# Patient Record
Sex: Male | Born: 1970 | Race: White | Hispanic: No | Smoking: Former smoker
Health system: Southern US, Community
[De-identification: ages and names within clinical notes are randomized; demographics above are authoritative.]

## PROBLEM LIST (undated history)

## (undated) DIAGNOSIS — F909 Attention-deficit hyperactivity disorder, unspecified type: Secondary | ICD-10-CM

## (undated) DIAGNOSIS — E291 Testicular hypofunction: Secondary | ICD-10-CM

## (undated) DIAGNOSIS — R0789 Other chest pain: Secondary | ICD-10-CM

## (undated) DIAGNOSIS — D696 Thrombocytopenia, unspecified: Secondary | ICD-10-CM

## (undated) DIAGNOSIS — B009 Herpesviral infection, unspecified: Secondary | ICD-10-CM

## (undated) DIAGNOSIS — M549 Dorsalgia, unspecified: Secondary | ICD-10-CM

## (undated) DIAGNOSIS — R0602 Shortness of breath: Secondary | ICD-10-CM

## (undated) DIAGNOSIS — Z720 Tobacco use: Secondary | ICD-10-CM

## (undated) DIAGNOSIS — M1990 Unspecified osteoarthritis, unspecified site: Secondary | ICD-10-CM

## (undated) HISTORY — DX: Other chest pain: R07.89

## (undated) HISTORY — DX: Testicular hypofunction: E29.1

## (undated) HISTORY — PX: BACK SURGERY: SHX140

## (undated) HISTORY — DX: Tobacco use: Z72.0

## (undated) HISTORY — PX: VASECTOMY: SHX75

---

## 2000-12-22 ENCOUNTER — Emergency Department (HOSPITAL_COMMUNITY): Admission: EM | Admit: 2000-12-22 | Discharge: 2000-12-22 | Payer: Self-pay | Admitting: Emergency Medicine

## 2000-12-22 ENCOUNTER — Encounter: Payer: Self-pay | Admitting: Emergency Medicine

## 2001-06-14 ENCOUNTER — Encounter: Admission: RE | Admit: 2001-06-14 | Discharge: 2001-06-14 | Payer: Self-pay | Admitting: Neurosurgery

## 2001-06-14 ENCOUNTER — Encounter: Payer: Self-pay | Admitting: Neurosurgery

## 2001-07-08 ENCOUNTER — Encounter: Admission: RE | Admit: 2001-07-08 | Discharge: 2001-07-08 | Payer: Self-pay | Admitting: Neurosurgery

## 2001-07-08 ENCOUNTER — Encounter: Payer: Self-pay | Admitting: Neurosurgery

## 2001-08-08 ENCOUNTER — Encounter: Payer: Self-pay | Admitting: Neurosurgery

## 2001-08-09 ENCOUNTER — Inpatient Hospital Stay (HOSPITAL_COMMUNITY): Admission: RE | Admit: 2001-08-09 | Discharge: 2001-08-11 | Payer: Self-pay | Admitting: Neurosurgery

## 2001-09-08 ENCOUNTER — Encounter: Payer: Self-pay | Admitting: Neurosurgery

## 2001-09-08 ENCOUNTER — Encounter: Admission: RE | Admit: 2001-09-08 | Discharge: 2001-09-08 | Payer: Self-pay | Admitting: Neurosurgery

## 2001-11-14 ENCOUNTER — Encounter: Admission: RE | Admit: 2001-11-14 | Discharge: 2001-11-14 | Payer: Self-pay | Admitting: Neurosurgery

## 2001-11-14 ENCOUNTER — Encounter: Payer: Self-pay | Admitting: Neurosurgery

## 2002-08-17 ENCOUNTER — Emergency Department (HOSPITAL_COMMUNITY): Admission: EM | Admit: 2002-08-17 | Discharge: 2002-08-17 | Payer: Self-pay | Admitting: *Deleted

## 2003-08-06 ENCOUNTER — Encounter: Admission: RE | Admit: 2003-08-06 | Discharge: 2003-08-06 | Payer: Self-pay | Admitting: Neurosurgery

## 2003-12-27 ENCOUNTER — Emergency Department (HOSPITAL_COMMUNITY): Admission: EM | Admit: 2003-12-27 | Discharge: 2003-12-27 | Payer: Self-pay | Admitting: Emergency Medicine

## 2006-05-21 ENCOUNTER — Emergency Department (HOSPITAL_COMMUNITY): Admission: EM | Admit: 2006-05-21 | Discharge: 2006-05-22 | Payer: Self-pay | Admitting: Emergency Medicine

## 2008-05-04 ENCOUNTER — Emergency Department (HOSPITAL_COMMUNITY): Admission: EM | Admit: 2008-05-04 | Discharge: 2008-05-04 | Payer: Self-pay | Admitting: Emergency Medicine

## 2008-07-18 ENCOUNTER — Emergency Department (HOSPITAL_COMMUNITY): Admission: EM | Admit: 2008-07-18 | Discharge: 2008-07-18 | Payer: Self-pay | Admitting: Emergency Medicine

## 2010-05-12 ENCOUNTER — Emergency Department (HOSPITAL_COMMUNITY)
Admission: EM | Admit: 2010-05-12 | Discharge: 2010-05-12 | Payer: Self-pay | Source: Home / Self Care | Admitting: Emergency Medicine

## 2010-07-09 ENCOUNTER — Emergency Department (HOSPITAL_COMMUNITY)
Admission: EM | Admit: 2010-07-09 | Discharge: 2010-07-09 | Disposition: A | Discharge status: Home / Self Care | Payer: Self-pay | Attending: Emergency Medicine | Admitting: Emergency Medicine

## 2010-07-09 DIAGNOSIS — R059 Cough, unspecified: Secondary | ICD-10-CM | POA: Insufficient documentation

## 2010-07-09 DIAGNOSIS — F17200 Nicotine dependence, unspecified, uncomplicated: Secondary | ICD-10-CM | POA: Insufficient documentation

## 2010-07-09 DIAGNOSIS — J40 Bronchitis, not specified as acute or chronic: Secondary | ICD-10-CM | POA: Insufficient documentation

## 2010-07-09 DIAGNOSIS — H7290 Unspecified perforation of tympanic membrane, unspecified ear: Secondary | ICD-10-CM | POA: Insufficient documentation

## 2010-07-09 DIAGNOSIS — R05 Cough: Secondary | ICD-10-CM | POA: Insufficient documentation

## 2010-07-23 LAB — DIFFERENTIAL
Basophils Absolute: 0 10*3/uL (ref 0.0–0.1)
Basophils Relative: 0 % (ref 0–1)
Eosinophils Absolute: 0.1 10*3/uL (ref 0.0–0.7)
Eosinophils Relative: 1 % (ref 0–5)
Lymphocytes Relative: 18 % (ref 12–46)
Lymphs Abs: 1 10*3/uL (ref 0.7–4.0)
Monocytes Absolute: 0.2 10*3/uL (ref 0.1–1.0)
Monocytes Relative: 5 % (ref 3–12)
Neutro Abs: 4.2 10*3/uL (ref 1.7–7.7)
Neutrophils Relative %: 77 % (ref 43–77)

## 2010-07-23 LAB — POCT CARDIAC MARKERS
CKMB, poc: <1 ng/mL — ABNORMAL LOW (ref 1.0–8.0)
Myoglobin, poc: 102 ng/mL (ref 12–200)
Troponin i, poc: <0.05 ng/mL (ref 0.00–0.09)

## 2010-07-23 LAB — CBC
HCT: 45.8 % (ref 39.0–52.0)
Hemoglobin: 15.8 g/dL (ref 13.0–17.0)
MCHC: 34.4 g/dL (ref 30.0–36.0)
MCV: 87.2 fL (ref 78.0–100.0)
Platelets: 134 10*3/uL — ABNORMAL LOW (ref 150–400)
RBC: 5.26 MIL/uL (ref 4.22–5.81)
RDW: 13.5 % (ref 11.5–15.5)
WBC: 5.4 10*3/uL (ref 4.0–10.5)

## 2010-07-23 LAB — POCT I-STAT, CHEM 8
BUN: 20 mg/dL (ref 6–23)
Calcium, Ion: 1.18 mmol/L (ref 1.12–1.32)
Chloride: 105 mEq/L (ref 96–112)
Creatinine, Ser: 1.3 mg/dL (ref 0.4–1.5)
Glucose, Bld: 107 mg/dL — ABNORMAL HIGH (ref 70–99)
HCT: 48 % (ref 39.0–52.0)
Hemoglobin: 16.3 g/dL (ref 13.0–17.0)
Potassium: 4.1 mEq/L (ref 3.5–5.1)
Sodium: 140 mEq/L (ref 135–145)
TCO2: 27 mmol/L (ref 0–100)

## 2010-07-28 LAB — BASIC METABOLIC PANEL
BUN: 14 mg/dL (ref 6–23)
CO2: 26 mEq/L (ref 19–32)
Calcium: 9 mg/dL (ref 8.4–10.5)
Chloride: 100 mEq/L (ref 96–112)
Creatinine, Ser: 0.9 mg/dL (ref 0.4–1.5)
GFR calc Af Amer: >60 mL/min (ref >60)
GFR calc non Af Amer: >60 mL/min (ref >60)
Glucose, Bld: 82 mg/dL (ref 70–99)
Potassium: 4.4 mEq/L (ref 3.5–5.1)
Sodium: 136 mEq/L (ref 135–145)

## 2010-08-18 ENCOUNTER — Emergency Department (HOSPITAL_COMMUNITY)
Admission: EM | Admit: 2010-08-18 | Discharge: 2010-08-18 | Disposition: A | Discharge status: Home / Self Care | Payer: Self-pay | Attending: Emergency Medicine | Admitting: Emergency Medicine

## 2010-08-18 DIAGNOSIS — M545 Low back pain, unspecified: Secondary | ICD-10-CM | POA: Insufficient documentation

## 2010-08-18 DIAGNOSIS — T148XXA Other injury of unspecified body region, initial encounter: Secondary | ICD-10-CM | POA: Insufficient documentation

## 2010-08-18 DIAGNOSIS — H6690 Otitis media, unspecified, unspecified ear: Secondary | ICD-10-CM | POA: Insufficient documentation

## 2010-08-18 DIAGNOSIS — J329 Chronic sinusitis, unspecified: Secondary | ICD-10-CM | POA: Insufficient documentation

## 2010-08-18 DIAGNOSIS — X58XXXA Exposure to other specified factors, initial encounter: Secondary | ICD-10-CM | POA: Insufficient documentation

## 2010-08-29 NOTE — Op Note (Signed)
[facility]  Patient:    Dillon Macdonald, Dillon Macdonald Visit Number: [id number] MRN: [id number]          Service Type: DSU Location: [id number] Attending Physician:  Donn Pierini Dictated by:   Julio Sicks, M.D. Proc. 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  POSTOPERATIVE DIAGNOSIS:  L3-4 and L4-5  herniated nucleus pulposus with stenosis and radiculopathy.  OPERATION PERFORMED:  L3-4 and L4-5 decompressive lumbar laminectomies with foraminotomies.  L3-4 and L4-5 bilateral microdiskectomies.  L3-4 and L4-5 posterior lumbar interbody fusion utilizing tangent wedges and local autograft.  L3 through L5 posterolateral fusion utilizing segmental pedicle screw instrumentation and local autograft.  SURGEON:  Julio Sicks, M.D.  ASSISTANT:  Donalee Citrin, Montez Hageman.  ANESTHESIA:  General endotracheal.  INDICATIONS FOR PROCEDURE:  Mr. Attwood is a 40 year old male with a history of severe back pain with bilateral lower extremity symptoms failing conservative management.  MRI scanning demonstrates annular tearing with diffuse disk bulges and stenosis at L3-4 and L4-5.  These had been confirmed by CT myelography as well as discography. The disk herniation at L4-5 has been documented to be progressing.  The patient was counseled as to his options. He has decided to proceed with two level lumbar decompression and fusion surgery in hopes of improving some of his symptoms.  DESCRIPTION OF PROCEDURE:  The patient was taken to the operating room and placed on the operating table in supine position.  After an adequate level of anesthesia was achieved, the patient was positioned prone onto a Wilson frame and appropriately padded.  The patients lumbar region was shaved and prepped sterilely.  A 10 blade was used to make a linear skin  incision overlying the L2, L3, L4 and L5 levels.  This was carried down sharply in the midline. Subperiosteal dissection was performed exposing the lamina and facet joints of L2, L3, L4 and L5 as well as the transverse processes of L3, L4 and L5.  Deep self-retaining retractor was placed.  Intraoperative fluoroscopy was used and the level was confirmed.  Decompressive laminectomy was then performed at L3-4 and L4-5 using Kerrison rongeurs, high speed drill and Leksell rongeurs to completely remove the lamina of L3 and L4 as well as the superior aspect of the lamina of L5.  All bone was cleaned and used in later autografting.  The inferior facetectomy was then performed at L3 and L4 as well as superior facetectomies at L4 and L5.  Once again, all bone was cleaned and used in layer autografting.  Ligament was then elevated at both levels and resected in piecemeal fashion using Kerrison rongeurs.  A wide foraminotomy was then performed along the cords of the exiting, L3, L4, and L5 nerve roots. Epidural venous plexus was coagulated and cut.  Starting first at L3-4 with the  thecal  sac and nerve roots protected, the disk space was then incised with a 15 blade in rectangular fashion.  A wide disk space clean out was then achieved using pituitary rongeurs, upward angled pituitary rongeurs and Epstein curets.  The procedure was then repeated on the contralateral side and bilaterally at L5-S1.  Attention then placed to interbody fusion.  Starting first at L3-4 with the disk space distracted up  to 12 mm, the disk space was then reamed and cut with 12 mm tangent instruments.  All soft tissue was cleaned from the interspace.  A 12 x 26 mm tangent wedge was then impacted into place and recessed approximately 1 mm from the anterior cortical surface. The procedure was then repeated on the contralateral side. Prior to installation of the second tangent wedge, morselized autograft was packed into the  interspace which had been well prepared.  The second tangent wedge fractured upon placement and a replacement was put into place without difficulty.  Attention was then placed down to L4-5.  10 mm tangent wedges were then placed bilaterally and once again, the interspace was packed with autograft.  There were no complications with this aspect of the procedure. The wound was then irrigated.  Pedicles of L3, L4 and L5 were then isolated using surface landmarks.  Each pedicle was then probed using a pedicle awl. This was done under fluoroscopic guidance.  Each pedicle awl tract was found to be solidly within bone.  Each pedicle awl tract was then tapped with 5.25 mm screw tap.  6.75 x 45 mm spiral 90 screws were then placed at L3 and L4 bilaterally.  6.74 x 40 mm screws were placed at L5 bilaterally.  All six screws were found to be solidly within bone.  The transverse processes of L3, L4 and L5 were then decorticated using the high speed drill.  Morselized autograft was packed posterolaterally.  Titanium rod was then contoured and placed over the screw  heads at L3, L4 and L5.  This was then attached to the rod.  The construct was placed under compression and screw heads were given a final tightening at all levels.  Final images revealed good position of bone graft and hardware at the proper operative level with normal alignment of the spine.  The wound was then irrigated with antibiotic solution.  Gelfoam was placed topically for hemostasis.  A medium Hemovac drain was left in the epidural space.  The wound was then closed in layers with Vicryl sutures. Steri-Strips and sterile dressing were applied.  There were no apparent complications.  The patient tolerated the procedure well and she returned to the recovery room postoperatively. Dictated by:   Julio Sicks, M.D. Attending Physician:  Donn Pierini DD:  08/08/01 TD:  08/08/01 Job: [id number]

## 2010-10-22 ENCOUNTER — Emergency Department (HOSPITAL_COMMUNITY)
Admission: EM | Admit: 2010-10-22 | Discharge: 2010-10-23 | Disposition: A | Discharge status: Home / Self Care | Payer: Self-pay | Attending: Emergency Medicine | Admitting: Emergency Medicine

## 2010-10-22 DIAGNOSIS — S90569A Insect bite (nonvenomous), unspecified ankle, initial encounter: Secondary | ICD-10-CM | POA: Insufficient documentation

## 2010-10-22 DIAGNOSIS — R21 Rash and other nonspecific skin eruption: Secondary | ICD-10-CM | POA: Insufficient documentation

## 2010-10-22 DIAGNOSIS — W57XXXA Bitten or stung by nonvenomous insect and other nonvenomous arthropods, initial encounter: Secondary | ICD-10-CM | POA: Insufficient documentation

## 2011-03-14 ENCOUNTER — Emergency Department (HOSPITAL_COMMUNITY)
Admission: EM | Admit: 2011-03-14 | Discharge: 2011-03-14 | Disposition: A | Discharge status: Home / Self Care | Payer: Self-pay | Attending: Emergency Medicine | Admitting: Emergency Medicine

## 2011-03-14 ENCOUNTER — Encounter: Payer: Self-pay | Admitting: Adult Health

## 2011-03-14 ENCOUNTER — Emergency Department (HOSPITAL_COMMUNITY): Payer: Self-pay

## 2011-03-14 DIAGNOSIS — M549 Dorsalgia, unspecified: Secondary | ICD-10-CM | POA: Insufficient documentation

## 2011-03-14 DIAGNOSIS — G8929 Other chronic pain: Secondary | ICD-10-CM | POA: Insufficient documentation

## 2011-03-14 DIAGNOSIS — Z981 Arthrodesis status: Secondary | ICD-10-CM | POA: Insufficient documentation

## 2011-03-14 MED ORDER — DIAZEPAM 5 MG PO TABS: 5.0000 mg | ORAL_TABLET | Freq: Two times a day (BID) | ORAL | Status: AC

## 2011-03-14 MED ORDER — HYDROCODONE-ACETAMINOPHEN 5-325 MG PO TABS: 2.0000 | ORAL_TABLET | ORAL | Status: AC | PRN

## 2011-03-14 MED ORDER — DIAZEPAM 5 MG PO TABS: 5.0000 mg | ORAL_TABLET | Freq: Once | ORAL | Status: DC

## 2011-03-14 MED ORDER — IBUPROFEN 800 MG PO TABS: 800.0000 mg | ORAL_TABLET | Freq: Three times a day (TID) | ORAL | Status: AC

## 2011-03-14 MED ORDER — KETOROLAC TROMETHAMINE 60 MG/2ML IM SOLN
60.0000 mg | Freq: Once | INTRAMUSCULAR | Status: AC
  Administered 2011-03-14: 60 mg via INTRAMUSCULAR
  Filled 2011-03-14: qty 2

## 2011-03-14 NOTE — ED Notes (Signed)
Lumbar and mid back pain radiates down legs for 7 days.

## 2011-03-14 NOTE — ED Provider Notes (Signed)
History     CSN: [id number] Arrival date & time: 03/14/2011  8:58 AM   First MD Initiated Contact with Patient 03/14/11 [phone]      Chief Complaint  Patient presents with  . Back Pain    (Consider location/radiation/quality/duration/timing/severity/associated sxs/prior treatment) HPI Comments: Patient presents to the chief complaint of lumbar and thoracic back pain.  His pain is chronic in nature and he had a fusion of his T-spine performed by Dr. pool.  Patient states that he has pain radiating down both of his legs.  He denies numbness and is able to ambulate.  Patient also denies saddle paresthesias, urinary retention, and inability to control his bowel and bladder.  Patient states oxycodone makes him sick and does not want that pain medication.  Patient is a 40 y.o. male presenting with back pain. The history is provided by the patient.  Back Pain  Pertinent negatives include no chest pain, no fever, no numbness, no headaches, no abdominal pain and no weakness.    History reviewed. No pertinent past medical history.  Past Surgical History  Procedure Date  . Back surgery     History reviewed. No pertinent family history.  History  Substance Use Topics  . Smoking status: Current Everyday Smoker  . Smokeless tobacco: Not on file  . Alcohol Use: Yes      Review of Systems  Constitutional: Positive for activity change. Negative for fever, chills, fatigue and unexpected weight change.  HENT: Negative for neck pain and neck stiffness.   Eyes: Negative for visual disturbance.  Respiratory: Negative for shortness of breath.   Cardiovascular: Negative for chest pain and leg swelling.  Gastrointestinal: Negative for nausea, abdominal pain, constipation and rectal pain.  Genitourinary: Negative for urgency and difficulty urinating.       Patient denies bowel and bladder incontinence.  Musculoskeletal: Positive for back pain and gait problem. Negative for myalgias, joint swelling  and arthralgias.  Neurological: Negative for weakness, numbness and headaches.  All other systems reviewed and are negative.    Allergies  Review of patient's allergies indicates no known allergies.  Home Medications   Current Outpatient Rx  Name Route Sig Dispense Refill  . ASPIRIN 325 MG PO TBEC Oral Take 650 mg by mouth daily.        BP 130/86  Pulse 99  Temp(Src) 98.1 F (36.7 C) (Oral)  Resp 20  SpO2 99%  Physical Exam  Constitutional: He is oriented to person, place, and time. He appears well-developed and well-nourished. No distress.  HENT:  Head: Normocephalic and atraumatic.  Eyes: Conjunctivae and EOM are normal. Pupils are equal, round, and reactive to light. No scleral icterus.  Neck: Normal range of motion and full passive range of motion without pain. Neck supple. No tracheal tenderness, no spinous process tenderness and no muscular tenderness present. Carotid bruit is not present. No Brudzinski's sign noted. No mass and no thyromegaly present.  Cardiovascular: Normal rate, regular rhythm and intact distal pulses.  Exam reveals no gallop and no friction rub.   No murmur heard. Pulmonary/Chest: Effort normal and breath sounds normal. No stridor. No respiratory distress. He has no wheezes. He has no rales. He exhibits no tenderness.  Abdominal: Soft. Bowel sounds are normal.  Musculoskeletal:       Cervical back: He exhibits normal range of motion, no tenderness, no bony tenderness and no pain.       Thoracic back: He exhibits tenderness and bony tenderness. He exhibits no pain.  Lumbar back: He exhibits tenderness, bony tenderness and pain. He exhibits no spasm and normal pulse.       Right foot: He exhibits no swelling.       Left foot: He exhibits no swelling.       Pt has increased pain w ROM of lumbar spine. Pain w ambulation.   Neurological: He is alert and oriented to person, place, and time. He has normal strength. No sensory deficit.       2pt   discrimination intact of lower extremities.  No footdrop.  Pain with flexion and extension of back.  Distal pulses intact.  Skin: Skin is warm and dry. No rash noted. He is not diaphoretic. No erythema. No pallor.  Psychiatric: He has a normal mood and affect.    ED Course  Procedures (including critical care time)  Labs Reviewed - No data to display No results found.   No diagnosis found.  Patient advised to followup with Dr. Dutch Quint his orthopedic surgeon who performed the fusion of his lower lumbar.  Patient states his pain was relieved partially with a Toradol shot however he is driving home and did not want any narcotics.     MDM  Chronic Back Pain         Jaci Carrel, [place] 03/14/11 1122

## 2011-03-14 NOTE — ED Provider Notes (Signed)
Medical screening examination/treatment/procedure(s) were performed by non-physician practitioner and as supervising physician I was immediately available for consultation/collaboration.  Nicholes Stairs, MD 03/14/11 [phone]

## 2012-04-13 DIAGNOSIS — D696 Thrombocytopenia, unspecified: Secondary | ICD-10-CM

## 2012-04-13 HISTORY — PX: CERVICAL FUSION: SHX112

## 2012-04-13 HISTORY — DX: Thrombocytopenia, unspecified: D69.6

## 2012-11-18 ENCOUNTER — Telehealth: Payer: Self-pay

## 2012-11-18 ENCOUNTER — Telehealth: Payer: Self-pay | Admitting: Hematology and Oncology

## 2012-11-18 NOTE — Telephone Encounter (Signed)
C/D 11/18/12 for appt. 12/06/12

## 2012-11-18 NOTE — Telephone Encounter (Signed)
S/W PT IN RE TO NP APPT 08/26 @ 3 W/DR. VICTOR REFERRING DR. Rockney Ghee SHAW DX- LOW PLTS WELCOME PACKET MAILED

## 2012-12-06 ENCOUNTER — Ambulatory Visit (HOSPITAL_BASED_OUTPATIENT_CLINIC_OR_DEPARTMENT_OTHER): Payer: BC Managed Care – PPO | Admitting: Hematology and Oncology

## 2012-12-06 ENCOUNTER — Ambulatory Visit: Payer: BC Managed Care – PPO

## 2012-12-06 VITALS — BP 128/91 | HR 79 | Temp 97.5°F | Resp 18 | Ht 67.5 in | Wt 188.8 lb

## 2012-12-06 DIAGNOSIS — D696 Thrombocytopenia, unspecified: Secondary | ICD-10-CM

## 2012-12-06 NOTE — Progress Notes (Signed)
ID: [name] OB: 1971/01/30  MR#: [id number]  WUJ#:[id number]  [facility]  [phone] [phone]   INITIAL HEMATOLOGY CONSULTATION   PCP: REDMON,NOELLE, PA-C  Reason for Referral: Thrombocytopenia    HPI: The patient is a 42 y.o male who was send to the clinic because of thrombocytopenia. 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 Because of back pain patient was for surgery and presented today for clearance from hematology. The patient fells fatigue  all the time. His appetite is good and weight is stable. Patient reported nasal congestion, diminished hearing, weakness,pain in arms and hand, elbows, shoulders.  He also has numbness in hands and right leg. The patient denied fever, chills, night sweats. He denied headaches, double vision, blurry vision, nasal discharge,  odynophagia or dysphagia. No chest pain, palpitations, dyspnea, cough, abdominal pain, nausea, vomiting, diarrhea, constipation, hematochezia. The patient denied dysuria, nocturia, polyuria, hematuria, myalgia, tingling, psychiatric problems.  Review of Systems  Constitutional: Positive for malaise/fatigue. Negative for fever, chills, weight loss and diaphoresis.  HENT: Positive for congestion. Negative for hearing loss, nosebleeds, sore throat, neck pain and tinnitus.   Eyes: Negative for blurred vision, double vision, photophobia and pain.  Respiratory: Negative for cough, hemoptysis, sputum production, shortness of breath and wheezing.   Cardiovascular: Negative for chest pain, palpitations, orthopnea, claudication, leg swelling and PND.  Gastrointestinal: Negative for heartburn, nausea, vomiting, abdominal pain,  diarrhea, constipation, blood in stool and melena.  Genitourinary: Negative for dysuria, urgency, frequency, hematuria and flank pain.  Musculoskeletal: Positive for myalgias and joint pain. Negative for back pain.  Skin: Negative for itching and rash.  Neurological: Positive for sensory change and weakness. Negative for dizziness, tingling, tremors, speech change, focal weakness, seizures, loss of consciousness and headaches.  Endo/Heme/Allergies: Does not bruise/bleed easily.  Psychiatric/Behavioral: Negative.     PAST MEDICAL HISTORY: No past medical history on file. HSV infection Back pain. Erection problem  PAST SURGICAL HISTORY: Past Surgical History  Procedure Laterality Date  . Back surgery    Vasectomy.  FAMILY HISTORY No family history on file. Mother: DM, HTN, Hypercholesteremia. Father: DM, HTN, CVA. Brother DM, HTN, CAD, Hypercholesteremia. Brother HTN, Hypercholesteremia. Sister: O>K>  HEALTH MAINTENANCE: History  Substance Use Topics  . Smoking status: Current Every Day Smoker  . Smokeless tobacco: Not on file  . Alcohol Use: Yes  Smoke 2 PPD.  No Known Allergies  Current Outpatient Prescriptions  Medication Sig Dispense Refill  . aspirin 325 MG EC tablet Take 650 mg by mouth daily.        Marland Kitchen HYDROcodone-acetaminophen (NORCO/VICODIN) 5-325 MG per tablet Take 1 tablet by mouth 2 (two) times daily as needed for pain.       No current facility-administered medications for this visit.    OBJECTIVE: Filed Vitals:   12/06/12 1622  BP: 128/91  Pulse: 79  Temp: 97.5 F (36.4 C)  Resp: 18     Body mass index is 29.12 kg/(m^2).    ECOG FS: 0  PHYSICAL EXAMINATION:  HEENT: Sclerae anicteric.  Conjunctivae were pink. Pupils round and reactive bilaterally. Oral mucosa is moist without ulceration or thrush. No occipital, submandibular, cervical, supraclavicular or axillar adenopathy. Lungs: clear  to auscultation without wheezes. No rales or rhonchi. Heart:  regular rate and rhythm. No murmur, gallop or rubs. Abdomen: soft, non tender. No guarding or rebound tenderness. Bowel sounds are present. No palpable hepatosplenomegaly. MSK: no focal spinal tenderness. Extremities: No clubbing or cyanosis.No calf tenderness to palpitation, no peripheral edema. The patient had grossly intact strength in upper and lower extremities. Skin exam was without ecchymosis, petechiae. Neuro: non-focal, alert and oriented to time, person and place, appropriate affect  LAB RESULTS:  CMP     Component Value Date/Time   NA 140 07/18/2008 1215   K 4.1 07/18/2008 1215   CL 105 07/18/2008 1215   CO2 26 05/04/2008 1730   GLUCOSE 107* 07/18/2008 1215   BUN 20 07/18/2008 1215   CREATININE 1.3 07/18/2008 1215   CALCIUM 9.0 05/04/2008 1730   GFRNONAA >60 05/04/2008 1730   GFRAA  Value: >60        The eGFR has been calculated using the MDRD equation. This calculation has not been validated in all clinical situations. eGFR's persistently <60 mL/min signify possible Chronic Kidney Disease. 05/04/2008 1730    Lab Results  Component Value Date   WBC 5.4 07/18/2008   NEUTROABS 4.2 07/18/2008   HGB 16.3 07/18/2008   HCT 48.0 07/18/2008   MCV 87.2 07/18/2008   PLT 134* 07/18/2008      Chemistry      Component Value Date/Time   NA 140 07/18/2008 1215   K 4.1 07/18/2008 1215   CL 105 07/18/2008 1215   CO2 26 05/04/2008 1730   BUN 20 07/18/2008 1215   CREATININE 1.3 07/18/2008 1215      Component Value Date/Time   CALCIUM 9.0 05/04/2008 1730      ASSESSMENT AND PLAN:  1. Thrombocytopenia. Probably temporary secondary to infection and, may be partially to medications. We will repeat CBC today and if it will be abnormal we will check serum folate, vitamin B12, ferritin, iron,TIBC. I did not felt spleen but we will do [place] to r/o splenomegaly. I will se patient in 2 weeks.   Myra Rude, MD   12/06/2012 5:23 PM

## 2012-12-08 ENCOUNTER — Ambulatory Visit (HOSPITAL_BASED_OUTPATIENT_CLINIC_OR_DEPARTMENT_OTHER): Payer: BC Managed Care – PPO | Admitting: Lab

## 2012-12-08 ENCOUNTER — Telehealth: Payer: Self-pay | Admitting: Hematology and Oncology

## 2012-12-08 ENCOUNTER — Ambulatory Visit (HOSPITAL_COMMUNITY)
Admission: RE | Admit: 2012-12-08 | Discharge: 2012-12-08 | Disposition: A | Discharge status: Home / Self Care | Payer: BC Managed Care – PPO | Source: Ambulatory Visit | Attending: Hematology and Oncology | Admitting: Hematology and Oncology

## 2012-12-08 DIAGNOSIS — D696 Thrombocytopenia, unspecified: Secondary | ICD-10-CM

## 2012-12-08 LAB — CBC WITH DIFFERENTIAL/PLATELET
BASO%: 1.1 % (ref 0.0–2.0)
Basophils Absolute: 0.1 10*3/uL (ref 0.0–0.1)
EOS%: 3.5 % (ref 0.0–7.0)
Eosinophils Absolute: 0.2 10*3/uL (ref 0.0–0.5)
HCT: 44.4 % (ref 38.4–49.9)
HGB: 15.2 g/dL (ref 13.0–17.1)
LYMPH%: 33.4 % (ref 14.0–49.0)
MCH: 30.6 pg (ref 27.2–33.4)
MCHC: 34.3 g/dL (ref 32.0–36.0)
MCV: 89.4 fL (ref 79.3–98.0)
MONO#: 0.4 10*3/uL (ref 0.1–0.9)
MONO%: 7.3 % (ref 0.0–14.0)
NEUT#: 2.7 10*3/uL (ref 1.5–6.5)
NEUT%: 54.7 % (ref 39.0–75.0)
Platelets: 153 10*3/uL (ref 140–400)
RBC: 4.96 10*6/uL (ref 4.20–5.82)
RDW: 13.6 % (ref 11.0–14.6)
WBC: 4.9 10*3/uL (ref 4.0–10.3)
lymph#: 1.6 10*3/uL (ref 0.9–3.3)

## 2012-12-08 LAB — IRON AND TIBC CHCC
%SAT: 36 % (ref 20–55)
Iron: 109 ug/dL (ref 42–163)
TIBC: 306 ug/dL (ref 202–409)
UIBC: 197 ug/dL (ref 117–376)

## 2012-12-08 LAB — VITAMIN B12: Vitamin B-12: 564 pg/mL (ref 211–911)

## 2012-12-08 LAB — FERRITIN CHCC: Ferritin: 145 ng/ml (ref 22–316)

## 2012-12-08 LAB — FOLATE: Folate: >20 ng/mL

## 2012-12-08 NOTE — Telephone Encounter (Signed)
Pt came by today fo draw labs , CAmeo aware  that pt is here

## 2012-12-08 NOTE — Telephone Encounter (Signed)
s.w. pt and advised on 2week appts....pt ok and aware

## 2012-12-18 ENCOUNTER — Encounter (HOSPITAL_COMMUNITY): Payer: Self-pay

## 2012-12-18 ENCOUNTER — Emergency Department (HOSPITAL_COMMUNITY)
Admission: EM | Admit: 2012-12-18 | Discharge: 2012-12-18 | Disposition: A | Discharge status: Home / Self Care | Payer: BC Managed Care – PPO | Attending: Emergency Medicine | Admitting: Emergency Medicine

## 2012-12-18 DIAGNOSIS — Z8739 Personal history of other diseases of the musculoskeletal system and connective tissue: Secondary | ICD-10-CM | POA: Insufficient documentation

## 2012-12-18 DIAGNOSIS — S0120XA Unspecified open wound of nose, initial encounter: Secondary | ICD-10-CM | POA: Insufficient documentation

## 2012-12-18 DIAGNOSIS — S0121XA Laceration without foreign body of nose, initial encounter: Secondary | ICD-10-CM

## 2012-12-18 DIAGNOSIS — IMO0002 Reserved for concepts with insufficient information to code with codable children: Secondary | ICD-10-CM | POA: Insufficient documentation

## 2012-12-18 DIAGNOSIS — Z7982 Long term (current) use of aspirin: Secondary | ICD-10-CM | POA: Insufficient documentation

## 2012-12-18 DIAGNOSIS — Y929 Unspecified place or not applicable: Secondary | ICD-10-CM | POA: Insufficient documentation

## 2012-12-18 DIAGNOSIS — F17200 Nicotine dependence, unspecified, uncomplicated: Secondary | ICD-10-CM | POA: Insufficient documentation

## 2012-12-18 DIAGNOSIS — Y9389 Activity, other specified: Secondary | ICD-10-CM | POA: Insufficient documentation

## 2012-12-18 HISTORY — DX: Dorsalgia, unspecified: M54.9

## 2012-12-18 MED ORDER — HYDROCODONE-ACETAMINOPHEN 5-325 MG PO TABS
2.0000 | ORAL_TABLET | Freq: Once | ORAL | Status: AC
Start: 2012-12-18 — End: 2012-12-18
  Administered 2012-12-18: 2 via ORAL
  Filled 2012-12-18: qty 2

## 2012-12-18 MED ORDER — ONDANSETRON HCL 4 MG PO TABS: 4.0000 mg | ORAL_TABLET | Freq: Four times a day (QID) | ORAL | Status: DC

## 2012-12-18 MED ORDER — ONDANSETRON 8 MG PO TBDP
8.0000 mg | ORAL_TABLET | Freq: Once | ORAL | Status: AC
  Administered 2012-12-18: 8 mg via ORAL
  Filled 2012-12-18: qty 1

## 2012-12-18 MED ORDER — OXYCODONE-ACETAMINOPHEN 5-325 MG PO TABS: 2.0000 | ORAL_TABLET | Freq: Four times a day (QID) | ORAL | Status: DC | PRN

## 2012-12-18 NOTE — ED Notes (Signed)
Pt reports accidental contact with metal object to face cutting nose. 1/2 inch vertical laceration to the left fold of nose EDP Lynelle Doctor present to evaluate present laceration. Bleeding controlled

## 2012-12-18 NOTE — ED Notes (Signed)
MD at bedside.  EDPA Dahlia Client present at bs applying dermabond to left nostril

## 2012-12-18 NOTE — ED Provider Notes (Signed)
Medical screening examination/treatment/procedure(s) were conducted as a shared visit with non-physician practitioner(s) and myself.  I personally evaluated the patient during the encounter  SMall laceration nasolabial fold.  Well approximated.  Laceration is less than 1 cm curvilinear at nasolabial fold.  Will dermabond for wound closure.  Celene Kras, MD 12/18/12 [phone]

## 2012-12-18 NOTE — ED Provider Notes (Signed)
CSN: [id number]     Arrival date & time 12/18/12  1457 History  This chart was scribed for non-physician practitioner Junious Silk, working with Linwood Dibbles, MD by Carl Best, ED Scribe. This patient was seen in room WTR5/WTR5 and the patient's care was started at 3:31 PM.   Chief Complaint  Patient presents with  . Facial Laceration    Patient is a 42 y.o. male presenting with skin laceration. The history is provided by the patient. No language interpreter was used.  Laceration Location:  Face Facial laceration location:  Nose Depth:  Cutaneous Bleeding: controlled   Time since incident:  8 hours Injury mechanism: pliers. Pain details:    Severity:  Mild   Progression:  Unchanged Foreign body present:  No foreign bodies Worsened by:  Nothing tried Ineffective treatments:  None tried Tetanus status:  Up to date  HPI Comments: Dillon Macdonald is a 42 y.o. male who presents to the Emergency Department complaining a laceration to his left nare that occurred at 10:30 AM this morning.  He stated that he was pulling on a spring with a pair of pliers and the pliers hit him directly in the face.  He stated that there is associated soreness and pressure around the left nare and the top of his braces.  He denied any dental injury or pain in the bridge of his nose.  He stated that he was seen at a [facility] and was advised to come to the ED to see a Careers adviser.  Patient stated that his last TD vaccination was about a month ago.    Past Medical History  Diagnosis Date  . Back pain    Past Surgical History  Procedure Laterality Date  . Back surgery     No family history on file. History  Substance Use Topics  . Smoking status: Current Every Day Smoker  . Smokeless tobacco: Not on file  . Alcohol Use: Yes    Review of Systems  HENT: Negative for sinus pressure.        Pressure around the left nare and face  Skin: Positive for wound (left nare).  All other systems reviewed and are  negative.    Allergies  Review of patient's allergies indicates no known allergies.  Home Medications   Current Outpatient Rx  Name  Route  Sig  Dispense  Refill  . aspirin 325 MG EC tablet   Oral   Take 650 mg by mouth daily.           Marland Kitchen HYDROcodone-acetaminophen (NORCO/VICODIN) 5-325 MG per tablet   Oral   Take 1 tablet by mouth 2 (two) times daily as needed for pain.         Marland Kitchen ondansetron (ZOFRAN) 4 MG tablet   Oral   Take 1 tablet (4 mg total) by mouth every 6 (six) hours.   12 tablet   0   . oxyCODONE-acetaminophen (PERCOCET/ROXICET) 5-325 MG per tablet   Oral   Take 2 tablets by mouth every 6 (six) hours as needed for pain.   12 tablet   0    Triage Vitals: BP 135/85  Pulse 92  Temp(Src) 99.2 F (37.3 C) (Oral)  Resp 16  Ht 5\' 8"  (1.727 m)  Wt 188 lb (85.276 kg)  BMI 28.59 kg/m2  SpO2 99%  Physical Exam  Nursing note and vitals reviewed. Constitutional: He is oriented to person, place, and time. He appears well-developed and well-nourished. No distress.  HENT:  Head: Normocephalic.  Right Ear: External ear normal.  Left Ear: External ear normal.  Laceration to the crease of the left nare.  No gum lacerations, broken or loose teeth.  No tenderness to palpation at the bridge of the nose, maxillary or frontal sinuses.  Eyes: Conjunctivae and EOM are normal. Pupils are equal, round, and reactive to light.  Neck: Normal range of motion and phonation normal. Neck supple.  Pulmonary/Chest: He exhibits no bony tenderness.  Abdominal: Normal appearance.  Musculoskeletal: Normal range of motion.  Neurological: He is alert and oriented to person, place, and time. He has normal strength. No sensory deficit.  Skin: Skin is warm, dry and intact. He is not diaphoretic.  Psychiatric: He has a normal mood and affect. His behavior is normal. Judgment and thought content normal.    ED Course  Procedures (including critical care time)  DIAGNOSTIC STUDIES: Oxygen  Saturation is 99% on room air, normal by my interpretation.    COORDINATION OF CARE: 3:39 PM- Discussed the absence of hematoma around the area and cleaning up the effected area before discharging.  Patient agreed to treatment plan.   3:40 PM- Supervising physician, Linwood Dibbles, spoke with patient and advised that surgery consult will not be necessary.    LACERATION REPAIR Performed by: Junious Silk Authorized by: Junious Silk Consent: Verbal consent obtained. Risks and benefits: risks, benefits and alternatives were discussed Consent given by: patient Patient identity confirmed: provided demographic data Prepped and Draped in normal sterile fashion Wound explored  Laceration Location: left nare  Laceration Length: .5 cm  No Foreign Bodies seen or palpated  Anesthesia: none  Irrigation method: syringe Amount of cleaning: standard  Skin closure: dermabond   Patient tolerance: Patient tolerated the procedure well with no immediate complications.   Medications  HYDROcodone-acetaminophen (NORCO/VICODIN) 5-325 MG per tablet 2 tablet (not administered)  ondansetron (ZOFRAN-ODT) disintegrating tablet 8 mg (not administered)    Labs Review Labs Reviewed - No data to display Imaging Review No results found.  MDM   1. Nasal laceration, initial encounter    Tdap booster UTD. Wound cleaning complete with pressure irrigation, bottom of wound visualized, no foreign bodies appreciated. Nasal fold intact. Laceration occurred < 8 hours prior to repair which was well tolerated. Pt has no co morbidities to effect normal wound healing. Discussed skin adhesive home care w pt and answered questions. F/u with pcp. Pt is hemodynamically stable w no complaints prior to dc.    I personally performed the services described in this documentation, which was scribed in my presence. The recorded information has been reviewed and is accurate.     Mora Bellman, PA-C 12/27/12 [phone]

## 2012-12-21 ENCOUNTER — Ambulatory Visit (HOSPITAL_BASED_OUTPATIENT_CLINIC_OR_DEPARTMENT_OTHER): Payer: BC Managed Care – PPO | Admitting: Hematology and Oncology

## 2012-12-21 VITALS — BP 128/88 | HR 73 | Temp 97.9°F | Resp 18 | Ht 68.0 in | Wt 189.1 lb

## 2012-12-21 DIAGNOSIS — D696 Thrombocytopenia, unspecified: Secondary | ICD-10-CM

## 2012-12-21 NOTE — Progress Notes (Signed)
ID: Dillon Macdonald OB: 1970/11/25  MR#: [id number]  WUJ#:[id number]   [facility]  Telephone:(336) [phone] Fax:(336) [phone]   OFFICE PROGRESS NOTE  PCP: REDMON,NOELLE, PA-C  DIAGNOSIS: History of thrombocytopenia   HISTORY OF PRESENT ILLNESS: The patient is a 42 y.o male who was send to the clinic because of thrombocytopenia. 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 He was started on valacyclovir, Voltaren and Viagra. Second CBC on 08/04/20143 was also normal except platelets count 129,000. Because of back pain patient was planned for surgery. He presented on 12/06/12 for clearance from hematology. He presented today for follow up visit.  INTERVAL HISTORY: Dillon Macdonald is a 43 y.o. Man who presented for follow up visit. His appetite is good but he feels tired. He has headache all the time. He reported nasal congestion. Patient has dry cough which is improving. He complains on pain all over, numbness in hands and right leg. He feels weak. The patient denied fever, chills, night sweats, change in appetite or weight. He denied double vision, blurry vision, nasal discharge, hearing problems, odynophagia or dysphagia. No chest pain, palpitations, dyspnea, cough, abdominal pain, nausea, vomiting, diarrhea, constipation, hematochezia. The patient denied dysuria, nocturia, polyuria, hematuria, myalgia, tingling, psychiatric problems.  Review of Systems  Constitutional: Positive for malaise/fatigue. Negative for fever, chills, weight loss and diaphoresis.  HENT: Positive for neck pain. Negative for hearing loss, nosebleeds, congestion, sore throat and tinnitus.   Eyes: Negative for blurred vision, double vision, photophobia and pain.  Respiratory: Negative for cough, hemoptysis, sputum production, shortness of breath and wheezing.     Cardiovascular: Negative for chest pain, palpitations, orthopnea, claudication, leg swelling and PND.  Gastrointestinal: Negative for heartburn, nausea, vomiting, abdominal pain, diarrhea, constipation, blood in stool and melena.  Genitourinary: Negative for dysuria, urgency, frequency, hematuria and flank pain.  Musculoskeletal: Positive for back pain and joint pain. Negative for myalgias and falls.  Skin: Negative for itching and rash.  Neurological: Positive for sensory change, weakness and headaches. Negative for dizziness, tingling, tremors, speech change, focal weakness, seizures and loss of consciousness.  Endo/Heme/Allergies: Does not bruise/bleed easily.  Psychiatric/Behavioral: Negative.    PAST MEDICAL HISTORY: Past Medical History  Diagnosis Date  . Back pain   HSV infection  Erection problem   PAST SURGICAL HISTORY: Past Surgical History  Procedure Laterality Date  . Back surgery    Vasectomy.  FAMILY HISTORY No family history on file. Mother: DM, HTN, Hypercholesteremia.  Father: DM, HTN, CVA.  Brother DM, HTN, CAD, Hypercholesteremia.  Brother HTN, Hypercholesteremia.  Sister: O>K>  HEALTH MAINTENANCE: History  Substance Use Topics  . Smoking status: Current Every Day Smoker  . Smokeless tobacco: Not on file  . Alcohol Use: Yes   No Known Allergies  Current Outpatient Prescriptions  Medication Sig Dispense Refill  . aspirin 325 MG EC tablet Take 650 mg by mouth daily.        Marland Kitchen HYDROcodone-acetaminophen (NORCO/VICODIN) 5-325 MG per tablet Take 1 tablet by mouth 2 (two) times daily as needed for pain.      Marland Kitchen ondansetron (ZOFRAN) 4 MG tablet Take 1 tablet (4 mg total) by mouth every 6 (six) hours.  12 tablet  0  . oxyCODONE-acetaminophen (PERCOCET/ROXICET) 5-325 MG per tablet Take 2 tablets by mouth every 6 (six) hours as needed for pain.  12 tablet  0   No current facility-administered medications for this visit.    OBJECTIVE: Filed Vitals:    12/21/12 1329  BP: 128/88  Pulse: 73  Temp: 97.9 F (36.6 C)  Resp: 18     Body mass index is 28.76 kg/(m^2).    ECOG FS:0  PHYSICAL EXAMINATION:  HEENT: Sclerae anicteric.  Conjunctivae were pink. Pupils round and reactive bilaterally. Oral mucosa is moist without ulceration or thrush. No occipital, submandibular, cervical, supraclavicular or axillar adenopathy. Lungs: clear to auscultation without wheezes. No rales or rhonchi. Heart: regular rate and rhythm. No murmur, gallop or rubs. Abdomen: soft, non tender. No guarding or rebound tenderness. Bowel sounds are present. No palpable hepatosplenomegaly. MSK: no focal spinal tenderness. Extremities: No clubbing or cyanosis.No calf tenderness to palpitation, no peripheral edema. The patient had grossly intact strength in upper and lower extremities. Skin exam was without ecchymosis, petechiae. Neuro: non-focal, alert and oriented to time, person and place, appropriate affect  LAB RESULTS:  CMP     Component Value Date/Time   NA 140 07/18/2008 1215   K 4.1 07/18/2008 1215   CL 105 07/18/2008 1215   CO2 26 05/04/2008 1730   GLUCOSE 107* 07/18/2008 1215   BUN 20 07/18/2008 1215   CREATININE 1.3 07/18/2008 1215   CALCIUM 9.0 05/04/2008 1730   GFRNONAA >60 05/04/2008 1730   GFRAA  Value: >60        The eGFR has been calculated using the MDRD equation. This calculation has not been validated in all clinical situations. eGFR's persistently <60 mL/min signify possible Chronic Kidney Disease. 05/04/2008 1730    Lab Results  Component Value Date   WBC 4.9 12/08/2012   NEUTROABS 2.7 12/08/2012   HGB 15.2 12/08/2012   HCT 44.4 12/08/2012   MCV 89.4 12/08/2012   PLT 153 12/08/2012      Chemistry      Component Value Date/Time   NA 140 07/18/2008 1215   K 4.1 07/18/2008 1215   CL 105 07/18/2008 1215   CO2 26 05/04/2008 1730   BUN 20 07/18/2008 1215   CREATININE 1.3 07/18/2008 1215      Component Value Date/Time   CALCIUM 9.0 05/04/2008 1730       STUDIES: US Abdomen Limited  12/08/2012   *RADIOLOGY REPORT*  Clinical Data:  History of thrombocytopenia.  Evaluation of spleen.  LIMITED ABDOMINAL ULTRASOUND - RIGHT UPPER QUADRANT  Comparison:  None  Findings:  Spleen: The spleen measured 12.4 x 8.3 x 5.2 cm.  Volume is 281 ml. This is within normal range.  No focal splenic abnormality is seen.  IMPRESSION:   Normal size of spleen.  Volume is 281 ml.  No focal splenic abnormality evident.   Original Report Authenticated By: Onalee Hua Call    ASSESSMENT AND PLAN: 1. History of thrombocytopenia. Probably secondary to infection and medication, Last CBC was completely normal. Workup: serum folate, vitamin B12, iron study - all normal. No splenomegaly He can go for surgery. No need to follow with [place].  Myra Rude, MD   12/21/2012 3:08 PM

## 2012-12-28 NOTE — ED Provider Notes (Signed)
Medical screening examination/treatment/procedure(s) were performed by non-physician practitioner and as supervising physician I was immediately available for consultation/collaboration.    Keigo Whalley R Aaima Gaddie, MD 12/28/12 1633 

## 2013-03-20 ENCOUNTER — Other Ambulatory Visit: Payer: Self-pay | Admitting: Neurosurgery

## 2013-03-20 DIAGNOSIS — M48061 Spinal stenosis, lumbar region without neurogenic claudication: Secondary | ICD-10-CM

## 2013-03-31 ENCOUNTER — Ambulatory Visit
Admission: RE | Admit: 2013-03-31 | Discharge: 2013-03-31 | Disposition: A | Discharge status: Home / Self Care | Payer: BC Managed Care – PPO | Source: Ambulatory Visit | Attending: Neurosurgery | Admitting: Neurosurgery

## 2013-03-31 VITALS — BP 111/77 | HR 83

## 2013-03-31 DIAGNOSIS — M48061 Spinal stenosis, lumbar region without neurogenic claudication: Secondary | ICD-10-CM

## 2013-03-31 MED ORDER — DIAZEPAM 5 MG PO TABS
10.0000 mg | ORAL_TABLET | Freq: Once | ORAL | Status: AC
  Administered 2013-03-31: 10 mg via ORAL

## 2013-03-31 MED ORDER — IOHEXOL 300 MG/ML  SOLN
10.0000 mL | Freq: Once | INTRAMUSCULAR | Status: AC | PRN
  Administered 2013-03-31: 10 mL via INTRATHECAL

## 2013-03-31 MED ORDER — ONDANSETRON HCL 4 MG/2ML IJ SOLN: 4.0000 mg | Freq: Four times a day (QID) | INTRAMUSCULAR | Status: DC | PRN

## 2013-03-31 MED ORDER — OXYCODONE-ACETAMINOPHEN 5-325 MG PO TABS
2.0000 | ORAL_TABLET | Freq: Once | ORAL | Status: AC
  Administered 2013-03-31: 2 via ORAL

## 2013-10-10 ENCOUNTER — Other Ambulatory Visit: Payer: Self-pay | Admitting: Neurosurgery

## 2013-10-23 ENCOUNTER — Encounter (HOSPITAL_COMMUNITY): Payer: Self-pay | Admitting: Pharmacy Technician

## 2013-10-24 ENCOUNTER — Encounter (HOSPITAL_COMMUNITY)
Admission: RE | Admit: 2013-10-24 | Discharge: 2013-10-24 | Disposition: A | Discharge status: Home / Self Care | Payer: BC Managed Care – PPO | Source: Ambulatory Visit | Attending: Anesthesiology | Admitting: Anesthesiology

## 2013-10-24 ENCOUNTER — Encounter (HOSPITAL_COMMUNITY): Payer: Self-pay

## 2013-10-24 ENCOUNTER — Encounter (HOSPITAL_COMMUNITY)
Admission: RE | Admit: 2013-10-24 | Discharge: 2013-10-24 | Disposition: A | Discharge status: Home / Self Care | Payer: BC Managed Care – PPO | Source: Ambulatory Visit | Attending: Neurosurgery | Admitting: Neurosurgery

## 2013-10-24 DIAGNOSIS — Z01818 Encounter for other preprocedural examination: Secondary | ICD-10-CM | POA: Insufficient documentation

## 2013-10-24 DIAGNOSIS — Z01812 Encounter for preprocedural laboratory examination: Secondary | ICD-10-CM | POA: Insufficient documentation

## 2013-10-24 HISTORY — DX: Shortness of breath: R06.02

## 2013-10-24 HISTORY — DX: Thrombocytopenia, unspecified: D69.6

## 2013-10-24 HISTORY — DX: Herpesviral infection, unspecified: B00.9

## 2013-10-24 LAB — CBC
HCT: 42.9 % (ref 39.0–52.0)
Hemoglobin: 14.3 g/dL (ref 13.0–17.0)
MCH: 30.2 pg (ref 26.0–34.0)
MCHC: 33.3 g/dL (ref 30.0–36.0)
MCV: 90.5 fL (ref 78.0–100.0)
Platelets: 151 10*3/uL (ref 150–400)
RBC: 4.74 MIL/uL (ref 4.22–5.81)
RDW: 13.7 % (ref 11.5–15.5)
WBC: 5.5 10*3/uL (ref 4.0–10.5)

## 2013-10-24 LAB — BASIC METABOLIC PANEL
Anion gap: 12 (ref 5–15)
BUN: 20 mg/dL (ref 6–23)
CO2: 24 mEq/L (ref 19–32)
Calcium: 9.2 mg/dL (ref 8.4–10.5)
Chloride: 103 mEq/L (ref 96–112)
Creatinine, Ser: 0.89 mg/dL (ref 0.50–1.35)
GFR calc Af Amer: >90 mL/min (ref >90)
GFR calc non Af Amer: >90 mL/min (ref >90)
Glucose, Bld: 99 mg/dL (ref 70–99)
Potassium: 4.6 mEq/L (ref 3.7–5.3)
Sodium: 139 mEq/L (ref 137–147)

## 2013-10-24 LAB — TYPE AND SCREEN
ABO/RH(D): A POS
Antibody Screen: NEGATIVE

## 2013-10-24 LAB — SURGICAL PCR SCREEN
MRSA, PCR: NEGATIVE
Staphylococcus aureus: NEGATIVE

## 2013-10-24 LAB — ABO/RH: ABO/RH(D): A POS

## 2013-10-24 NOTE — Pre-Procedure Instructions (Signed)
Dillon PellegriniJohn D Macdonald  10/24/2013   Your procedure is scheduled on: Monday, July 27.  [facility] at 10:30[facility].  Call this number if you have problems the morning of surgery: [phone]   Remember:   Do not eat food or drink liquids after midnight Sunday, July 26.  Take these medicines the morning of surgery with A SIP OF WATER: Take if needed Valtrex, Oxycodone acetaminophen and Zofran.              STOP taking Aspirin on Monday, July 20.   Do not wear jewelry, make-up or nail polish.  Do not wear lotions, powders, or perfumes.              Men may shave face and neck.  Do not bring valuables to the hospital.              [facility] is not responsible for any belongings or valuables.               Contacts, dentures or bridgework may not be worn into surgery.  Leave suitcase in the car. After surgery it may be brought to your room.  For patients admitted to the hospital, discharge time is determined by yourtreatment team.                Special Instructions: Review  [place] - Preparing For Surgery.   Please read over the following fact sheets that you were given: Pain Booklet, Coughing and Deep Breathing, Blood Transfusion Information and Surgical Site Infection Prevention

## 2013-10-24 NOTE — Pre-Procedure Instructions (Addendum)
Dillon PellegriniJohn D Macdonald  10/24/2013   Your procedure is scheduled on: Monday, July 27.  [facility] at 8:50AM.  Call this number if you have problems the morning of surgery: [phone]   Remember:   Do not eat food or drink liquids after midnight Sunday, July 26.  Take these medicines the morning of surgery with A SIP OF WATER: Take if needed Valtrex, Oxycodone acetaminophen and Zofran.              STOP taking Aspirin on Monday, July 20.   Do not wear jewelry, make-up or nail polish.  Do not wear lotions, powders, or perfumes.              Men may shave face and neck.  Do not bring valuables to the hospital.              [facility] is not responsible for any belongings or valuables.               Contacts, dentures or bridgework may not be worn into surgery.  Leave suitcase in the car. After surgery it may be brought to your room.  For patients admitted to the hospital, discharge time is determined by yourtreatment team.                Special Instructions: Review  [place] - Preparing For Surgery.   Please read over the following fact sheets that you were given: Pain Booklet, Coughing and Deep Breathing, Blood Transfusion Information and Surgical Site Infection Prevention

## 2013-10-24 NOTE — Progress Notes (Addendum)
Mr Dillon Macdonald reports that he does see a cardiologist.  He did have a stress test last year [facility], because he was having upper chest pain- "it was ok", and it was determined pain was coming from cervical disk.  Mr Dillon Macdonald senies further chest pain post cervical surgery.  Mr Dillon Macdonald [facility] year for Thrombocytopenia, found with labs drawn prior to cervical surgery.  "They said it was probably du to pain medication."  Mr. Dillon Macdonald reports feeling short of breath when he takes Oxycodone, "really all the time, I don't know if it is the medication or the pain."   I asked patient if he was currently feeling that was and he said yes,  resp 20 and non labored appearing, oxygen sat was 96% on arrival today..  I recommended that patient to follow up with [facility].

## 2013-11-05 MED ORDER — CEFAZOLIN SODIUM-DEXTROSE 2-3 GM-% IV SOLR
2.0000 g | INTRAVENOUS | Status: AC
  Administered 2013-11-06 (×2): 2 g via INTRAVENOUS
  Filled 2013-11-05: qty 50

## 2013-11-06 ENCOUNTER — Inpatient Hospital Stay (HOSPITAL_COMMUNITY): Payer: BC Managed Care – PPO | Admitting: Anesthesiology

## 2013-11-06 ENCOUNTER — Inpatient Hospital Stay (HOSPITAL_COMMUNITY)
Admission: RE | Admit: 2013-11-06 | Discharge: 2013-11-09 | DRG: 460 | Disposition: A | Discharge status: Home Health | Payer: BC Managed Care – PPO | Source: Ambulatory Visit | Attending: Neurosurgery | Admitting: Neurosurgery

## 2013-11-06 ENCOUNTER — Encounter (HOSPITAL_COMMUNITY): Payer: BC Managed Care – PPO | Admitting: Anesthesiology

## 2013-11-06 ENCOUNTER — Encounter (HOSPITAL_COMMUNITY): Payer: Self-pay | Admitting: Anesthesiology

## 2013-11-06 ENCOUNTER — Encounter (HOSPITAL_COMMUNITY)
Admission: RE | Disposition: A | Discharge status: Home Health | Payer: Self-pay | Source: Ambulatory Visit | Attending: Neurosurgery

## 2013-11-06 ENCOUNTER — Inpatient Hospital Stay (HOSPITAL_COMMUNITY): Payer: BC Managed Care – PPO

## 2013-11-06 DIAGNOSIS — F17200 Nicotine dependence, unspecified, uncomplicated: Secondary | ICD-10-CM | POA: Diagnosis present

## 2013-11-06 DIAGNOSIS — Z981 Arthrodesis status: Secondary | ICD-10-CM

## 2013-11-06 DIAGNOSIS — K560 Paralytic ileus: Secondary | ICD-10-CM | POA: Diagnosis present

## 2013-11-06 DIAGNOSIS — M4000 Postural kyphosis, site unspecified: Secondary | ICD-10-CM | POA: Diagnosis present

## 2013-11-06 DIAGNOSIS — M48062 Spinal stenosis, lumbar region with neurogenic claudication: Principal | ICD-10-CM | POA: Diagnosis present

## 2013-11-06 SURGERY — POSTERIOR LUMBAR FUSION 2 LEVEL
Anesthesia: General | Site: Back

## 2013-11-06 MED ORDER — OXYCODONE-ACETAMINOPHEN 5-325 MG PO TABS
1.0000 | ORAL_TABLET | ORAL | Status: DC | PRN
  Administered 2013-11-06: 1 via ORAL
  Administered 2013-11-07 (×4): 2 via ORAL
  Filled 2013-11-06 (×5): qty 2

## 2013-11-06 MED ORDER — SODIUM CHLORIDE 0.9 % IR SOLN
Status: DC | PRN
Start: 2013-11-06 — End: 2013-11-06
  Administered 2013-11-06: 12:00:00

## 2013-11-06 MED ORDER — HYDROMORPHONE HCL PF 1 MG/ML IJ SOLN
0.2500 mg | INTRAMUSCULAR | Status: DC | PRN
  Administered 2013-11-06 (×2): 0.5 mg via INTRAVENOUS

## 2013-11-06 MED ORDER — VANCOMYCIN HCL 1000 MG IV SOLR
INTRAVENOUS | Status: DC | PRN
  Administered 2013-11-06: 1000 mg

## 2013-11-06 MED ORDER — VECURONIUM BROMIDE 10 MG IV SOLR
INTRAVENOUS | Status: AC
  Filled 2013-11-06: qty 10

## 2013-11-06 MED ORDER — 0.9 % SODIUM CHLORIDE (POUR BTL) OPTIME
TOPICAL | Status: DC | PRN
  Administered 2013-11-06: 1000 mL

## 2013-11-06 MED ORDER — ONDANSETRON HCL 4 MG/2ML IJ SOLN: 4.0000 mg | Freq: Four times a day (QID) | INTRAMUSCULAR | Status: DC | PRN

## 2013-11-06 MED ORDER — GLYCOPYRROLATE 0.2 MG/ML IJ SOLN
INTRAMUSCULAR | Status: AC
  Filled 2013-11-06: qty 3

## 2013-11-06 MED ORDER — HYDROMORPHONE HCL PF 1 MG/ML IJ SOLN
INTRAMUSCULAR | Status: AC
  Filled 2013-11-06: qty 1

## 2013-11-06 MED ORDER — ACETAMINOPHEN 650 MG RE SUPP
650.0000 mg | RECTAL | Status: DC | PRN
Start: 2013-11-06 — End: 2013-11-09

## 2013-11-06 MED ORDER — DEXAMETHASONE SODIUM PHOSPHATE 4 MG/ML IJ SOLN
INTRAMUSCULAR | Status: AC
  Filled 2013-11-06: qty 3

## 2013-11-06 MED ORDER — STERILE WATER FOR INJECTION IJ SOLN
INTRAMUSCULAR | Status: AC
  Filled 2013-11-06: qty 10

## 2013-11-06 MED ORDER — BUPIVACAINE HCL (PF) 0.25 % IJ SOLN
INTRAMUSCULAR | Status: DC | PRN
Start: 2013-11-06 — End: 2013-11-06
  Administered 2013-11-06: 20 mL

## 2013-11-06 MED ORDER — POLYETHYLENE GLYCOL 3350 17 G PO PACK
17.0000 g | PACK | Freq: Every day | ORAL | Status: DC | PRN
  Administered 2013-11-07 – 2013-11-09 (×3): 17 g via ORAL
  Filled 2013-11-06 (×3): qty 1

## 2013-11-06 MED ORDER — LACTATED RINGERS IV SOLN
INTRAVENOUS | Status: DC | PRN
  Administered 2013-11-06 (×2): via INTRAVENOUS

## 2013-11-06 MED ORDER — LACTATED RINGERS IV SOLN
Freq: Once | INTRAVENOUS | Status: AC
  Administered 2013-11-06: 09:00:00 via INTRAVENOUS

## 2013-11-06 MED ORDER — ALUM & MAG HYDROXIDE-SIMETH 200-200-20 MG/5ML PO SUSP
30.0000 mL | Freq: Four times a day (QID) | ORAL | Status: DC | PRN
  Administered 2013-11-06 – 2013-11-07 (×2): 30 mL via ORAL
  Filled 2013-11-06 (×2): qty 30

## 2013-11-06 MED ORDER — LIDOCAINE HCL (CARDIAC) 20 MG/ML IV SOLN
INTRAVENOUS | Status: DC | PRN
  Administered 2013-11-06: 80 mg via INTRAVENOUS

## 2013-11-06 MED ORDER — LIDOCAINE HCL (CARDIAC) 20 MG/ML IV SOLN
INTRAVENOUS | Status: AC
  Filled 2013-11-06: qty 5

## 2013-11-06 MED ORDER — DIPHENHYDRAMINE HCL 50 MG/ML IJ SOLN: 12.5000 mg | Freq: Four times a day (QID) | INTRAMUSCULAR | Status: DC | PRN

## 2013-11-06 MED ORDER — SENNA 8.6 MG PO TABS
1.0000 | ORAL_TABLET | Freq: Two times a day (BID) | ORAL | Status: DC
  Administered 2013-11-07 – 2013-11-09 (×5): 8.6 mg via ORAL
  Filled 2013-11-06 (×5): qty 1

## 2013-11-06 MED ORDER — SODIUM CHLORIDE 0.9 % IJ SOLN
3.0000 mL | Freq: Two times a day (BID) | INTRAMUSCULAR | Status: DC
  Administered 2013-11-06 – 2013-11-08 (×3): 3 mL via INTRAVENOUS

## 2013-11-06 MED ORDER — MENTHOL 3 MG MT LOZG: 1.0000 | LOZENGE | OROMUCOSAL | Status: DC | PRN

## 2013-11-06 MED ORDER — ARTIFICIAL TEARS OP OINT
TOPICAL_OINTMENT | OPHTHALMIC | Status: DC | PRN
  Administered 2013-11-06: 1 via OPHTHALMIC

## 2013-11-06 MED ORDER — VALACYCLOVIR HCL 500 MG PO TABS
1000.0000 mg | ORAL_TABLET | Freq: Every day | ORAL | Status: DC
  Administered 2013-11-07 – 2013-11-09 (×3): 1000 mg via ORAL
  Filled 2013-11-06 (×3): qty 2

## 2013-11-06 MED ORDER — NEOSTIGMINE METHYLSULFATE 10 MG/10ML IV SOLN
INTRAVENOUS | Status: AC
  Filled 2013-11-06: qty 1

## 2013-11-06 MED ORDER — ONDANSETRON HCL 4 MG PO TABS
4.0000 mg | ORAL_TABLET | Freq: Four times a day (QID) | ORAL | Status: DC | PRN
  Administered 2013-11-07: 4 mg via ORAL
  Filled 2013-11-06: qty 1

## 2013-11-06 MED ORDER — PROPOFOL 10 MG/ML IV BOLUS
INTRAVENOUS | Status: AC
  Filled 2013-11-06: qty 20

## 2013-11-06 MED ORDER — GLYCOPYRROLATE 0.2 MG/ML IJ SOLN
INTRAMUSCULAR | Status: DC | PRN
  Administered 2013-11-06: 0.6 mg via INTRAVENOUS

## 2013-11-06 MED ORDER — ACETAMINOPHEN 325 MG PO TABS: 325.0000 mg | ORAL_TABLET | ORAL | Status: DC | PRN

## 2013-11-06 MED ORDER — VECURONIUM BROMIDE 10 MG IV SOLR
INTRAVENOUS | Status: DC | PRN
  Administered 2013-11-06 (×3): 3 mg via INTRAVENOUS
  Administered 2013-11-06: 4 mg via INTRAVENOUS
  Administered 2013-11-06: 10 mg via INTRAVENOUS

## 2013-11-06 MED ORDER — CEFAZOLIN SODIUM 1-5 GM-% IV SOLN
1.0000 g | Freq: Three times a day (TID) | INTRAVENOUS | Status: AC
  Administered 2013-11-06 – 2013-11-07 (×2): 1 g via INTRAVENOUS
  Filled 2013-11-06 (×2): qty 50

## 2013-11-06 MED ORDER — SUFENTANIL CITRATE 50 MCG/ML IV SOLN
INTRAVENOUS | Status: DC | PRN
  Administered 2013-11-06 (×3): 10 ug via INTRAVENOUS
  Administered 2013-11-06: 20 ug via INTRAVENOUS
  Administered 2013-11-06: 10 ug via INTRAVENOUS

## 2013-11-06 MED ORDER — FLEET ENEMA 7-19 GM/118ML RE ENEM: 1.0000 | ENEMA | Freq: Once | RECTAL | Status: AC | PRN

## 2013-11-06 MED ORDER — DEXAMETHASONE SODIUM PHOSPHATE 10 MG/ML IJ SOLN
10.0000 mg | INTRAMUSCULAR | Status: AC
  Administered 2013-11-06: 10 mg via INTRAVENOUS

## 2013-11-06 MED ORDER — THROMBIN 20000 UNITS EX SOLR
CUTANEOUS | Status: DC | PRN
  Administered 2013-11-06 (×2): via TOPICAL

## 2013-11-06 MED ORDER — HYDROMORPHONE HCL PF 1 MG/ML IJ SOLN: 0.5000 mg | INTRAMUSCULAR | Status: DC | PRN

## 2013-11-06 MED ORDER — PHENYLEPHRINE HCL 10 MG/ML IJ SOLN
10.0000 mg | INTRAVENOUS | Status: DC | PRN
  Administered 2013-11-06: 20 ug/min via INTRAVENOUS

## 2013-11-06 MED ORDER — ACETAMINOPHEN 160 MG/5ML PO SOLN
325.0000 mg | ORAL | Status: DC | PRN
  Filled 2013-11-06: qty 20.3

## 2013-11-06 MED ORDER — SODIUM CHLORIDE 0.9 % IJ SOLN: 9.0000 mL | INTRAMUSCULAR | Status: DC | PRN

## 2013-11-06 MED ORDER — NALOXONE HCL 0.4 MG/ML IJ SOLN: 0.4000 mg | INTRAMUSCULAR | Status: DC | PRN

## 2013-11-06 MED ORDER — NEOSTIGMINE METHYLSULFATE 10 MG/10ML IV SOLN
INTRAVENOUS | Status: DC | PRN
  Administered 2013-11-06: 5 mg via INTRAVENOUS

## 2013-11-06 MED ORDER — SODIUM CHLORIDE 0.9 % IV SOLN
250.0000 mg | INTRAVENOUS | Status: DC | PRN
  Administered 2013-11-06: 10 ug/kg/min via INTRAVENOUS

## 2013-11-06 MED ORDER — ONDANSETRON HCL 4 MG/2ML IJ SOLN
4.0000 mg | Freq: Once | INTRAMUSCULAR | Status: AC | PRN
  Administered 2013-11-06: 4 mg via INTRAVENOUS

## 2013-11-06 MED ORDER — PHENOL 1.4 % MT LIQD
1.0000 | OROMUCOSAL | Status: DC | PRN
  Administered 2013-11-06: 1 via OROMUCOSAL
  Filled 2013-11-06: qty 177

## 2013-11-06 MED ORDER — PHENYLEPHRINE HCL 10 MG/ML IJ SOLN
INTRAMUSCULAR | Status: DC | PRN
  Administered 2013-11-06 (×6): 40 ug via INTRAVENOUS

## 2013-11-06 MED ORDER — PROPOFOL 10 MG/ML IV BOLUS
INTRAVENOUS | Status: DC | PRN
  Administered 2013-11-06: 120 mg via INTRAVENOUS

## 2013-11-06 MED ORDER — LACTATED RINGERS IV SOLN
INTRAVENOUS | Status: DC | PRN
Start: 2013-11-06 — End: 2013-11-06
  Administered 2013-11-06 (×3): via INTRAVENOUS

## 2013-11-06 MED ORDER — HYDROMORPHONE 0.3 MG/ML IV SOLN
INTRAVENOUS | Status: DC
  Administered 2013-11-06: 17:00:00 via INTRAVENOUS
  Administered 2013-11-07: 1.4 mg via INTRAVENOUS
  Administered 2013-11-07: 0.6 mg via INTRAVENOUS

## 2013-11-06 MED ORDER — SUFENTANIL CITRATE 50 MCG/ML IV SOLN
INTRAVENOUS | Status: AC
  Filled 2013-11-06: qty 1

## 2013-11-06 MED ORDER — EPHEDRINE SULFATE 50 MG/ML IJ SOLN
INTRAMUSCULAR | Status: DC | PRN
  Administered 2013-11-06: 5 mg via INTRAVENOUS

## 2013-11-06 MED ORDER — HYDROMORPHONE 0.3 MG/ML IV SOLN
INTRAVENOUS | Status: AC
  Filled 2013-11-06: qty 25

## 2013-11-06 MED ORDER — OXYCODONE HCL 5 MG PO TABS: 5.0000 mg | ORAL_TABLET | Freq: Once | ORAL | Status: DC | PRN

## 2013-11-06 MED ORDER — CYCLOBENZAPRINE HCL 10 MG PO TABS
10.0000 mg | ORAL_TABLET | Freq: Three times a day (TID) | ORAL | Status: DC | PRN
  Administered 2013-11-06 – 2013-11-07 (×3): 10 mg via ORAL
  Filled 2013-11-06 (×3): qty 1

## 2013-11-06 MED ORDER — KETAMINE HCL 100 MG/ML IJ SOLN
INTRAMUSCULAR | Status: AC
  Administered 2013-11-06: 50 mg via INTRAVENOUS
  Filled 2013-11-06: qty 1

## 2013-11-06 MED ORDER — DIPHENHYDRAMINE HCL 12.5 MG/5ML PO ELIX: 12.5000 mg | ORAL_SOLUTION | Freq: Four times a day (QID) | ORAL | Status: DC | PRN

## 2013-11-06 MED ORDER — ONDANSETRON HCL 4 MG/2ML IJ SOLN
INTRAMUSCULAR | Status: DC | PRN
  Administered 2013-11-06: 4 mg via INTRAVENOUS

## 2013-11-06 MED ORDER — OXYCODONE HCL 5 MG/5ML PO SOLN: 5.0000 mg | Freq: Once | ORAL | Status: DC | PRN

## 2013-11-06 MED ORDER — SODIUM CHLORIDE 0.9 % IV SOLN
250.0000 mL | INTRAVENOUS | Status: DC
  Administered 2013-11-06: 14:00:00 via INTRAVENOUS

## 2013-11-06 MED ORDER — VANCOMYCIN HCL 1000 MG IV SOLR
INTRAVENOUS | Status: AC
  Filled 2013-11-06: qty 1000

## 2013-11-06 MED ORDER — MIDAZOLAM HCL 2 MG/2ML IJ SOLN
INTRAMUSCULAR | Status: AC
  Filled 2013-11-06: qty 2

## 2013-11-06 MED ORDER — ONDANSETRON HCL 4 MG/2ML IJ SOLN
4.0000 mg | INTRAMUSCULAR | Status: DC | PRN
  Filled 2013-11-06: qty 2

## 2013-11-06 MED ORDER — CEFAZOLIN SODIUM-DEXTROSE 2-3 GM-% IV SOLR
INTRAVENOUS | Status: AC
Start: 2013-11-06 — End: 2013-11-06
  Filled 2013-11-06: qty 50

## 2013-11-06 MED ORDER — ALBUMIN HUMAN 5 % IV SOLN
INTRAVENOUS | Status: DC | PRN
  Administered 2013-11-06 (×2): via INTRAVENOUS

## 2013-11-06 MED ORDER — SODIUM CHLORIDE 0.9 % IJ SOLN: 3.0000 mL | INTRAMUSCULAR | Status: DC | PRN

## 2013-11-06 MED ORDER — ONDANSETRON HCL 4 MG/2ML IJ SOLN
INTRAMUSCULAR | Status: AC
  Filled 2013-11-06: qty 2

## 2013-11-06 MED ORDER — BISACODYL 10 MG RE SUPP
10.0000 mg | Freq: Every day | RECTAL | Status: DC | PRN
  Administered 2013-11-08 – 2013-11-09 (×2): 10 mg via RECTAL
  Filled 2013-11-06 (×2): qty 1

## 2013-11-06 MED ORDER — HYDROCODONE-ACETAMINOPHEN 5-325 MG PO TABS
1.0000 | ORAL_TABLET | ORAL | Status: DC | PRN
  Administered 2013-11-08 (×2): 2 via ORAL
  Administered 2013-11-08: 1 via ORAL
  Administered 2013-11-09: 2 via ORAL
  Filled 2013-11-06: qty 1
  Filled 2013-11-06 (×3): qty 2

## 2013-11-06 MED ORDER — SODIUM CHLORIDE 0.9 % IJ SOLN
INTRAMUSCULAR | Status: AC
  Filled 2013-11-06: qty 10

## 2013-11-06 MED ORDER — ACETAMINOPHEN 325 MG PO TABS
650.0000 mg | ORAL_TABLET | ORAL | Status: DC | PRN
  Administered 2013-11-06: 650 mg via ORAL
  Filled 2013-11-06: qty 2

## 2013-11-06 MED ORDER — MIDAZOLAM HCL 5 MG/5ML IJ SOLN
INTRAMUSCULAR | Status: DC | PRN
  Administered 2013-11-06: 2 mg via INTRAVENOUS

## 2013-11-06 MED ORDER — ROCURONIUM BROMIDE 50 MG/5ML IV SOLN
INTRAVENOUS | Status: AC
  Filled 2013-11-06: qty 1

## 2013-11-06 SURGICAL SUPPLY — 76 items
BAG DECANTER FOR FLEXI CONT (MISCELLANEOUS) ×2 IMPLANT
BENZOIN TINCTURE PRP APPL 2/3 (GAUZE/BANDAGES/DRESSINGS) ×2 IMPLANT
BLADE 10 SAFETY STRL DISP (BLADE) ×2 IMPLANT
BLADE SURG ROTATE 9660 (MISCELLANEOUS) IMPLANT
BRUSH SCRUB EZ PLAIN DRY (MISCELLANEOUS) ×2 IMPLANT
BUR MATCHSTICK NEURO 3.0 LAGG (BURR) ×2 IMPLANT
CAGE CAPSTONE BULLET 12X22 (Cage) ×2 IMPLANT
CAGE CAPSTONE BULLET 8X22 (Cage) ×2 IMPLANT
CANISTER SUCT 3000ML (MISCELLANEOUS) ×2 IMPLANT
CAP INCHANGES (Cap) ×8 IMPLANT
CAP LCK SPNE (Orthopedic Implant) ×10 IMPLANT
CAP LOCK SPINE RADIUS (Orthopedic Implant) ×10 IMPLANT
CAP LOCKING (Orthopedic Implant) ×10 IMPLANT
CONT SPEC 4OZ CLIKSEAL STRL BL (MISCELLANEOUS) ×6 IMPLANT
COVER BACK TABLE 24X17X13 BIG (DRAPES) IMPLANT
COVER TABLE BACK 60X90 (DRAPES) ×2 IMPLANT
CROSSLINK MEDIUM (Orthopedic Implant) ×2 IMPLANT
DECANTER SPIKE VIAL GLASS SM (MISCELLANEOUS) ×2 IMPLANT
DERMABOND ADHESIVE PROPEN (GAUZE/BANDAGES/DRESSINGS) ×1
DERMABOND ADVANCED (GAUZE/BANDAGES/DRESSINGS) ×1
DERMABOND ADVANCED .7 DNX12 (GAUZE/BANDAGES/DRESSINGS) ×1 IMPLANT
DERMABOND ADVANCED .7 DNX6 (GAUZE/BANDAGES/DRESSINGS) ×1 IMPLANT
DRAPE C-ARM 42X72 X-RAY (DRAPES) ×6 IMPLANT
DRAPE LAPAROTOMY 100X72X124 (DRAPES) ×2 IMPLANT
DRAPE POUCH INSTRU U-SHP 10X18 (DRAPES) ×2 IMPLANT
DRAPE PROXIMA HALF (DRAPES) IMPLANT
DRAPE SURG 17X23 STRL (DRAPES) ×8 IMPLANT
DRSG OPSITE POSTOP 4X10 (GAUZE/BANDAGES/DRESSINGS) ×2 IMPLANT
DURAPREP 26ML APPLICATOR (WOUND CARE) ×2 IMPLANT
ELECT REM PT RETURN 9FT ADLT (ELECTROSURGICAL) ×2
ELECTRODE REM PT RTRN 9FT ADLT (ELECTROSURGICAL) ×1 IMPLANT
EVACUATOR 1/8 PVC DRAIN (DRAIN) ×2 IMPLANT
GAUZE SPONGE 4X4 16PLY XRAY LF (GAUZE/BANDAGES/DRESSINGS) ×2 IMPLANT
GLOVE BIO SURGEON STRL SZ8 (GLOVE) ×2 IMPLANT
GLOVE BIOGEL PI IND STRL 7.0 (GLOVE) ×3 IMPLANT
GLOVE BIOGEL PI IND STRL 7.5 (GLOVE) ×3 IMPLANT
GLOVE BIOGEL PI INDICATOR 7.0 (GLOVE) ×3
GLOVE BIOGEL PI INDICATOR 7.5 (GLOVE) ×3
GLOVE ECLIPSE 9.0 STRL (GLOVE) ×4 IMPLANT
GLOVE EXAM NITRILE LRG STRL (GLOVE) IMPLANT
GLOVE EXAM NITRILE MD LF STRL (GLOVE) IMPLANT
GLOVE EXAM NITRILE XL STR (GLOVE) IMPLANT
GLOVE EXAM NITRILE XS STR PU (GLOVE) IMPLANT
GLOVE INDICATOR 8.5 STRL (GLOVE) ×2 IMPLANT
GLOVE SS BIOGEL STRL SZ 6.5 (GLOVE) ×3 IMPLANT
GLOVE SUPERSENSE BIOGEL SZ 6.5 (GLOVE) ×3
GOWN STRL REUS W/ TWL LRG LVL3 (GOWN DISPOSABLE) ×2 IMPLANT
GOWN STRL REUS W/ TWL XL LVL3 (GOWN DISPOSABLE) ×4 IMPLANT
GOWN STRL REUS W/TWL 2XL LVL3 (GOWN DISPOSABLE) IMPLANT
GOWN STRL REUS W/TWL LRG LVL3 (GOWN DISPOSABLE) ×2
GOWN STRL REUS W/TWL XL LVL3 (GOWN DISPOSABLE) ×4
KIT BASIN OR (CUSTOM PROCEDURE TRAY) ×2 IMPLANT
KIT ROOM TURNOVER OR (KITS) ×2 IMPLANT
MILL MEDIUM DISP (BLADE) ×2 IMPLANT
NEEDLE HYPO 22GX1.5 SAFETY (NEEDLE) ×2 IMPLANT
NS IRRIG 1000ML POUR BTL (IV SOLUTION) ×2 IMPLANT
PACK LAMINECTOMY NEURO (CUSTOM PROCEDURE TRAY) ×2 IMPLANT
PATTIES SURGICAL 1X1 (DISPOSABLE) ×2 IMPLANT
ROD 220MM (Rod) ×4 IMPLANT
SCREW 4.75X40MM (Screw) ×4 IMPLANT
SCREW 5.75X40M (Screw) ×8 IMPLANT
SCREW 5.75X45MM (Screw) ×4 IMPLANT
SCREW 6.75X45MM (Screw) ×4 IMPLANT
SPONGE GAUZE 4X4 12PLY (GAUZE/BANDAGES/DRESSINGS) ×2 IMPLANT
SPONGE SURGIFOAM ABS GEL 100 (HEMOSTASIS) ×2 IMPLANT
STRIP BIOACTIVE VITOSS 25X100X (Neuro Prosthesis/Implant) ×4 IMPLANT
STRIP CLOSURE SKIN 1/2X4 (GAUZE/BANDAGES/DRESSINGS) ×2 IMPLANT
SUT VIC AB 0 CT1 18XCR BRD8 (SUTURE) ×2 IMPLANT
SUT VIC AB 0 CT1 8-18 (SUTURE) ×2
SUT VIC AB 2-0 CT1 18 (SUTURE) ×2 IMPLANT
SUT VIC AB 3-0 SH 8-18 (SUTURE) ×6 IMPLANT
SYR 20ML ECCENTRIC (SYRINGE) ×2 IMPLANT
TOWEL OR 17X24 6PK STRL BLUE (TOWEL DISPOSABLE) ×2 IMPLANT
TOWEL OR 17X26 10 PK STRL BLUE (TOWEL DISPOSABLE) ×2 IMPLANT
TRAY FOLEY CATH 14FRSI W/METER (CATHETERS) ×2 IMPLANT
WATER STERILE IRR 1000ML POUR (IV SOLUTION) ×2 IMPLANT

## 2013-11-06 NOTE — Progress Notes (Signed)
Pt moving spont, FC's, will not answer questions

## 2013-11-06 NOTE — Anesthesia Procedure Notes (Signed)
Procedure Name: Intubation Date/Time: 11/06/2013 10:51 AM Performed by: Lovie CholOCK, Jaskaran Dauzat K Pre-anesthesia Checklist: Patient identified, Emergency Drugs available, Suction available, Patient being monitored and Timeout performed Patient Re-evaluated:Patient Re-evaluated prior to inductionOxygen Delivery Method: Circle system utilized Preoxygenation: Pre-oxygenation with 100% oxygen Intubation Type: IV induction Ventilation: Mask ventilation without difficulty and Oral airway inserted - appropriate to patient size Laryngoscope Size: Miller and 2 Grade View: Grade I Tube type: Oral Tube size: 7.5 mm Number of attempts: 1 Airway Equipment and Method: Stylet Placement Confirmation: positive ETCO2,  CO2 detector,  breath sounds checked- equal and bilateral and ETT inserted through vocal cords under direct vision Secured at: 22 cm Tube secured with: Tape Dental Injury: Teeth and Oropharynx as per pre-operative assessment

## 2013-11-06 NOTE — Transfer of Care (Signed)
Immediate Anesthesia Transfer of Care Note  Patient: Dillon PellegriniJohn D Macdonald  Procedure(s) Performed: Procedure(s): Lumbar One to Two, Lumbar Two to Three Posterior Lumbar Interbody Fusion, Thoracic Ten to Lumbar Four Posterior Lateral Arthrodesis with Pedicle Screws (N/A)  Patient Location: PACU  Anesthesia Type:General  Level of Consciousness: sedated and patient cooperative  Airway & Oxygen Therapy: Patient Spontanous Breathing and Patient connected to face mask oxygen  Post-op Assessment: Report given to PACU RN and Post -op Vital signs reviewed and stable  Post vital signs: Reviewed  Complications: No apparent anesthesia complications

## 2013-11-06 NOTE — H&P (Signed)
Dillon Macdonald is an 43 y.o. male.   Chief Complaint: Back pain   HPI: 43 year old male status post previous L3-L5 decompression and fusion with good overall results more than 10 years ago. Patient presents with worsening back pain with radiation into the anterior aspects of both thighs. Symptoms are aggravated by prolonged standing or walking. Pain is become progressively debilitating. Patient without evidence of significant motor loss or sensory loss. Having no bowel or bladder dysfunction. Workup demonstrates evidence of significant spondylosis stenosis and marked retrolisthesis of L2 on L3. At L1 to the patient has broad-based disc bulging and kyphotic angulation worsening her stenosis. Patient has some disc degeneration and early stenosis at T12-L1. He has some kyphotic sagittal imbalance extending up to T10. Patient presents now for interbody decompression and fusion at L1-2 and L2-3 with posterior lateral arthrodesis from T10-L4.    Past Medical History  Diagnosis Date  . Back pain   . Shortness of breath     feels that way all the time, ? pain or Percocet  . Thrombocytopenia 2014  . Herpes simplex     Past Surgical History  Procedure Laterality Date  . Back surgery      lumbar fusion L3- 4, S 1  . Cervical fusion  2014    History reviewed. No pertinent family history. Social History:  reports that he has been smoking.  He does not have any smokeless tobacco history on file. He reports that he does not drink alcohol or use illicit drugs.  Allergies: No Known Allergies  Medications Prior to Admission  Medication Sig Dispense Refill  . oxyCODONE-acetaminophen (PERCOCET/ROXICET) 5-325 MG per tablet Take 1-2 tablets by mouth every 4 (four) hours as needed for moderate pain or severe pain.      . valACYclovir (VALTREX) 1000 MG tablet Take 1,000 mg by mouth every morning.      Marland Kitchen. aspirin EC 325 MG tablet Take 325 mg by mouth daily.      . ondansetron (ZOFRAN) 4 MG tablet Take 4 mg by  mouth every 6 (six) hours as needed (for nausea caused by Percocet).         No results found for this or any previous visit (from the past 48 hour(s)). No results found.  Review of Systems  Constitutional: Negative.   HENT: Negative.   Eyes: Negative.   Respiratory: Negative.   Cardiovascular: Negative.   Gastrointestinal: Negative.   Genitourinary: Negative.   Musculoskeletal: Negative.   Skin: Negative.   Neurological: Negative.   Endo/Heme/Allergies: Negative.   Psychiatric/Behavioral: Negative.     Blood pressure 146/89, pulse 83, temperature 98.2 F (36.8 C), temperature source Oral, resp. rate 20, height 5\' 8"  (1.727 m), weight 86.183 kg (190 lb), SpO2 100.00%. Physical Exam  Constitutional: He is oriented to person, place, and time. He appears well-developed and well-nourished. No distress.  HENT:  Head: Normocephalic and atraumatic.  Right Ear: External ear normal.  Left Ear: External ear normal.  Nose: Nose normal.  Mouth/Throat: Oropharynx is clear and moist. No oropharyngeal exudate.  Eyes: Conjunctivae and EOM are normal. Pupils are equal, round, and reactive to light. Right eye exhibits no discharge. Left eye exhibits no discharge.  Neck: Normal range of motion. Neck supple. No tracheal deviation present. No thyromegaly present.  Cardiovascular: Normal rate, regular rhythm and intact distal pulses.  Exam reveals no friction rub.   No murmur heard. Respiratory: Effort normal and breath sounds normal. No respiratory distress. He has no wheezes.  GI:  Soft. Bowel sounds are normal. He exhibits no distension. There is no tenderness.  Neurological: He is alert and oriented to person, place, and time. He has normal reflexes. He displays normal reflexes. No cranial nerve deficit. He exhibits normal muscle tone. Coordination normal.  Skin: Skin is warm and dry. No rash noted. He is not diaphoretic. No erythema. No pallor.  Psychiatric: He has a normal mood and affect. His  behavior is normal. Judgment and thought content normal.     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 Plan L1-2, L2-3 posterior lumbar interbody fusion utilizing tangent interbody allograft wedge Telamon her by peek cage and local autograft. This will be coupled with posterior lateral arthrodesis from T10-L4. This will require reexploring his previous fusion. I've discussed the risks and benefits involved with surgery. The patient is aware. He wishes to proceed.  Dillon Macdonald A 11/06/2013, 10:09 AM

## 2013-11-06 NOTE — Brief Op Note (Signed)
11/06/2013  3:24 PM  PATIENT:  Dillon Macdonald  43 y.o. male  PRE-OPERATIVE DIAGNOSIS:  stenosis  POST-OPERATIVE DIAGNOSIS:  stenosis  PROCEDURE:  Procedure(s): Lumbar One to Two, Lumbar Two to Three Posterior Lumbar Interbody Fusion, Thoracic Ten to Lumbar Four Posterior Lateral Arthrodesis with Pedicle Screws (N/A)  SURGEON:  Surgeon(s) and Role:    * Temple PaciniHenry A Huckleberry Martinson, MD - Primary    * Mariam DollarGary P Cram, MD - Assisting  PHYSICIAN ASSISTANT:   ASSISTANTS:    ANESTHESIA:   general  EBL:  Total I/O In: 4060 [I.V.:3200; Blood:360; IV Piggyback:500] Out: 1275 [Urine:325; Blood:950]  BLOOD ADMINISTERED:none  DRAINS: (Med) Hemovact drain(s) in the epidural space with  Suction Open   LOCAL MEDICATIONS USED:  MARCAINE     SPECIMEN:  No Specimen  DISPOSITION OF SPECIMEN:  N/A  COUNTS:  YES  TOURNIQUET:  * No tourniquets in log *  DICTATION: .Dragon Dictation  PLAN OF CARE: Admit to inpatient   PATIENT DISPOSITION:  PACU - hemodynamically stable.   Delay start of Pharmacological VTE agent (>24hrs) due to surgical blood loss or risk of bleeding: yes

## 2013-11-06 NOTE — Anesthesia Preprocedure Evaluation (Addendum)
Anesthesia Evaluation  Patient identified by MRN, date of birth, ID band Patient awake    Reviewed: Allergy & Precautions, H&P , NPO status , Patient's Chart, lab work & pertinent test results  History of Anesthesia Complications Negative for: history of anesthetic complications  Airway Mallampati: II TM Distance: >3 FB Neck ROM: Full    Dental  (+) Teeth Intact, Missing, Dental Advisory Given,    Pulmonary shortness of breath, neg sleep apnea, neg COPDneg recent URI, Current Smoker,    Pulmonary exam normal       Cardiovascular negative cardio ROS  Rhythm:Regular Rate:Normal     Neuro/Psych Chronic back pain with right symptoms negative psych ROS   GI/Hepatic negative GI ROS, Neg liver ROS,   Endo/Other  negative endocrine ROS  Renal/GU negative Renal ROS     Musculoskeletal negative musculoskeletal ROS (+)   Abdominal   Peds  Hematology thrombocytopenia   Anesthesia Other Findings Braces upper and lower  Reproductive/Obstetrics                          Anesthesia Physical Anesthesia Plan  ASA: II  Anesthesia Plan: General   Post-op Pain Management:    Induction: Intravenous  Airway Management Planned: Oral ETT  Additional Equipment: Arterial line  Intra-op Plan:   Post-operative Plan: Extubation in OR  Informed Consent: I have reviewed the patients History and Physical, chart, labs and discussed the procedure including the risks, benefits and alternatives for the proposed anesthesia with the patient or authorized representative who has indicated his/her understanding and acceptance.   Dental advisory given  Plan Discussed with: CRNA and Surgeon  Anesthesia Plan Comments:         Anesthesia Quick Evaluation

## 2013-11-06 NOTE — Progress Notes (Signed)
Pt sedated unable to assess

## 2013-11-06 NOTE — Anesthesia Postprocedure Evaluation (Signed)
  Anesthesia Post-op Note  Patient: Dillon PellegriniJohn D Macdonald  Procedure(s) Performed: Procedure(s): Lumbar One to Two, Lumbar Two to Three Posterior Lumbar Interbody Fusion, Thoracic Ten to Lumbar Four Posterior Lateral Arthrodesis with Pedicle Screws (N/A)  Patient Location: PACU  Anesthesia Type:General  Level of Consciousness: awake  Airway and Oxygen Therapy: Patient Spontanous Breathing  Post-op Pain: moderate  Post-op Assessment: Post-op Vital signs reviewed, Patient's Cardiovascular Status Stable, Respiratory Function Stable, Patent Airway and No signs of Nausea or vomiting  Post-op Vital Signs: Reviewed and stable  Last Vitals:  Filed Vitals:   11/06/13 1730  BP:   Pulse: 89  Temp:   Resp: 20    Complications: No apparent anesthesia complications

## 2013-11-06 NOTE — Op Note (Signed)
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  L1-2, L2-3 redo decompressive laminectomy with bilateral L1, L2, L3 decompressive foraminotomies. More than would be required for simple interbody fusion alone.  L1-2, L2-3 posterior lumbar interbody fusion utilizing peak interbody cage, locally harvested autograft.  T10-L4 posterior lateral arthrodesis utilizing segmental pedicle screw instrumentation and local autograft.  Surgeon:Miguelina Fore A.Delmy Holdren, M.D.  Asst. Surgeon: Wynetta Emeryram  Anesthesia: General  Indication: 43 year old male with a remote history of L3-L5 decompression infusion presents with intractable back pain and bilateral lower extremity symptoms. Workup demonstrates evidence of solid fusion from L3-L5. Patient has evidence of retrolisthesis with some instability at L2-3 and moderately severe stenosis and severe facet joint diastases. At L1-2 the patient has disc space collapse and retrolisthesis with some moderate stenosis. Patient has disc degeneration at T12-L1 and he has compensatory kyphotic angulation at his thoracic/lumbar junction causing sagittal plane imbalance. Patient has failed conservative management and presents now for decompression and fusion surgery.  Operative note: After induction of anesthesia, patient positioned prone onto the Wilson frame and appropriately padded. Lumbar region prepped and draped as well as thoracic region. Incision made from T10 down to L5. Supper off dissection performed bilaterally. Retractor placed. X-ray taken. Level confirmed. Previously placed pedicle screw sedation at L3-L5 was disassembled. Screws were  removed at L5. Screws at L3 and L4 were solid and left in place. Decompressive laminectomies then performed at L1-L2 and L3 by removing the entire lamina of L1 the entire lamina of L2 entire inferior facets of L1 and L2 bilaterally superior facets of L2 and L3 bilaterally. All bone is cleaned in use and later autografting. Ligament flavum and epidural scar were elevated and resected. Wide decompressive foraminotomies were performed on of course exiting L1, L2, L3 nerve root. Bilateral discectomies were then performed at L1-2 and L2-3. At L2-3 the patient had evidence of retrolisthesis and fishmouthing of his anterior interspace. This disc space was entered and distraction up to 12 mm which rebound once this level. With a 12 mm distractor less than patient's left side thecal sac and nerve roots protected on the right side. The spaces and reamed and then cut with 12 mm tangent instruments. Soft tissue was removed and interspace. 12 x 22 mm Telamon cage packed with morselized autograft was then impacted in place recessed slightly from the posterior cortical margin. Distractor was removed and patient's left side. Thecal sac or respect on the left side. The space was again reamed and then cut. Soft tissues removed and interspace. The space for the curettage. Morselize autograft was packed into the interspace. A second 10 x 22 mm Telamon cage packed with morselized autograft was impacted in place. Procedure then repeated at L1 to using 8 mm implants and local autograft. Pedicles of T10, T. 11, T12, L1 and, L2 were identified using surface landmarks and intraoperative fluoroscopy. Pilot holes were drilled overlying the pedicles. Each pedicle was then probed using a pedicle awl each pedicle awl track was then probed and found to be solidly within the bone. Screw tap sore used at all levels. At T10-4 0.75 mm screws were placed at T11-L1 5.75 mm screws were placed and at L2 6.75 mm screws were placed. All screws were radius  brand from [employer] remember  local. Section of titanium rod was then contoured and placed over the screw heads. This is an secured in place with locking caps. Locking catching and engaged the construct under slight compression. Transverse connector was placed. Final images revealed good position the bone graft and hardware at proper upper level with much improved alignment of the spine. Wounds were irrigated one final time. I transverse processes and residual lamina were decorticated using high-speed drill. Morselized autograft and the cost bone graft extender was placed for later fusion. A medium Hemovac drain was left at per space. Wounds and close in layers with Vicryl sutures. Steri-Strips and sterile dressing were applied. There were no apparent complications. Patient tolerated the procedure well and he returns to the recovery room postop.

## 2013-11-06 NOTE — Significant Event (Signed)
Rapid Response Event Note Called by primary RN to see pt with c/o abd pain & feeling of fullness & pressure Overview: Time Called: 2210 Arrival Time: 2214 Event Type: Other (Comment)  Initial Focused Assessment: On arrival pt is resting in bed with c/o pressure along his rib cage & in his abd.  No bruising noted.  Tender to palpation across upper abd.  Abd is tense but not distended.  Pt states he feels like he can't relax the muscles in his abd.  Pt recently ate a hamburger without difficulty or N/V.  Pt has hyperactive BS, denies flatus, normal BM this am prior to surgery.Primary RN gave Maalox earlier & is now giving tylenol for musculoskeletal pain.  Will cont. To monitor.  Interventions:   Event Summary:   at      at          [facility], Aleeha Boline Hedgecock

## 2013-11-07 MED ORDER — NICOTINE 21 MG/24HR TD PT24
21.0000 mg | MEDICATED_PATCH | Freq: Every day | TRANSDERMAL | Status: DC
  Administered 2013-11-07: 21 mg via TRANSDERMAL
  Filled 2013-11-07: qty 1

## 2013-11-07 MED ORDER — METOCLOPRAMIDE HCL 5 MG PO TABS
5.0000 mg | ORAL_TABLET | Freq: Once | ORAL | Status: AC
  Administered 2013-11-07: 5 mg via ORAL
  Filled 2013-11-07: qty 1

## 2013-11-07 MED FILL — Electrolyte-R (PH 7.4) Solution: INTRAVENOUS | Qty: 1000 | Status: AC

## 2013-11-07 MED FILL — Heparin Sodium (Porcine) Inj 1000 Unit/ML: INTRAMUSCULAR | Qty: 30 | Status: AC

## 2013-11-07 MED FILL — Sodium Chloride Irrigation Soln 0.9%: Qty: 3000 | Status: AC

## 2013-11-07 NOTE — Evaluation (Signed)
Occupational Therapy Evaluation Patient Details Name: Dillon PellegriniJohn D Macdonald MRN: [id number] DOB: 10/11/1970 Today's Date: 11/07/2013    History of Present Illness 43 yo male s/p PLIF L2-3 and L4-T10 PLA    Clinical Impression   Patient is s/p PLIF  surgery resulting in functional limitations due to the deficits listed below (see OT problem list). PTA independent. Patient will benefit from skilled OT acutely to increase independence and safety with ADLS to allow discharge HHOT and bench. Ot to follow acutely for adls retraining with back precautions.      Follow Up Recommendations  Home health OT    Equipment Recommendations  Tub/shower bench    Recommendations for Other Services       Precautions / Restrictions Precautions Precautions: Back Precaution Comments: handout provided Required Braces or Orthoses: Spinal Brace Spinal Brace: Lumbar corset;Applied in sitting position      Mobility Bed Mobility Overal bed mobility: Needs Assistance Bed Mobility: Rolling;Sidelying to Sit;Supine to Sit Rolling: Max assist Sidelying to sit: Max assist;HOB elevated Supine to sit: Max assist;HOB elevated     General bed mobility comments: Pt required HOB elevated to 50 degrees to initiate eob sitting. pt with severe pain and resisting with initial attempt. pt required bed elevated for attempt at sit<>stand  Transfers Overall transfer level: Needs assistance Equipment used: Standard walker Transfers: Sit to/from Stand Sit to Stand: Max assist;From elevated surface         General transfer comment: pt with bed elevated and almost standing with anterior weight shift to motivate patietn to keep progressing.     Balance Overall balance assessment: Needs assistance Sitting-balance support: Bilateral upper extremity supported;Feet supported Sitting balance-Leahy Scale: Fair     Standing balance support: Bilateral upper extremity supported;During functional activity Standing  balance-Leahy Scale: Poor                              ADL Overall ADL's : Needs assistance/impaired     Grooming: Minimal assistance;Standing Grooming Details (indicate cue type and reason): heavily leaning on counter surface Upper Body Bathing: Moderate assistance;Sitting   Lower Body Bathing: Total assistance           Toilet Transfer: SolicitorMin guard;Regular Toilet (static standing to void bladder)   Toileting- Clothing Manipulation and Hygiene: Maximal assistance;Sit to/from stand       Functional mobility during ADLs: Moderate assistance;Rolling walker General ADL Comments: Pt educated on back preacutions and bed mobility. pt c/o severe pain with all mobility. pt completed oral care and toilet transfer. Pt positioned in chair with handout awaiting PT session. pt encouraged to sit up. Pt blelching only during session. Pt c/o abdomen discomfort     Vision                     Perception     Praxis      Pertinent Vitals/Pain 7 out 10 sitting in chair RN called for pain medication during session     Hand Dominance Right   Extremity/Trunk Assessment Upper Extremity Assessment Upper Extremity Assessment: Overall WFL for tasks assessed   Lower Extremity Assessment Lower Extremity Assessment: Defer to PT evaluation   Cervical / Trunk Assessment Cervical / Trunk Assessment: Other exceptions (surg)   Communication Communication Communication: No difficulties   Cognition Arousal/Alertness: Awake/alert Behavior During Therapy: WFL for tasks assessed/performed Overall Cognitive Status: Within Functional Limits for tasks assessed  General Comments       Exercises       Shoulder Instructions      Home Living Family/patient expects to be discharged to:: Private residence Living Arrangements: Spouse/significant other Available Help at Discharge: Other (Comment) (girlfriend works from home) Type of Home: House Home  Access: Stairs to enter     Home Layout: Two level;Able to live on main level with bedroom/bathroom     Bathroom Shower/Tub: Tub/shower unit   Bathroom Toilet: Handicapped height     Home Equipment: Environmental consultant - 4 wheels;Cane - single point;Other (comment);Wheelchair - manual (hoyer lift)   Additional Comments: pt purchased hoyer lift to help "pull" himself into standing. pt educated that he is not allowed to use this equipment upon d/c due to precautions.      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    OT Diagnosis: Generalized weakness;Acute pain   OT Problem List: Decreased strength;Decreased activity tolerance;Impaired balance (sitting and/or standing);Decreased safety awareness;Decreased knowledge of use of DME or AE;Decreased knowledge of precautions;Pain   OT Treatment/Interventions: Self-care/ADL training;Therapeutic exercise;DME and/or AE instruction;Therapeutic activities;Patient/family education;Balance training    OT Goals(Current goals can be found in the care plan section) Acute Rehab OT Goals Patient Stated Goal: none specifically stated OT Goal Formulation: With patient Time For Goal Achievement: 11/21/13 Potential to Achieve Goals: Good  OT Frequency: Min 2X/week   Barriers to D/C:            Co-evaluation              End of Session Equipment Utilized During Treatment: Gait belt;Rolling walker;Back brace Nurse Communication: Mobility status;Precautions  Activity Tolerance: Patient limited by pain Patient left: in chair;with call bell/phone within reach   Time: 1419-1446 OT Time Calculation (min): 27 min Charges:  OT General Charges $OT Visit: 1 Procedure OT Evaluation $Initial OT Evaluation Tier I: 1 Procedure OT Treatments $Self Care/Home Management : 23-37 mins G-Codes:    Harolyn Rutherford 11/24/13, 3:47 PM Pager: [phone]

## 2013-11-07 NOTE — Progress Notes (Signed)
Postop day 1.  Patient complains of incisional back pain also having gassy pain is in his upper abdomen. Patient having no radicular pain. Denies any lower extremity numbness, tingling or weakness. No flatus.  Afebrile. Vitals are stable. Urine output good. Drain output moderately high last night has lessened through the course of today. Patient awake and alert. He is obviously uncomfortable. Chest is cord auscultation. Heart is recurring rhythm. Abdomen is tense with absent bowel sounds. Straight leg raising negative bilaterally. Motor and sensory function extremities normal.  Overall doing as would be expected following multilevel thoracic lumbar decompression and fusion. Begin the mobilize. Treat his ileus symptomatically for now.

## 2013-11-07 NOTE — Evaluation (Signed)
Physical Therapy Evaluation Patient Details Name: Dillon PellegriniJohn D Macdonald MRN: [id number] DOB: 07/13/1970 Today's Date: 11/07/2013   History of Present Illness  43 yo male s/p PLIF L2-3 and L4-T10 PLA   Clinical Impression  Pt c/o of intense gas pains. Pt did tolerate ambulation well in attempt to facilitate any movement of bowels. Anticipate pt to progress well once he can pass gas.    Follow Up Recommendations No PT follow up;Supervision/Assistance - 24 hour    Equipment Recommendations  None recommended by PT    Recommendations for Other Services       Precautions / Restrictions Precautions Precautions: Back Precaution Booklet Issued: Yes (comment) Precaution Comments: pt re-educated on back prec, unable to recall any of them Required Braces or Orthoses: Spinal Brace Spinal Brace: Lumbar corset;Applied in sitting position Restrictions Weight Bearing Restrictions: No      Mobility  Bed Mobility Overal bed mobility: Needs Assistance Bed Mobility: Sit to Sidelying Rolling: Max assist Sidelying to sit: Max assist;HOB elevated Supine to sit: Max assist;HOB elevated   Sit to sidelying: Min assist General bed mobility comments: pt up in chair upon PT arrival. pt required max directional v/c's for safe return to supine in bed and assist for LE management  Transfers Overall transfer level: Needs assistance Equipment used: Rolling walker (2 wheeled) Transfers: Sit to/from Stand Sit to Stand: Min assist         General transfer comment: v/c's for hand placement  Ambulation/Gait Ambulation/Gait assistance: Min guard Ambulation Distance (Feet): 200 Feet Assistive device: Rolling walker (2 wheeled) Gait Pattern/deviations: Step-through pattern;Decreased stride length;Trunk flexed     General Gait Details: increased bilat UE WBing, v/c's to dec trunk flex  Stairs            Wheelchair Mobility    Modified Rankin (Stroke Patients Only)       Balance Overall  balance assessment: Needs assistance Sitting-balance support: Bilateral upper extremity supported;Feet supported Sitting balance-Leahy Scale: Fair     Standing balance support: Bilateral upper extremity supported Standing balance-Leahy Scale: Poor                               Pertinent Vitals/Pain 10/10 gas pain an back pain, RN provided nausea and pain medicine    Home Living Family/patient expects to be discharged to:: Private residence Living Arrangements: Spouse/significant other Available Help at Discharge: Other (Comment) Type of Home: House Home Access: Stairs to enter Entrance Stairs-Rails: Can reach both Entrance Stairs-Number of Steps:  (3) Home Layout: Two level;Laundry or work area in Pitney Bowesbasement Home Equipment: Environmental consultantWalker - 4 wheels;Cane - single point;Other (comment);Wheelchair - manual Additional Comments: pt purchased hoyer lift to help "pull" himself into standing. pt educated that he is not allowed to use this equipment upon d/c due to precautions.    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     Hand Dominance   Dominant Hand: Right    Extremity/Trunk Assessment   Upper Extremity Assessment: Overall WFL for tasks assessed           Lower Extremity Assessment: Overall WFL for tasks assessed      Cervical / Trunk Assessment:  (back surgery)  Communication   Communication: No difficulties  Cognition Arousal/Alertness: Awake/alert Behavior During Therapy: [facility] for  tasks assessed/performed Overall Cognitive Status: Within Functional Limits for tasks assessed                      General Comments      Exercises        Assessment/Plan    PT Assessment Patient needs continued PT services  PT Diagnosis Difficulty walking;Generalized weakness   PT Problem List Decreased  strength;Decreased activity tolerance;Decreased balance;Decreased mobility  PT Treatment Interventions DME instruction;Gait training;Stair training;Functional mobility training;Therapeutic activities;Therapeutic exercise   PT Goals (Current goals can be found in the Care Plan section) Acute Rehab PT Goals Patient Stated Goal: to get ride of gas PT Goal Formulation: With patient Time For Goal Achievement: 11/14/13 Potential to Achieve Goals: Good    Frequency Min 5X/week   Barriers to discharge        Co-evaluation               End of Session Equipment Utilized During Treatment: Gait belt Activity Tolerance: Patient tolerated treatment well Patient left: in bed;with call bell/phone within reach Nurse Communication: Mobility status         Time: 1510-1550 PT Time Calculation (min): 40 min   Charges:   PT Evaluation $Initial PT Evaluation Tier I: 1 Procedure PT Treatments $Gait Training: 8-22 mins $Therapeutic Activity: 8-22 mins   PT G Codes:          Marcene Brawn 11/07/2013, 4:55 PM   Lewis Shock, PT, DPT Pager #: [phone] Office #: [phone]

## 2013-11-07 NOTE — Progress Notes (Signed)
Around shift change, pt c/o pain in the abdomen and also felt like he had urine retention even though he had a foley.  I assessed him and flushed his foley and it started to drain. After a while, the foley stopped draining and he c/o pain again. Bladder scan was done with results of 29 ml. Then pt said the was ok in his lower abdomen, but then it was in his upper abdomen now. I gave him maalox. Pt said the 'pain felt like a balloon in his stomach and there may be something happening in there". I got charge RN to assess him too. Rapid response also came to see him. During the night pt said the pain was better, but this AM he said he's being having the same pain which never went away, and the PCA wasn't working. I have stopped the PCA and gave him percocert and flexiril. Endorsed to on coming RN. 

## 2013-11-07 NOTE — Progress Notes (Signed)
CARE MANAGEMENT NOTE 11/07/2013  Patient:  Princella PellegriniWEAVER,Shrihan D   Account Number:  [id number]  Date Initiated:  11/07/2013  Documentation initiated by:  Jiles CrockerHANDLER,Juron Vorhees  Subjective/Objective Assessment:   ADMITTED FOR SURGERY     Action/Plan:   CM FOLLOWING FOR DCP   Anticipated DC Date:  11/10/2013   Anticipated DC Plan:  AWAITING FOR PT/OT EVALS FOR DISPOSITION NEEDS     DC Planning Services  CM consult          Status of service:  In process, will continue to follow Medicare Important Message given?   (If response is "NO", the following Medicare IM given date fields will be blank)  Per UR Regulation:  Reviewed for med. necessity/level of care/duration of stay  Comments:  7/28/2015Abelino Derrick- B Denell Cothern RN,BSN,MHA [phone]

## 2013-11-07 NOTE — Addendum Note (Signed)
Addendum created 11/07/13 0818 by Lovie CholJennifer K Abbegale Stehle, CRNA   Modules edited: Anesthesia Flowsheet, Anesthesia Medication Administration

## 2013-11-08 ENCOUNTER — Inpatient Hospital Stay (HOSPITAL_COMMUNITY): Payer: BC Managed Care – PPO

## 2013-11-08 LAB — BASIC METABOLIC PANEL
Anion gap: 12 (ref 5–15)
BUN: 11 mg/dL (ref 6–23)
CO2: 27 mEq/L (ref 19–32)
Calcium: 8.9 mg/dL (ref 8.4–10.5)
Chloride: 97 mEq/L (ref 96–112)
Creatinine, Ser: 0.76 mg/dL (ref 0.50–1.35)
GFR calc Af Amer: >90 mL/min (ref >90)
GFR calc non Af Amer: >90 mL/min (ref >90)
Glucose, Bld: 109 mg/dL — ABNORMAL HIGH (ref 70–99)
Potassium: 4.4 mEq/L (ref 3.7–5.3)
Sodium: 136 mEq/L — ABNORMAL LOW (ref 137–147)

## 2013-11-08 LAB — CBC
HCT: 31.9 % — ABNORMAL LOW (ref 39.0–52.0)
Hemoglobin: 10.9 g/dL — ABNORMAL LOW (ref 13.0–17.0)
MCH: 30.4 pg (ref 26.0–34.0)
MCHC: 34.2 g/dL (ref 30.0–36.0)
MCV: 89.1 fL (ref 78.0–100.0)
Platelets: 156 10*3/uL (ref 150–400)
RBC: 3.58 MIL/uL — ABNORMAL LOW (ref 4.22–5.81)
RDW: 14.1 % (ref 11.5–15.5)
WBC: 9.8 10*3/uL (ref 4.0–10.5)

## 2013-11-08 MED ORDER — DIAZEPAM 5 MG PO TABS
5.0000 mg | ORAL_TABLET | Freq: Two times a day (BID) | ORAL | Status: DC | PRN
  Administered 2013-11-08 – 2013-11-09 (×2): 5 mg via ORAL
  Filled 2013-11-08 (×2): qty 1

## 2013-11-08 MED ORDER — KETOROLAC TROMETHAMINE 30 MG/ML IJ SOLN: 30.0000 mg | Freq: Four times a day (QID) | INTRAMUSCULAR | Status: DC | PRN

## 2013-11-08 NOTE — Progress Notes (Signed)
Occupational Therapy Treatment Patient Details Name: Dillon Macdonald MRN: [id number] DOB: 1970/10/22 Today's Date: 11/08/2013    History of present illness 43 yo male s/p PLIF L2-3 and L4-T10 PLA    OT comments  Pt limited this session by stomach pain due to ileus. Pt reports some passing gas but mainly pain. RN and MD aware. OT attempted toilet transfer this session and pt was unable to void bowels. Pt did void bladder.   Follow Up Recommendations  Home health OT    Equipment Recommendations  Tub/shower bench    Recommendations for Other Services      Precautions / Restrictions Precautions Precautions: Back Precaution Comments: recalled 3 out3 precautions Required Braces or Orthoses: Spinal Brace Spinal Brace: Lumbar corset;Applied in sitting position       Mobility Bed Mobility               General bed mobility comments: in chair on arrival  Transfers Overall transfer level: Needs assistance Equipment used: Rolling walker (2 wheeled) Transfers: Sit to/from Stand Sit to Stand: Min guard         General transfer comment: cues for safetyand proper hand placement    Balance Overall balance assessment: Needs assistance                           High level balance activites: Other (comment) (requires x5 rest breaks during ambulation)     ADL Overall ADL's : Needs assistance/impaired Eating/Feeding: Modified independent;Sitting                       Toilet Transfer: Min guard;BSC;RW           Functional mobility during ADLs: Min guard;Rolling walker General ADL Comments: pt reports requiring (A) from tech to don brace this AM. pt very painful in abdomen and reports "my stomach hurts more than by back now" Pt ambulating unit x2 prior to toilet transfer attempting to help with ileus. Session focused on abdomen relief due to biggest limiting factor for progression with therapy.      Vision                     Perception      Praxis      Cognition   Behavior During Therapy: [facility] for tasks assessed/performed Overall Cognitive Status: Within Functional Limits for tasks assessed                       Extremity/Trunk Assessment               Exercises     Shoulder Instructions       General Comments      Pertinent Vitals/ Pain       Extreme stomach pain. NOt rated  Home Living                                          Prior Functioning/Environment              Frequency Min 2X/week     Progress Toward Goals  OT Goals(current goals can now be found in the care plan section)  Progress towards OT goals: Progressing toward goals  Acute Rehab OT Goals Patient Stated Goal: to get rid of gas OT Goal Formulation: With patient Time For Goal  Achievement: 11/21/13 Potential to Achieve Goals: Good ADL Goals Pt Will Perform Grooming: with supervision;standing Pt Will Perform Upper Body Bathing: with supervision;standing Pt Will Perform Lower Body Bathing: with min assist;sit to/from stand Pt Will Transfer to Toilet: with supervision;bedside commode;ambulating Pt Will Perform Tub/Shower Transfer: with supervision;tub bench;rolling walker Additional ADL Goal #1: Pt will complete don doff brace mod i  Plan Discharge plan remains appropriate    Co-evaluation                 End of Session Equipment Utilized During Treatment: Gait belt;Rolling walker   Activity Tolerance Patient limited by pain   Patient Left in chair;with call bell/phone within reach   Nurse Communication Mobility status;Precautions        Time: [id number] OT Time Calculation (min): 34 min  Charges: OT General Charges $OT Visit: 1 Procedure OT Treatments $Self Care/Home Management : 23-37 mins  Boone MasterJones, Phylisha Dix B 11/08/2013, 10:27 AM Pager: [phone]

## 2013-11-08 NOTE — Progress Notes (Signed)
Patient stated he wanted to be left alone to sleep tonight. He refuses vitals signs and neuro checks until AM.

## 2013-11-08 NOTE — Progress Notes (Signed)
Physical Therapy Treatment Patient Details Name: Dillon PellegriniJohn D Macdonald MRN: [id number] DOB: 05/18/1970 Today's Date: 11/08/2013    History of Present Illness 43 yo male s/p PLIF L2-3 and L4-T10 PLA     PT Comments    Pt progressing extremely well. Pt c/o stomach/gas pains. Acute PT to con't to follow to progress amb without RW.  Follow Up Recommendations  No PT follow up;Supervision/Assistance - 24 hour     Equipment Recommendations  None recommended by PT    Recommendations for Other Services       Precautions / Restrictions Precautions Precautions: Back Precaution Booklet Issued: Yes (comment) Precaution Comments: recalled 3 out3 precautions Required Braces or Orthoses: Spinal Brace Spinal Brace: Lumbar corset;Applied in sitting position Restrictions Weight Bearing Restrictions: No    Mobility  Bed Mobility               General bed mobility comments: in chair  Transfers Overall transfer level: Needs assistance Equipment used: Rolling walker (2 wheeled) Transfers: Sit to/from Stand Sit to Stand: Supervision         General transfer comment: v/c's for hand placement  Ambulation/Gait Ambulation/Gait assistance: Supervision Ambulation Distance (Feet): 600 Feet Assistive device: Rolling walker (2 wheeled) Gait Pattern/deviations: Step-through pattern     General Gait Details: v/c's to decrease bilat UE WBing on RW   Stairs Stairs: Yes Stairs assistance: Supervision Stair Management: Two rails Number of Stairs: 6 General stair comments: pt with good technique  Wheelchair Mobility    Modified Rankin (Stroke Patients Only)       Balance                                    Cognition Arousal/Alertness: Awake/alert Behavior During Therapy: WFL for tasks assessed/performed Overall Cognitive Status: Within Functional Limits for tasks assessed                      Exercises      General Comments        Pertinent  Vitals/Pain 8/10 abd pain    Home Living                      Prior Function            PT Goals (current goals can now be found in the care plan section) Progress towards PT goals: Progressing toward goals    Frequency  Min 3X/week    PT Plan Current plan remains appropriate;Frequency needs to be updated    Co-evaluation             End of Session   Activity Tolerance: Patient tolerated treatment well       Time: [id number] PT Time Calculation (min): 14 min  Charges:  $Gait Training: 8-22 mins                    G Codes:      Dillon BrawnChadwell, Dillon Macdonald 11/08/2013, 3:23 PM  Dillon ShockAshly Christoph Macdonald, PT, DPT Pager #: [phone] Office #: [phone]

## 2013-11-08 NOTE — Progress Notes (Signed)
Postop day 2. Patient's back pain actually very well controlled. No radicular pain. No lower extremity symptoms. Continues to complain of gas pain in his abdomen. No flatus still. No emesis either.  Afebrile. Vitals are stable. Motor and sensory intact. Abdomen still tends. Bowel sounds hypoactive. Chest clear.  Status post thoracic lumbar fusion. Patient overall progressing in mobilizing well with therapy. Main issue appears to be ileus. We'll check x-ray today and also get lab work. Patient may need be restarted on IV fluids.

## 2013-11-09 MED ORDER — OXYCODONE-ACETAMINOPHEN 5-325 MG PO TABS: 1.0000 | ORAL_TABLET | ORAL | Status: DC | PRN

## 2013-11-09 MED ORDER — DIAZEPAM 5 MG PO TABS: 5.0000 mg | ORAL_TABLET | Freq: Four times a day (QID) | ORAL | Status: DC | PRN

## 2013-11-09 NOTE — Progress Notes (Signed)
Discharge instructions given. Pt verbalized understanding. All questions were answered. Pt's girlfriend will be here soon to take him home.

## 2013-11-09 NOTE — Discharge Instructions (Signed)

## 2013-11-09 NOTE — Discharge Summary (Signed)
Physician Discharge Summary  Patient ID: Dillon PellegriniJohn D Musleh MRN: [id number] DOB/AGE: 41/11/1970 41 y.o.  Admit date: 11/06/2013 Discharge date: 11/09/2013  Admission Diagnoses:  Discharge Diagnoses:  Principal Problem:   Spinal stenosis, lumbar region, with neurogenic claudication Active Problems:   Lumbar stenosis with neurogenic claudication   Discharged Condition: good  Hospital Course: Patient admitted to the hospital where he underwent uncomplicated T10-L4 decompression and fusion. Postoperatively he is done very well. His preoperative back and lower extremity symptoms have resolved. He is up ambulating with minimal difficulty. He had some temporary difficulty with a postoperative ileus which is now past. Patient is passing flatus and had a small stool. He feels ready for discharge home.    Consults:   Significant Diagnostic Studies:   Treatments:   Discharge Exam: Blood pressure 124/69, pulse 81, temperature 98.1 F (36.7 C), temperature source Oral, resp. rate 18, height 5\' 8"  (1.727 m), weight 86.183 kg (190 lb), SpO2 100.00%. Awake and alert. Oriented and appropriate. Motor and sensory function intact. Wound clean and dry. Chest and abdomen benign.  Disposition: 01-Home or Self Care  Discharge Instructions   Discontinue JP/Blake drain    Complete by:  As directed             Medication List         aspirin EC 325 MG tablet  Take 325 mg by mouth daily.     diazepam 5 MG tablet  Commonly known as:  VALIUM  Take 1-2 tablets (5-10 mg total) by mouth every 6 (six) hours as needed for muscle spasms.     ondansetron 4 MG tablet  Commonly known as:  ZOFRAN  Take 4 mg by mouth every 6 (six) hours as needed (for nausea caused by Percocet).     oxyCODONE-acetaminophen 5-325 MG per tablet  Commonly known as:  PERCOCET/ROXICET  Take 1-2 tablets by mouth every 4 (four) hours as needed for moderate pain or severe pain.     valACYclovir 1000 MG tablet  Commonly known  as:  VALTREX  Take 1,000 mg by mouth every morning.         Signed: Salvatore Shear A 11/09/2013, 8:17 AM

## 2013-11-09 NOTE — Progress Notes (Signed)
Occupational Therapy Treatment Patient Details Name: Dillon Macdonald MRN: [id number] DOB: 10/18/1970 Today's Date: 11/09/2013    History of present illness 43 yo male s/p PLIF L2-3 and L4-T10 PLA    OT comments  Pt is at adequate level for d/c home. Pt is currently MOD I with ambulation in room. Pt has (A) from girlfriend upon d/c home.   Follow Up Recommendations  No OT follow up    Equipment Recommendations  Tub/shower bench    Recommendations for Other Services      Precautions / Restrictions Precautions Precautions: Back Required Braces or Orthoses: Spinal Brace Spinal Brace: Lumbar corset;Applied in sitting position       Mobility Bed Mobility               General bed mobility comments: in bathroom bathing at sink on arrival  Transfers Overall transfer level: Needs assistance Equipment used: Rolling walker (2 wheeled) Transfers: Sit to/from Stand Sit to Stand: Supervision         General transfer comment: educated on not pulling on RW. Pt plans to use lift chair at home. pt advised not to use lift to chair due to continued use can create weakness in bil LE. pt encouraged to power up with legs to help with activity tolerance / BIL LE strength    Balance Overall balance assessment: Needs assistance         Standing balance support: During functional activity;No upper extremity supported Standing balance-Leahy Scale: Normal                     ADL Overall ADL's : Needs assistance/impaired Eating/Feeding: Modified independent   Grooming: Wash/dry face;Oral care;Supervision/safety               Lower Body Dressing: Supervision/safety;Sit to/from stand;With adaptive equipment   Toilet Transfer: Supervision/safety;RW           Functional mobility during ADLs: Supervision/safety General ADL Comments: pt educated on AE and pt does not plan to purchase. pt has reacher at home already. pt plans to have girlfriend (A) for LB as needed. Pt  don doff back brace mod I. Pt is at adequate level for d/c home      Vision                     Perception     Praxis      Cognition   Behavior During Therapy: [facility] for tasks assessed/performed Overall Cognitive Status: Within Functional Limits for tasks assessed                       Extremity/Trunk Assessment               Exercises     Shoulder Instructions       General Comments      Pertinent Vitals/ Pain       C/o abdominal discomfort due to inability to void bowels.  Home Living                                          Prior Functioning/Environment              Frequency Min 2X/week     Progress Toward Goals  OT Goals(current goals can now be found in the care plan section)  Progress towards OT goals: Progressing toward  goals  Acute Rehab OT Goals Patient Stated Goal: to get rid of gas OT Goal Formulation: With patient Time For Goal Achievement: 11/21/13 Potential to Achieve Goals: Good ADL Goals Pt Will Perform Grooming: with supervision;standing Pt Will Perform Upper Body Bathing: with supervision;standing Pt Will Perform Lower Body Bathing: with min assist;sit to/from stand Pt Will Transfer to Toilet: with supervision;bedside commode;ambulating Pt Will Perform Tub/Shower Transfer: with supervision;tub bench;rolling walker Additional ADL Goal #1: Pt will complete don doff brace mod i  Plan Discharge plan needs to be updated    Co-evaluation                 End of Session Equipment Utilized During Treatment: Rolling walker   Activity Tolerance Patient tolerated treatment well   Patient Left in chair;with call bell/phone within reach   Nurse Communication Mobility status;Precautions        Time: [id number] OT Time Calculation (min): 24 min  Charges: OT General Charges $OT Visit: 1 Procedure OT Treatments $Self Care/Home Management : 23-37 mins  Harolyn RutherfordJones, Chidera Thivierge B 11/09/2013, 9:46  AM Pager: [phone]

## 2013-11-09 NOTE — Progress Notes (Signed)
Talked to patient about DCP; no needs identified; Alexis GoodellB Celese Banner RN,BSN,MHA [phone]

## 2013-11-13 LAB — POCT I-STAT 4, (NA,K, GLUC, HGB,HCT)
Glucose, Bld: 107 mg/dL — ABNORMAL HIGH (ref 70–99)
HCT: 33 % — ABNORMAL LOW (ref 39.0–52.0)
Hemoglobin: 11.2 g/dL — ABNORMAL LOW (ref 13.0–17.0)
Potassium: 4.9 mEq/L (ref 3.7–5.3)
Sodium: 137 mEq/L (ref 137–147)

## 2014-07-23 ENCOUNTER — Other Ambulatory Visit: Payer: Self-pay | Admitting: Physician Assistant

## 2014-07-23 ENCOUNTER — Ambulatory Visit
Admission: RE | Admit: 2014-07-23 | Discharge: 2014-07-23 | Disposition: A | Discharge status: Home / Self Care | Payer: BLUE CROSS/BLUE SHIELD | Source: Ambulatory Visit | Attending: Physician Assistant | Admitting: Physician Assistant

## 2014-07-23 DIAGNOSIS — R05 Cough: Secondary | ICD-10-CM

## 2014-07-23 DIAGNOSIS — R059 Cough, unspecified: Secondary | ICD-10-CM

## 2014-12-23 ENCOUNTER — Emergency Department (HOSPITAL_BASED_OUTPATIENT_CLINIC_OR_DEPARTMENT_OTHER): Payer: BLUE CROSS/BLUE SHIELD

## 2014-12-23 ENCOUNTER — Emergency Department (HOSPITAL_BASED_OUTPATIENT_CLINIC_OR_DEPARTMENT_OTHER)
Admission: EM | Admit: 2014-12-23 | Discharge: 2014-12-23 | Disposition: A | Discharge status: Home / Self Care | Payer: BLUE CROSS/BLUE SHIELD | Attending: Emergency Medicine | Admitting: Emergency Medicine

## 2014-12-23 ENCOUNTER — Encounter (HOSPITAL_BASED_OUTPATIENT_CLINIC_OR_DEPARTMENT_OTHER): Payer: Self-pay | Admitting: *Deleted

## 2014-12-23 DIAGNOSIS — Y288XXA Contact with other sharp object, undetermined intent, initial encounter: Secondary | ICD-10-CM | POA: Insufficient documentation

## 2014-12-23 DIAGNOSIS — Y9289 Other specified places as the place of occurrence of the external cause: Secondary | ICD-10-CM | POA: Insufficient documentation

## 2014-12-23 DIAGNOSIS — Z7982 Long term (current) use of aspirin: Secondary | ICD-10-CM | POA: Diagnosis not present

## 2014-12-23 DIAGNOSIS — Z8619 Personal history of other infectious and parasitic diseases: Secondary | ICD-10-CM | POA: Diagnosis not present

## 2014-12-23 DIAGNOSIS — S61210A Laceration without foreign body of right index finger without damage to nail, initial encounter: Secondary | ICD-10-CM | POA: Diagnosis present

## 2014-12-23 DIAGNOSIS — Z862 Personal history of diseases of the blood and blood-forming organs and certain disorders involving the immune mechanism: Secondary | ICD-10-CM | POA: Diagnosis not present

## 2014-12-23 DIAGNOSIS — Z720 Tobacco use: Secondary | ICD-10-CM | POA: Diagnosis not present

## 2014-12-23 DIAGNOSIS — Z79899 Other long term (current) drug therapy: Secondary | ICD-10-CM | POA: Diagnosis not present

## 2014-12-23 DIAGNOSIS — IMO0002 Reserved for concepts with insufficient information to code with codable children: Secondary | ICD-10-CM

## 2014-12-23 DIAGNOSIS — Y998 Other external cause status: Secondary | ICD-10-CM | POA: Diagnosis not present

## 2014-12-23 DIAGNOSIS — Y9389 Activity, other specified: Secondary | ICD-10-CM | POA: Diagnosis not present

## 2014-12-23 MED ORDER — PENTAFLUOROPROP-TETRAFLUOROETH EX AERO: INHALATION_SPRAY | Freq: Once | CUTANEOUS | Status: DC

## 2014-12-23 MED ORDER — BUPIVACAINE HCL 0.5 % IJ SOLN
15.0000 mL | Freq: Once | INTRAMUSCULAR | Status: DC
  Filled 2014-12-23: qty 1

## 2014-12-23 MED ORDER — CEPHALEXIN 250 MG PO CAPS
ORAL_CAPSULE | ORAL | Status: AC
  Filled 2014-12-23: qty 1

## 2014-12-23 MED ORDER — LIDOCAINE-EPINEPHRINE-TETRACAINE (LET) SOLUTION
3.0000 mL | Freq: Once | NASAL | Status: AC
  Administered 2014-12-23: 3 mL via TOPICAL
  Filled 2014-12-23: qty 3

## 2014-12-23 MED ORDER — CEPHALEXIN 250 MG PO CAPS
500.0000 mg | ORAL_CAPSULE | Freq: Once | ORAL | Status: AC
  Administered 2014-12-23: 500 mg via ORAL
  Filled 2014-12-23: qty 2

## 2014-12-23 MED ORDER — PENTAFLUOROPROP-TETRAFLUOROETH EX AERO
INHALATION_SPRAY | CUTANEOUS | Status: AC
  Administered 2014-12-23: 22:00:00
  Filled 2014-12-23: qty 30

## 2014-12-23 MED ORDER — BUPIVACAINE HCL 0.5 % IJ SOLN
INTRAMUSCULAR | Status: AC
  Administered 2014-12-23: 22:00:00
  Filled 2014-12-23: qty 1

## 2014-12-23 MED ORDER — LIDOCAINE-EPINEPHRINE-TETRACAINE (LET) SOLUTION
NASAL | Status: AC
  Administered 2014-12-23: 3 mL via TOPICAL
  Filled 2014-12-23: qty 3

## 2014-12-23 MED ORDER — PENTAFLUOROPROP-TETRAFLUOROETH EX AERO
INHALATION_SPRAY | CUTANEOUS | Status: DC
Start: 2014-12-23 — End: 2014-12-23
  Filled 2014-12-23: qty 30

## 2014-12-23 MED ORDER — CEPHALEXIN 500 MG PO CAPS: 500.0000 mg | ORAL_CAPSULE | Freq: Two times a day (BID) | ORAL | Status: DC

## 2014-12-23 NOTE — ED Notes (Signed)
Pt reports he was operating a drill press when the metal slipped and cut his R forefinger. Accident occurred around 1130 -- finger wrapped with gauze and Coban (bleeding controlled).

## 2014-12-23 NOTE — ED Provider Notes (Signed)
CSN: [id number]     Arrival date & time 12/23/14  2018 History   First MD Initiated Contact with Patient 12/23/14 2040     Chief Complaint  Patient presents with  . Laceration    finger     (Consider location/radiation/quality/duration/timing/severity/associated sxs/prior Treatment) HPI   Blood pressure 135/96, pulse 88, temperature 98 F (36.7 C), temperature source Oral, resp. rate 18, height 5\' 8"  (1.727 m), weight 188 lb (85.276 kg), SpO2 97 %.  Dillon Macdonald is a 44 y.o. male complaining of Laceration to right pointer finger occurring today at 11:30 AM. Patient states he was using a drill press without gloves and the metal shavings cut the radial side of the finger. Patient states that he washed it with hydrogen peroxide and applied Steri-Strips but the wound continued to stay open. States his pain is minimal he denies numbness, weakness, reduced range of motion. States last tetanus shot was one year ago.  Past Medical History  Diagnosis Date  . Back pain   . Shortness of breath     feels that way all the time, ? pain or Percocet  . Thrombocytopenia 2014  . Herpes simplex    Past Surgical History  Procedure Laterality Date  . Back surgery      lumbar fusion L3- 4, S 1  . Cervical fusion  2014  . Vasectomy     No family history on file. Social History  Substance Use Topics  . Smoking status: Current Every Day Smoker -- 2.00 packs/day for 20 years    Types: Cigarettes  . Smokeless tobacco: Never Used  . Alcohol Use: No    Review of Systems  10 systems reviewed and found to be negative, except as noted in the HPI.   Allergies  Review of patient's allergies indicates no known allergies.  Home Medications   Prior to Admission medications   Medication Sig Start Date End Date Taking? Authorizing Provider  aspirin EC 325 MG tablet Take 325 mg by mouth daily.   Yes Historical Provider, MD  cyclobenzaprine (FLEXERIL) 5 MG tablet Take 5 mg by mouth 3 (three) times  daily as needed for muscle spasms.   Yes Historical Provider, MD  oxyCODONE-acetaminophen (PERCOCET/ROXICET) 5-325 MG per tablet Take 1-2 tablets by mouth every 4 (four) hours as needed for moderate pain or severe pain. 11/09/13  Yes Julio Sicks, MD  valACYclovir (VALTREX) 1000 MG tablet Take 1,000 mg by mouth every morning.   Yes Historical Provider, MD  diazepam (VALIUM) 5 MG tablet Take 1-2 tablets (5-10 mg total) by mouth every 6 (six) hours as needed for muscle spasms. 11/09/13   Julio Sicks, MD  ondansetron (ZOFRAN) 4 MG tablet Take 4 mg by mouth every 6 (six) hours as needed (for nausea caused by Percocet).     Historical Provider, MD   BP 135/96 mmHg  Pulse 88  Temp(Src) 98 F (36.7 C) (Oral)  Resp 18  Ht 5\' 8"  (1.727 m)  Wt 188 lb (85.276 kg)  BMI 28.59 kg/m2  SpO2 97% Physical Exam  Constitutional: He is oriented to person, place, and time. He appears well-developed and well-nourished. No distress.  HENT:  Head: Normocephalic.  Eyes: Conjunctivae and EOM are normal.  Cardiovascular: Normal rate.   Pulmonary/Chest: Effort normal. No stridor.  Musculoskeletal: Normal range of motion.  Neurological: He is alert and oriented to person, place, and time.  Skin:  0.5 cm full-thickness flap-like laceration to radial side of distal phalanx right second digit.  Neurovacularly intact with full ROM and strength to each interphalangeal joint (tested in isolation) in both flexion and extension.    Psychiatric: He has a normal mood and affect.  Nursing note and vitals reviewed.   ED Course  LACERATION REPAIR Date/Time: 12/23/2014 10:33 PM Performed by: Wynetta Emery Authorized by: Wynetta Emery Consent: Verbal consent obtained. Consent given by: patient Patient identity confirmed: verbally with patient Body area: upper extremity Location details: right index finger Laceration length: 1.5 cm Foreign bodies: no foreign bodies Tendon involvement: none Nerve involvement:  none Vascular damage: no Anesthesia: digital block Local anesthetic: bupivacaine 0.5% without epinephrine Anesthetic total: 5 ml Patient sedated: no Preparation: Patient was prepped and draped in the usual sterile fashion. Irrigation solution: saline Irrigation method: syringe Amount of cleaning: extensive Debridement: moderate Degree of undermining: minimal Skin closure: Ethilon (5-0) Number of sutures: 7 Suture technique: 6x simple interupted, 1x corner stich. Approximation: close Approximation difficulty: complex Dressing: antibiotic ointment Patient tolerance: Patient tolerated the procedure well with no immediate complications Comments: Wound explored to depth in good light on a bloodless field with no foreign bodies seen or palpated.   (including critical care time) Labs Review Labs Reviewed - No data to display  Imaging Review No results found. I have personally reviewed and evaluated these images and lab results as part of my medical decision-making.   EKG Interpretation None      MDM   Final diagnoses:  Laceration    Filed Vitals:   12/23/14 2024  BP: 135/96  Pulse: 88  Temp: 98 F (36.7 C)  TempSrc: Oral  Resp: 18  Height:  (1.727 m)  Weight: 188 lb (85.276 kg)  SpO2: 97%    Medications  pentafluoroprop-tetrafluoroeth (GEBAUERS) aerosol (not administered)  bupivacaine (MARCAINE) 0.5 % (with pres) injection (not administered)  cephALEXin (KEFLEX) capsule 500 mg (not administered)  lidocaine-EPINEPHrine-tetracaine (LET) solution (3 mLs Topical Given 12/23/14 2133)    [name] is a pleasant 44 y.o. male presenting with flap-like laceration to right second digit distal phalanx. No nerve or tendon involvement. X-ray negative for foreign body. Wound is extensively cleaned and closed. Patient will be started on prophylactic antibiotics. Extensive discussion of wound care and return precautions.  Evaluation does not show pathology that would  require ongoing emergent intervention or inpatient treatment. Pt is hemodynamically stable and mentating appropriately. Discussed findings and plan with patient/guardian, who agrees with care plan. All questions answered. Return precautions discussed and outpatient follow up given.   New Prescriptions   CEPHALEXIN (KEFLEX) 500 MG CAPSULE    Take 1 capsule (500 mg total) by mouth 2 (two) times daily.         Wynetta Emery, PA-C 12/23/14 1610  Vanetta Mulders, MD 12/26/14 [id number]

## 2014-12-23 NOTE — ED Notes (Signed)
ED PA at [facility] 

## 2014-12-23 NOTE — Discharge Instructions (Signed)
Keep wound dry and do not remove dressing for 24 hours if possible. After that, wash gently morning and night (every 12 hours) with soap and water. Use a topical antibiotic ointment and cover with a bandaid or gauze.    Do NOT use rubbing alcohol or hydrogen peroxide, do not soak the area   Present to your primary care doctor or the urgent care of your choice, or the ED for suture removal in 7-10 days.   Every attempt was made to remove foreign body (contaminants) from the wound.  However, there is always a chance that some may remain in the wound. This can  increase your risk of infection.   If you see signs of infection (warmth, redness, tenderness, pus, sharp increase in pain, fever, red streaking in the skin) immediately return to the emergency department.   After the wound heals fully, apply sunscreen for 6-12 months to minimize scarring.    Fingertip Injuries and Amputations Fingertip injuries are common and often get injured because they are last to escape when pulling your hand out of harm's way. You have amputated (cut off) part of your finger. How this turns out depends largely on how much was amputated. If just the tip is amputated, often the end of the finger will grow back and the finger may return to much the same as it was before the injury.  If more of the finger is missing, your caregiver has done the best with the tissue remaining to allow you to keep as much finger as is possible. Your caregiver after checking your injury has tried to leave you with a painless fingertip that has durable, feeling skin. If possible, your caregiver has tried to maintain the finger's length and appearance and preserve its fingernail.  Please read the instructions outlined below and refer to this sheet in the next few weeks. These instructions provide you with general information on caring for yourself. Your caregiver may also give you specific instructions. While your treatment has been done according  to the most current medical practices available, unavoidable complications occasionally occur. If you have any problems or questions after discharge, please call your caregiver. HOME CARE INSTRUCTIONS   You may resume normal diet and activities as directed or allowed.  Keep your hand elevated above the level of your heart. This helps decrease pain and swelling.  Keep ice packs (or a bag of ice wrapped in a towel) on the injured area for 15-20 minutes, 03-04 times per day, for the first two days.  Change dressings if necessary or as directed.  Clean the wound daily or as directed.  Only take over-the-counter or prescription medicines for pain, discomfort, or fever as directed by your caregiver.  Keep appointments as directed. SEEK IMMEDIATE MEDICAL CARE IF:  You develop redness, swelling, numbness or increasing pain in the wound.  There is pus coming from the wound.  You develop an unexplained oral temperature above 102 F (38.9 C) or as your caregiver suggests.  There is a foul (bad) smell coming from the wound or dressing.  There is a breaking open of the wound (edges not staying together) after sutures or staples have been removed. MAKE SURE YOU:   Understand these instructions.  Will watch your condition.  Will get help right away if you are not doing well or get worse. Document Released: 02/18/2005 Document Revised: 06/22/2011 Document Reviewed: 01/18/2008 [facility] Patient Information 2015 [place], [place]. This information is not intended to replace advice given to you  by your health care provider. Make sure you discuss any questions you have with your health care provider.   Laceration Care, Adult A laceration is a cut or lesion that goes through all layers of the skin and into the tissue just beneath the skin. TREATMENT  Some lacerations may not require closure. Some lacerations may not be able to be closed due to an increased risk of infection. It is important to see  your caregiver as soon as possible after an injury to minimize the risk of infection and maximize the opportunity for successful closure. If closure is appropriate, pain medicines may be given, if needed. The wound will be cleaned to help prevent infection. Your caregiver will use stitches (sutures), staples, wound glue (adhesive), or skin adhesive strips to repair the laceration. These tools bring the skin edges together to allow for faster healing and a better cosmetic outcome. However, all wounds will heal with a scar. Once the wound has healed, scarring can be minimized by covering the wound with sunscreen during the day for 1 full year. HOME CARE INSTRUCTIONS  For sutures or staples:  Keep the wound clean and dry.  If you were given a bandage (dressing), you should change it at least once a day. Also, change the dressing if it becomes wet or dirty, or as directed by your caregiver.  Wash the wound with soap and water 2 times a day. Rinse the wound off with water to remove all soap. Pat the wound dry with a clean towel.  After cleaning, apply a thin layer of the antibiotic ointment as recommended by your caregiver. This will help prevent infection and keep the dressing from sticking.  You may shower as usual after the first 24 hours. Do not soak the wound in water until the sutures are removed.  Only take over-the-counter or prescription medicines for pain, discomfort, or fever as directed by your caregiver.  Get your sutures or staples removed as directed by your caregiver. For skin adhesive strips:  Keep the wound clean and dry.  Do not get the skin adhesive strips wet. You may bathe carefully, using caution to keep the wound dry.  If the wound gets wet, pat it dry with a clean towel.  Skin adhesive strips will fall off on their own. You may trim the strips as the wound heals. Do not remove skin adhesive strips that are still stuck to the wound. They will fall off in time. For wound  adhesive:  You may briefly wet your wound in the shower or bath. Do not soak or scrub the wound. Do not swim. Avoid periods of heavy perspiration until the skin adhesive has fallen off on its own. After showering or bathing, gently pat the wound dry with a clean towel.  Do not apply liquid medicine, cream medicine, or ointment medicine to your wound while the skin adhesive is in place. This may loosen the film before your wound is healed.  If a dressing is placed over the wound, be careful not to apply tape directly over the skin adhesive. This may cause the adhesive to be pulled off before the wound is healed.  Avoid prolonged exposure to sunlight or tanning lamps while the skin adhesive is in place. Exposure to ultraviolet light in the first year will darken the scar.  The skin adhesive will usually remain in place for 5 to 10 days, then naturally fall off the skin. Do not pick at the adhesive film. You may need a  tetanus shot if:  You cannot remember when you had your last tetanus shot.  You have never had a tetanus shot. If you get a tetanus shot, your arm may swell, get red, and feel warm to the touch. This is common and not a problem. If you need a tetanus shot and you choose not to have one, there is a rare chance of getting tetanus. Sickness from tetanus can be serious. SEEK MEDICAL CARE IF:   You have redness, swelling, or increasing pain in the wound.  You see a red line that goes away from the wound.  You have yellowish-white fluid (pus) coming from the wound.  You have a fever.  You notice a bad smell coming from the wound or dressing.  Your wound breaks open before or after sutures have been removed.  You notice something coming out of the wound such as wood or glass.  Your wound is on your hand or foot and you cannot move a finger or toe. SEEK IMMEDIATE MEDICAL CARE IF:   Your pain is not controlled with prescribed medicine.  You have severe swelling around the  wound causing pain and numbness or a change in color in your arm, hand, leg, or foot.  Your wound splits open and starts bleeding.  You have worsening numbness, weakness, or loss of function of any joint around or beyond the wound.  You develop painful lumps near the wound or on the skin anywhere on your body. MAKE SURE YOU:   Understand these instructions.  Will watch your condition.  Will get help right away if you are not doing well or get worse. Document Released: 03/30/2005 Document Revised: 06/22/2011 Document Reviewed: 09/23/2010 [facility] Patient Information 2015 [place], [place]. This information is not intended to replace advice given to you by your health care provider. Make sure you discuss any questions you have with your health care provider.

## 2015-06-23 ENCOUNTER — Emergency Department (HOSPITAL_BASED_OUTPATIENT_CLINIC_OR_DEPARTMENT_OTHER)
Admission: EM | Admit: 2015-06-23 | Discharge: 2015-06-24 | Disposition: A | Discharge status: Home / Self Care | Payer: BLUE CROSS/BLUE SHIELD | Attending: Emergency Medicine | Admitting: Emergency Medicine

## 2015-06-23 ENCOUNTER — Encounter (HOSPITAL_BASED_OUTPATIENT_CLINIC_OR_DEPARTMENT_OTHER): Payer: Self-pay | Admitting: Emergency Medicine

## 2015-06-23 DIAGNOSIS — Z79899 Other long term (current) drug therapy: Secondary | ICD-10-CM | POA: Diagnosis not present

## 2015-06-23 DIAGNOSIS — Z862 Personal history of diseases of the blood and blood-forming organs and certain disorders involving the immune mechanism: Secondary | ICD-10-CM | POA: Diagnosis not present

## 2015-06-23 DIAGNOSIS — R110 Nausea: Secondary | ICD-10-CM | POA: Insufficient documentation

## 2015-06-23 DIAGNOSIS — Z7982 Long term (current) use of aspirin: Secondary | ICD-10-CM | POA: Diagnosis not present

## 2015-06-23 DIAGNOSIS — Z792 Long term (current) use of antibiotics: Secondary | ICD-10-CM | POA: Diagnosis not present

## 2015-06-23 DIAGNOSIS — F17210 Nicotine dependence, cigarettes, uncomplicated: Secondary | ICD-10-CM | POA: Diagnosis not present

## 2015-06-23 DIAGNOSIS — M549 Dorsalgia, unspecified: Secondary | ICD-10-CM | POA: Insufficient documentation

## 2015-06-23 DIAGNOSIS — L02416 Cutaneous abscess of left lower limb: Secondary | ICD-10-CM | POA: Diagnosis present

## 2015-06-23 DIAGNOSIS — Z8619 Personal history of other infectious and parasitic diseases: Secondary | ICD-10-CM | POA: Diagnosis not present

## 2015-06-23 LAB — CBC
HCT: 40.9 % (ref 39.0–52.0)
Hemoglobin: 13.6 g/dL (ref 13.0–17.0)
MCH: 28.8 pg (ref 26.0–34.0)
MCHC: 33.3 g/dL (ref 30.0–36.0)
MCV: 86.7 fL (ref 78.0–100.0)
Platelets: 165 10*3/uL (ref 150–400)
RBC: 4.72 MIL/uL (ref 4.22–5.81)
RDW: 13.4 % (ref 11.5–15.5)
WBC: 6.4 10*3/uL (ref 4.0–10.5)

## 2015-06-23 LAB — BASIC METABOLIC PANEL
Anion gap: 7 (ref 5–15)
BUN: 25 mg/dL — ABNORMAL HIGH (ref 6–20)
CO2: 24 mmol/L (ref 22–32)
Calcium: 8.4 mg/dL — ABNORMAL LOW (ref 8.9–10.3)
Chloride: 105 mmol/L (ref 101–111)
Creatinine, Ser: 0.9 mg/dL (ref 0.61–1.24)
GFR calc Af Amer: >60 mL/min (ref >60)
GFR calc non Af Amer: >60 mL/min (ref >60)
Glucose, Bld: 104 mg/dL — ABNORMAL HIGH (ref 65–99)
Potassium: 4.6 mmol/L (ref 3.5–5.1)
Sodium: 136 mmol/L (ref 135–145)

## 2015-06-23 MED ORDER — SODIUM BICARBONATE 4 % IV SOLN
5.0000 mL | Freq: Once | INTRAVENOUS | Status: AC
  Administered 2015-06-23: 5 mL via SUBCUTANEOUS
  Filled 2015-06-23: qty 5

## 2015-06-23 MED ORDER — LIDOCAINE HCL (PF) 1 % IJ SOLN
10.0000 mL | Freq: Once | INTRAMUSCULAR | Status: AC
  Administered 2015-06-23: 10 mL
  Filled 2015-06-23: qty 10

## 2015-06-23 MED ORDER — CEPHALEXIN 500 MG PO CAPS: 500.0000 mg | ORAL_CAPSULE | Freq: Four times a day (QID) | ORAL | Status: AC

## 2015-06-23 NOTE — ED Notes (Signed)
Patient states that he went to Urgent care 2 -3 days ago with a boil to his left thigh, was placed on antibiotics, the patient reports that it is not getting any better and it burns worse.

## 2015-06-23 NOTE — ED Provider Notes (Signed)
CSN: [id number]     Arrival date & time 06/23/15  1837 History   First MD Initiated Contact with Patient 06/23/15 2044     Chief Complaint  Patient presents with  . Wound Check     (Consider location/radiation/quality/duration/timing/severity/associated sxs/prior Treatment) HPI Comments: [place] Macdonald is a 45 y.o. male who presents today with a 1 week history of boil on L thigh. Patient was seen by Urgent Care on Thursday and given Bactrim and Doxycycline. Patient reports increasing pain and area of redness. No active Patient's pain was rated about a 2/10 last week and rates his pain as a 6/10 today. Patient has been febrile as recent as this morning and as high as 101.3. Patient did have flu with fever last week, but flu symptoms are mostly resolved at this time. Patient has been taking antibiotics as prescribed and Tylenol for fever. Patient has felt some nausea, but no vomiting.  Patient is a smoker, no diabetes. Patient denies chest pain, shortness of breath, abdominal pain, weakness, or numbness.     Patient is a 45 y.o. male presenting with wound check. The history is provided by the patient.  Wound Check Associated symptoms include a fever and nausea. Pertinent negatives include no abdominal pain, chest pain, chills, numbness, rash, sore throat, vomiting or weakness.    Past Medical History  Diagnosis Date  . Back pain   . Shortness of breath     feels that way all the time, ? pain or Percocet  . Thrombocytopenia (HCC) 2014  . Herpes simplex    Past Surgical History  Procedure Laterality Date  . Back surgery      lumbar fusion L3- 4, S 1  . Cervical fusion  2014  . Vasectomy     History reviewed. No pertinent family history. Social History  Substance Use Topics  . Smoking status: Current Every Day Smoker -- 2.00 packs/day for 20 years    Types: Cigarettes  . Smokeless tobacco: Never Used  . Alcohol Use: No    Review of Systems  Constitutional: Positive for fever.  Negative for chills.  HENT: Negative for facial swelling and sore throat.   Respiratory: Negative for shortness of breath.   Cardiovascular: Negative for chest pain.  Gastrointestinal: Positive for nausea. Negative for vomiting and abdominal pain.  Genitourinary: Negative for dysuria.  Musculoskeletal: Positive for back pain (Surgery 2 years ago).  Skin: Negative for rash and wound.  Neurological: Negative for weakness and numbness.  Psychiatric/Behavioral: Negative for confusion.      Allergies  Review of patient's allergies indicates no known allergies.  Home Medications   Prior to Admission medications   Medication Sig Start Date End Date Taking? Authorizing Provider  doxycycline (DORYX) 100 MG EC tablet Take 100 mg by mouth 2 (two) times daily.   Yes Historical Provider, MD  aspirin EC 325 MG tablet Take 325 mg by mouth daily.    Historical Provider, MD  cephALEXin (KEFLEX) 500 MG capsule Take 1 capsule (500 mg total) by mouth 4 (four) times daily. 06/23/15 06/30/15  Kinzley Savell M Abdallah Hern, PA-C  cyclobenzaprine (FLEXERIL) 5 MG tablet Take 5 mg by mouth 3 (three) times daily as needed for muscle spasms.    Historical Provider, MD  diazepam (VALIUM) 5 MG tablet Take 1-2 tablets (5-10 mg total) by mouth every 6 (six) hours as needed for muscle spasms. 11/09/13   Julio SicksHenry Pool, MD  ondansetron (ZOFRAN) 4 MG tablet Take 4 mg by mouth every 6 (six)  hours as needed (for nausea caused by Percocet).     Historical Provider, MD  oxyCODONE-acetaminophen (PERCOCET/ROXICET) 5-325 MG per tablet Take 1-2 tablets by mouth every 4 (four) hours as needed for moderate pain or severe pain. 11/09/13   Julio Sicks, MD  valACYclovir (VALTREX) 1000 MG tablet Take 1,000 mg by mouth every morning.    Historical Provider, MD   BP 116/88 mmHg  Pulse 77  Temp(Src) 98.9 F (37.2 C) (Oral)  Resp 18  Ht  (1.753 m)  Wt 78.926 kg  BMI 25.68 kg/m2  SpO2 96% Physical Exam  Constitutional: He appears well-developed  and well-nourished. No distress.  HENT:  Head: Normocephalic and atraumatic.  Eyes: Conjunctivae are normal. Right eye exhibits no discharge. Left eye exhibits no discharge. No scleral icterus.  Neck: Normal range of motion. Neck supple.  Cardiovascular: Normal rate, regular rhythm, normal heart sounds and intact distal pulses.  Exam reveals no gallop and no friction rub.   No murmur heard. Pulmonary/Chest: Effort normal and breath sounds normal. No respiratory distress. He has no wheezes. He has no rales.  Abdominal: Soft. He exhibits no distension. There is no tenderness. There is no rebound and no guarding.  Musculoskeletal: He exhibits no edema.  Lymphadenopathy:    He has no cervical adenopathy.  Neurological: He is alert. Coordination normal.  Skin: Skin is warm and dry. No rash noted. He is not diaphoretic. No pallor.     Psychiatric: He has a normal mood and affect.  Nursing note and vitals reviewed.   ED Course  .Marland KitchenIncision and Drainage Date/Time: 06/24/2015 1:08 AM Performed by: Emi Holes Authorized by: Emi Holes Consent: Verbal consent obtained. Risks and benefits: risks, benefits and alternatives were discussed Consent given by: patient Patient understanding: patient states understanding of the procedure being performed Patient consent: the patient's understanding of the procedure matches consent given Procedure consent: procedure consent matches procedure scheduled Test results: test results available and properly labeled Patient identity confirmed: verbally with patient Type: abscess Body area: lower extremity Location details: left leg Anesthesia: local infiltration Local anesthetic: lidocaine 1% without epinephrine and NaHCO3 (sodium bicarbonate) Patient sedated: no Scalpel size: 11 Incision type: single straight Complexity: simple Drainage: purulent and  bloody Drainage amount: moderate Wound treatment: wound left open Packing material:  none Patient tolerance: Patient tolerated the procedure well with no immediate complications Comments: Local anesthesia performed by Arthor Captain, PA-C. I&D performed under supervision of Arthor Captain, PA-C.   (including critical care time) Labs Review Labs Reviewed  BASIC METABOLIC PANEL - Abnormal; Notable for the following:    Glucose, Bld 104 (*)    BUN 25 (*)    Calcium 8.4 (*)    All other components within normal limits  CULTURE, ROUTINE-ABSCESS  CBC    Imaging Review No results found. I have personally reviewed and evaluated these images and lab results as part of my medical decision-making.   EKG Interpretation None      MDM   Patient with skin abscess. Incision and drainage performed in the ED today.  Abscess was not large enough to warrant packing or drain placement. Wound recheck in 2 days. Supportive care and return precautions discussed.  Pt sent home with Keflex and continue doxycycline prescribed at Urgent Care on Thursday. WBC not elevated. The patient appears reasonably screened and/or stabilized for discharge and I doubt any other emergent medical condition requiring further screening, evaluation, or treatment in the ED prior to discharge. Return precautions were  discussed with the patient and included in discharge instructions. Patient understands and agrees with the plan.  Discussed patient with Arthor Captain, PA-C and Dr. Karma Ganja who are in agreement with plan.     Final diagnoses:  Abscess of left leg      Emi Holes, PA-C 06/24/15 0117  Jerelyn Scott, MD 06/28/15 [phone]

## 2015-06-23 NOTE — ED Notes (Signed)
MD at bedside. 

## 2015-06-23 NOTE — Discharge Instructions (Signed)
Medications: Keflex, Doxycycline  Treatment: Continue and finish Doxycycline given at urgent care, listed above. Stop taking Bactrim. Start taking Keflex 4 times daily for 7 days. Change dressing daily. You may soak wound in warm, soapy water. You may take ibuprofen or acetaminophen for pain.  Follow-up: Please follow up with your primary care provider in 2-3 days for would recheck and healing status, or sooner if you develop any of the problems below. Please return to the emergency department or urgent care if your fever does not resolve, the redness or swelling around your wound does not decrease or continues to spread, you experience worsening pain, you see increasing amounts of drainage, you see lines of streak up or down your leg, or any other concerning symptom.  Abscess An abscess is an infected area that contains a collection of pus and debris.It can occur in almost any part of the body. An abscess is also known as a furuncle or boil. CAUSES  An abscess occurs when tissue gets infected. This can occur from blockage of oil or sweat glands, infection of hair follicles, or a minor injury to the skin. As the body tries to fight the infection, pus collects in the area and creates pressure under the skin. This pressure causes pain. People with weakened immune systems have difficulty fighting infections and get certain abscesses more often.  SYMPTOMS Usually an abscess develops on the skin and becomes a painful mass that is red, warm, and tender. If the abscess forms under the skin, you may feel a moveable soft area under the skin. Some abscesses break open (rupture) on their own, but most will continue to get worse without care. The infection can spread deeper into the body and eventually into the bloodstream, causing you to feel ill.  DIAGNOSIS  Your caregiver will take your medical history and perform a physical exam. A sample of fluid may also be taken from the abscess to determine what is causing  your infection. TREATMENT  Your caregiver may prescribe antibiotic medicines to fight the infection. However, taking antibiotics alone usually does not cure an abscess. Your caregiver may need to make a small cut (incision) in the abscess to drain the pus. In some cases, gauze is packed into the abscess to reduce pain and to continue draining the area. HOME CARE INSTRUCTIONS   Only take over-the-counter or prescription medicines for pain, discomfort, or fever as directed by your caregiver.  If you were prescribed antibiotics, take them as directed. Finish them even if you start to feel better.  If gauze is used, follow your caregiver's directions for changing the gauze.  To avoid spreading the infection:  Keep your draining abscess covered with a bandage.  Wash your hands well.  Do not share personal care items, towels, or whirlpools with others.  Avoid skin contact with others.  Keep your skin and clothes clean around the abscess.  Keep all follow-up appointments as directed by your caregiver. SEEK MEDICAL CARE IF:   You have increased pain, swelling, redness, fluid drainage, or bleeding.  You have muscle aches, chills, or a general ill feeling.  You have a fever. MAKE SURE YOU:   Understand these instructions.  Will watch your condition.  Will get help right away if you are not doing well or get worse.   This information is not intended to replace advice given to you by your health care provider. Make sure you discuss any questions you have with your health care provider.   Document  Released: 01/07/2005 Document Revised: 09/29/2011 Document Reviewed: 06/12/2011 Elsevier Interactive Patient Education [employer]

## 2015-06-26 LAB — CULTURE, ROUTINE-ABSCESS: Gram Stain: NONE SEEN

## 2015-06-27 ENCOUNTER — Telehealth: Payer: Self-pay | Admitting: *Deleted

## 2015-06-27 NOTE — ED Notes (Signed)
(+)  wound culture, treated with Sulfamethoxazole, Doxycycline and Cephalexin.  OK per Osvaldo HumanMeagan Decher, PharmD

## 2015-11-26 ENCOUNTER — Telehealth: Payer: Self-pay | Admitting: Cardiology

## 2015-11-26 NOTE — Telephone Encounter (Signed)
Received records from [facility] for appointment on 12/26/15 with Dr Antoine PocheHochrein.  [facility] Dr Hochrein's schedule on 12/26/15.

## 2015-12-11 ENCOUNTER — Encounter: Payer: Self-pay | Admitting: Cardiology

## 2015-12-25 NOTE — Progress Notes (Deleted)
Cardiology Office Note   Date:  12/25/2015   ID:  Dillon Macdonald, DOB 03/25/1971, MRN [id number]  PCP:  REDMON,NOELLE, PA-C  Cardiologist:   Rollene Rotunda, MD   No chief complaint on file.     History of Present Illness: Dillon Macdonald is a 45 y.o. male who presents for ***    Past Medical History:  Diagnosis Date  . Back pain   . Herpes simplex   . Shortness of breath    feels that way all the time, ? pain or Percocet  . Thrombocytopenia (HCC) 2014    Past Surgical History:  Procedure Laterality Date  . BACK SURGERY     lumbar fusion L3- 4, S 1  . CERVICAL FUSION  2014  . VASECTOMY       Current Outpatient Prescriptions  Medication Sig Dispense Refill  . aspirin EC 325 MG tablet Take 325 mg by mouth daily.    . cyclobenzaprine (FLEXERIL) 5 MG tablet Take 5 mg by mouth 3 (three) times daily as needed for muscle spasms.    . diazepam (VALIUM) 5 MG tablet Take 1-2 tablets (5-10 mg total) by mouth every 6 (six) hours as needed for muscle spasms. 60 tablet 0  . doxycycline (DORYX) 100 MG EC tablet Take 100 mg by mouth 2 (two) times daily.    . ondansetron (ZOFRAN) 4 MG tablet Take 4 mg by mouth every 6 (six) hours as needed (for nausea caused by Percocet).     Marland Kitchen oxyCODONE-acetaminophen (PERCOCET/ROXICET) 5-325 MG per tablet Take 1-2 tablets by mouth every 4 (four) hours as needed for moderate pain or severe pain. 90 tablet 0  . valACYclovir (VALTREX) 1000 MG tablet Take 1,000 mg by mouth every morning.     No current facility-administered medications for this visit.     Allergies:   Review of patient's allergies indicates no known allergies.    Social History:  The patient  reports that he has been smoking Cigarettes.  He has a 40.00 pack-year smoking history. He has never used smokeless tobacco. He reports that he does not drink alcohol or use drugs.   Family History:  The patient's ***family history is not on file.    ROS:  Please see the history of present  illness.   Otherwise, review of systems are positive for {NONE DEFAULTED:18576::"none"}.   All other systems are reviewed and negative.    PHYSICAL EXAM: VS:  There were no vitals taken for this visit. , BMI There is no height or weight on file to calculate BMI. GENERAL:  Well appearing HEENT:  Pupils equal round and reactive, fundi not visualized, oral mucosa unremarkable NECK:  No jugular venous distention, waveform within normal limits, carotid upstroke brisk and symmetric, no bruits, no thyromegaly LYMPHATICS:  No cervical, inguinal adenopathy LUNGS:  Clear to auscultation bilaterally BACK:  No CVA tenderness CHEST:  Unremarkable HEART:  PMI not displaced or sustained,S1 and S2 within normal limits, no S3, no S4, no clicks, no rubs, *** murmurs ABD:  Flat, positive bowel sounds normal in frequency in pitch, no bruits, no rebound, no guarding, no midline pulsatile mass, no hepatomegaly, no splenomegaly EXT:  2 plus pulses throughout, no edema, no cyanosis no clubbing SKIN:  No rashes no nodules NEURO:  Cranial nerves II through XII grossly intact, motor grossly intact throughout PSYCH:  Cognitively intact, oriented to person place and time    EKG:  EKG {ACTION; IS/IS ION:62952841} ordered today. The ekg ordered  today demonstrates ***   Recent Labs: 06/23/2015: BUN 25; Creatinine, Ser 0.90; Hemoglobin 13.6; Platelets 165; Potassium 4.6; Sodium 136    Lipid Panel No results found for: CHOL, TRIG, HDL, CHOLHDL, VLDL, LDLCALC, LDLDIRECT    Wt Readings from Last 3 Encounters:  06/23/15 174 lb (78.9 kg)  12/23/14 188 lb (85.3 kg)  11/06/13 190 lb (86.2 kg)      Other studies Reviewed: Additional studies/ records that were reviewed today include: ***. Review of the above records demonstrates:  Please see elsewhere in the note.  ***   ASSESSMENT AND PLAN:  ***   Current medicines are reviewed at length with the patient today.  The patient {ACTIONS; HAS/DOES NOT HAVE:19233}  concerns regarding medicines.  The following changes have been made:  {PLAN; NO CHANGE:13088:s}  Labs/ tests ordered today include: *** No orders of the defined types were placed in this encounter.    Disposition:   FU with ***    Signed, Rollene RotundaJames Destiny Hagin, MD  12/25/2015 9:44 PM    [facility]

## 2015-12-26 ENCOUNTER — Ambulatory Visit: Payer: BLUE CROSS/BLUE SHIELD | Admitting: Cardiology

## 2015-12-30 ENCOUNTER — Other Ambulatory Visit (INDEPENDENT_AMBULATORY_CARE_PROVIDER_SITE_OTHER): Payer: BLUE CROSS/BLUE SHIELD

## 2015-12-30 ENCOUNTER — Encounter: Payer: Self-pay | Admitting: Endocrinology

## 2015-12-30 ENCOUNTER — Ambulatory Visit (INDEPENDENT_AMBULATORY_CARE_PROVIDER_SITE_OTHER): Payer: BLUE CROSS/BLUE SHIELD | Admitting: Endocrinology

## 2015-12-30 DIAGNOSIS — E291 Testicular hypofunction: Secondary | ICD-10-CM

## 2015-12-30 LAB — FOLLICLE STIMULATING HORMONE: FSH: 3 m[IU]/mL (ref 1.4–18.1)

## 2015-12-30 LAB — IBC PANEL
Iron: 40 ug/dL — ABNORMAL LOW (ref 42–165)
Saturation Ratios: 13.5 % — ABNORMAL LOW (ref 20.0–50.0)
Transferrin: 211 mg/dL — ABNORMAL LOW (ref 212.0–360.0)

## 2015-12-30 LAB — LUTEINIZING HORMONE: LH: 2.57 m[IU]/mL (ref 1.50–9.30)

## 2015-12-30 NOTE — Patient Instructions (Signed)
blood tests are requested for you today.  We'll let you know about the results. Based on the results, I hope to be able to prescribe for you a pill to improve the testosterone. Testosterone treatment has risks, including increased or decreased fertility (depending on the type of treatment), hair loss, prostate cancer, benign prostate enlargement, blood clots, liver problems, lower hdl ("good cholesterol"), polycythemia (opposite of anemia), sleep apnea, and behavior changes.   

## 2015-12-30 NOTE — Progress Notes (Addendum)
Subjective:    Patient ID: Dillon Macdonald, male    DOB: 24-Jul-1970, 45 y.o.   MRN: [id number]  HPI Pt I referred by Milus Height, for hypogonadism.  He reports he had puberty at the normal age.  He has 1 biological child.  He says he has never taken illicit androgens.  He has never been on any prescribed medication for hypogonadism.  He denies any h/o infertility, XRT, or genital infection.  He has never had surgery, or a serious injury to the head or genital area.  He has slight numbness of the arms, and assoc fatigue.  He has had a vasectomy.  Past Medical History:  Diagnosis Date  . Back pain   . Herpes simplex   . Hypogonadism male   . Shortness of breath    feels that way all the time, ? pain or Percocet  . Thrombocytopenia (HCC) 2014    Past Surgical History:  Procedure Laterality Date  . BACK SURGERY     lumbar fusion L3- 4, S 1  . CERVICAL FUSION  2014  . VASECTOMY      Social History   Social History  . Marital status: Divorced    Spouse name: N/A  . Number of children: N/A  . Years of education: N/A   Occupational History  . Not on file.   Social History Main Topics  . Smoking status: Current Every Day Smoker    Packs/day: 2.00    Years: 20.00    Types: Cigarettes  . Smokeless tobacco: Never Used  . Alcohol use No  . Drug use: No  . Sexual activity: Not on file   Other Topics Concern  . Not on file   Social History Narrative  . No narrative on file    Current Outpatient Prescriptions on File Prior to Visit  Medication Sig Dispense Refill  . aspirin EC 325 MG tablet Take 325 mg by mouth daily.     No current facility-administered medications on file prior to visit.     No Known Allergies  Family History  Problem Relation Age of Onset  . Other Neg Hx     low testosterone    BP 118/72   Pulse 81   Ht 5\' 9"  (1.753 m)   Wt 171 lb (77.6 kg)   SpO2 94%   BMI 25.25 kg/m    Review of Systems denies headache, fever, diarrhea,  palpitations, anxiety, rash, visual loss, abdominal pain, sob, urinary frequency, gynecomastia, cramps, excessive diaphoresis, cold intolerance, rhinorrhea, easy bruising, seizure, or LOC.  He has lost weight since spinal surgeries.  He has ED symptoms.     Objective:   Physical Exam VS: see vs page GEN: no distress HEAD: head: no deformity eyes: no periorbital swelling, no proptosis external nose and ears are normal mouth: no lesion seen NECK: supple, thyroid is not enlarged CHEST WALL: no deformity LUNGS: clear to auscultation BREASTS:  No gynecomastia.  CV: reg rate and rhythm, no murmur.   ABD: abdomen is soft, nontender.  no hepatosplenomegaly.  not distended.  no hernia GENITALIA:  Normal male.   MUSCULOSKELETAL: muscle bulk and strength are grossly normal.  no obvious joint swelling.  gait is normal and steady EXTEMITIES: no deformity.  no edema.  PULSES: no carotid bruit NEURO:  cn 2-12 grossly intact.   readily moves all 4's.  sensation is intact to touch on all 4's.   SKIN:  Normal texture and temperature.  No rash or suspicious  lesion is visible.  Normal hair distribution.  NODES:  None palpable at the neck PSYCH: alert, well-oriented.  Does not appear anxious nor depressed.    Head CT (2010): no mention is made of the pituitary.   I have reviewed outside records, and summarized: Pt was noted to have h/o narcotic abuse and chronic back and neck pain  Testosterone=147 TSH=1.1     Assessment & Plan:  Hypogonadism, central, new.  I have sent a prescription to your pharmacy, for clomid.  Recheck testosterone in 1 month. Opiate abuse. This is the most likely cause for the hypogonadism

## 2015-12-31 LAB — TESTOSTERONE, FREE, TOTAL, SHBG
Sex Hormone Binding: 49.2 nmol/L (ref 16.5–55.9)
Testosterone, Free: 4.3 pg/mL — ABNORMAL LOW (ref 6.8–21.5)
Testosterone: 282 ng/dL (ref 264–916)

## 2015-12-31 LAB — PROLACTIN: Prolactin: 11.1 ng/mL (ref 2.0–18.0)

## 2015-12-31 MED ORDER — CLOMIPHENE CITRATE 50 MG PO TABS: ORAL_TABLET | ORAL | 5 refills | Status: DC

## 2016-01-01 ENCOUNTER — Telehealth: Payer: Self-pay | Admitting: Endocrinology

## 2016-01-01 MED ORDER — CLOMIPHENE CITRATE 50 MG PO TABS: ORAL_TABLET | ORAL | 5 refills | Status: DC

## 2016-01-01 NOTE — Telephone Encounter (Signed)
[facility] is stating they have not received the clomid rx

## 2016-01-01 NOTE — Telephone Encounter (Signed)
Refill submitted. 

## 2016-01-02 ENCOUNTER — Encounter: Payer: Self-pay | Admitting: Cardiovascular Disease

## 2016-01-17 ENCOUNTER — Encounter: Payer: Self-pay | Admitting: Cardiovascular Disease

## 2016-01-17 ENCOUNTER — Ambulatory Visit (INDEPENDENT_AMBULATORY_CARE_PROVIDER_SITE_OTHER): Payer: BLUE CROSS/BLUE SHIELD | Admitting: Cardiovascular Disease

## 2016-01-17 VITALS — BP 116/74 | HR 81

## 2016-01-17 DIAGNOSIS — R0789 Other chest pain: Secondary | ICD-10-CM

## 2016-01-17 DIAGNOSIS — R079 Chest pain, unspecified: Secondary | ICD-10-CM

## 2016-01-17 DIAGNOSIS — Z8249 Family history of ischemic heart disease and other diseases of the circulatory system: Secondary | ICD-10-CM

## 2016-01-17 DIAGNOSIS — R5383 Other fatigue: Secondary | ICD-10-CM | POA: Diagnosis not present

## 2016-01-17 HISTORY — DX: Other chest pain: R07.89

## 2016-01-17 LAB — COMPREHENSIVE METABOLIC PANEL
ALT: 9 U/L (ref 9–46)
AST: 14 U/L (ref 10–40)
Albumin: 4 g/dL (ref 3.6–5.1)
Alkaline Phosphatase: 67 U/L (ref 40–115)
BUN: 16 mg/dL (ref 7–25)
CO2: 26 mmol/L (ref 20–31)
Calcium: 8.8 mg/dL (ref 8.6–10.3)
Chloride: 103 mmol/L (ref 98–110)
Creat: 1.02 mg/dL (ref 0.60–1.35)
Glucose, Bld: 85 mg/dL (ref 65–99)
Potassium: 4.7 mmol/L (ref 3.5–5.3)
Sodium: 137 mmol/L (ref 135–146)
Total Bilirubin: 0.6 mg/dL (ref 0.2–1.2)
Total Protein: 6.4 g/dL (ref 6.1–8.1)

## 2016-01-17 LAB — LIPID PANEL
Cholesterol: 161 mg/dL (ref 125–200)
HDL: 36 mg/dL — ABNORMAL LOW (ref >=40)
LDL Cholesterol: 110 mg/dL (ref <130)
Total CHOL/HDL Ratio: 4.5 Ratio (ref <=5.0)
Triglycerides: 73 mg/dL (ref <150)
VLDL: 15 mg/dL (ref <30)

## 2016-01-17 LAB — TSH: TSH: 1.53 mIU/L (ref 0.40–4.50)

## 2016-01-17 LAB — T4, FREE: Free T4: 1.1 ng/dL (ref 0.8–1.8)

## 2016-01-17 MED ORDER — BUPROPION HCL ER (SR) 150 MG PO TB12: 150.0000 mg | ORAL_TABLET | Freq: Two times a day (BID) | ORAL | 2 refills | Status: DC

## 2016-01-17 MED ORDER — CEPHALEXIN 500 MG PO CAPS: 500.0000 mg | ORAL_CAPSULE | Freq: Two times a day (BID) | ORAL | 0 refills | Status: AC

## 2016-01-17 NOTE — Patient Instructions (Addendum)
Medication Instructions:  Your physician has recommended you make the following change in your medication:  1. START Wellbutrin 150mg  take one tablet by mouth daily for 3 days and then increase to one tablet by mouth twice a day for 12 WEEKS 2. START Keflex 500mg  take one tablet by mouth twice a day for 7 days  Labwork: Your physician recommends that you have lab work today: LIPID, CMP, TSH and Free T4  Testing/Procedures: Your physician has requested that you have cardiac CT. Cardiac computed tomography (CT) is a painless test that uses an x-ray machine to take clear, detailed pictures of your heart. For further information please visit [email or web address].   Nothing to eat or drink for 4 hours prior to test. Okay to take your medications.   Follow-Up: Your physician recommends that you schedule a follow-up appointment in: 1 MONTH with Dr Duke Salviaandolph   Any Other Special Instructions Will Be Listed Below (If Applicable).     If you need a refill on your cardiac medications before your next appointment, please call your pharmacy.

## 2016-01-17 NOTE — Progress Notes (Signed)
Cardiology Office Note   Date:  01/17/2016   ID:  Dillon Macdonald, DOB 09/27/1970, MRN [id number]  PCP:  Macdonald,NOELLE, PA-C  Cardiologist:   Chilton Siiffany [place], MD   Chief Complaint  Patient presents with  . New Patient (Initial Visit)    fm hx of cardiac arrest      History of Present Illness: Dillon PellegriniJohn D Macdonald is a 45 y.o. male spinal stenosis who presents for evaluation of chest pain.  Mr. Dillon Macdonald reports Intermittent episodes of chest pain. For the last for 5 months he's had chest pain almost daily. The chest pain is substernal and occurs while walking around at work and improves with rest.. He walks up to 12,000 steps daily. He continues to have the chest pain He also notes pain in the center of his left arm.  He denies shortness of breath, nausea, or diaphoresis.  He also has not noted any lower extremity edema, orthopnea, or PND.  He saw Dillon HeightNoelle Redmon, PA on 11/19/15 and reported these symptoms so he was referred to cardiology for further evaluation.  He reportedly had a normal stress test in 2014.   Mr. Dillon Macdonald does have an extensive history of coronary artery disease. He has 3 older brothers B Deno EtienneChu had heart attacks. One of his brothers had coronary stents placed at age 43. The other had a cardiac arrest while at a soccer game. He has several paternal uncles with coronary disease. His father reportedly had a stroke at age 43.  He reports having a healthy diet.  He continues to smoke 1 ppd x 20 years.    He has a spider bite on his right wrist that is painful and swollen.  Past Medical History:  Diagnosis Date  . Atypical chest pain 01/17/2016  . Back pain   . Herpes simplex   . Hypogonadism male   . Shortness of breath    feels that way all the time, ? pain or Percocet  . Thrombocytopenia (HCC) 2014    Past Surgical History:  Procedure Laterality Date  . BACK SURGERY     lumbar fusion L3- 4, S 1  . CERVICAL FUSION  2014  . VASECTOMY       Current Outpatient Prescriptions    Medication Sig Dispense Refill  . aspirin EC 325 MG tablet Take 325 mg by mouth daily.    . buprenorphine-naloxone (SUBOXONE) 8-2 MG SUBL SL tablet Place 1 tablet under the tongue daily.    . clomiPHENE (CLOMID) 50 MG tablet 1/4 tab, 3 times a week 5 tablet 5  . buPROPion (WELLBUTRIN SR) 150 MG 12 hr tablet Take 1 tablet (150 mg total) by mouth 2 (two) times daily. 60 tablet 2  . cephALEXin (KEFLEX) 500 MG capsule Take 1 capsule (500 mg total) by mouth 2 (two) times daily. 14 capsule 0   No current facility-administered medications for this visit.     Allergies:   Review of patient's allergies indicates no known allergies.    Social History:  The patient  reports that he has been smoking Cigarettes.  He has a 40.00 pack-year smoking history. He has never used smokeless tobacco. He reports that he does not drink alcohol or use drugs.   Family History:  The patient's family history includes Heart attack in his brother, brother, and paternal uncle; Hypertension in his brother; Hyperthyroidism in his mother; Stroke in his father.    ROS:  Please see the history of present illness.   Otherwise, review  of systems are positive for none.   All other systems are reviewed and negative.    PHYSICAL EXAM: VS:  BP 116/74   Pulse 81  , BMI There is no height or weight on file to calculate BMI. GENERAL:  Well appearing HEENT:  Pupils equal round and reactive, fundi not visualized, oral mucosa unremarkable NECK:  No jugular venous distention, waveform within normal limits, carotid upstroke brisk and symmetric, no bruits, no thyromegaly LYMPHATICS:  No cervical adenopathy LUNGS:  Clear to auscultation bilaterally HEART:  RRR.  PMI not displaced or sustained,S1 and S2 within normal limits, no S3, no S4, no clicks, no rubs, no murmurs ABD:  Flat, positive bowel sounds normal in frequency in pitch, no bruits, no rebound, no guarding, no midline pulsatile mass, no hepatomegaly, no splenomegaly EXT:  2  plus pulses throughout, no edema, no cyanosis no clubbing.  R forearm with 1 inc area of erythema and induration.  Black eschar 0.5cm in center SKIN:  No rashes no nodules NEURO:  Cranial nerves II through XII grossly intact, motor grossly intact throughout PSYCH:  Cognitively intact, oriented to person place and time   EKG:  EKG is ordered today. The ekg ordered today demonstrates sinus rhythm rate 81 bpm.    Recent Labs: 06/23/2015: BUN 25; Creatinine, Ser 0.90; Hemoglobin 13.6; Platelets 165; Potassium 4.6; Sodium 136   11/19/15:  WBC 7.5, hemoglobin 13.8, hematocrit 41.7, platelets 157 Sodium 138, potassium 4.3, BUN 17, creatinine 0.91  Lipid Panel No results found for: CHOL, TRIG, HDL, CHOLHDL, VLDL, LDLCALC, LDLDIRECT    Wt Readings from Last 3 Encounters:  12/30/15 171 lb (77.6 kg)  06/23/15 174 lb (78.9 kg)  12/23/14 188 lb (85.3 kg)      ASSESSMENT AND PLAN:  # Chest pain: # Family history of CAD: Mr. Artman chest pain is atypical.  However, he does have risk factors and his a family history of premature CAD.  We will obtain a cardiac CT angiogram to assess both his current symptoms as well as risk stratify for the need for therapy.  Continue aspirin for now.  We will check fasting lipids and a conference the metabolic panel.   # Tobacco abuse: We will start Wellbutrin 150 mg daily x 3 days followed by 150mg  for 12 weeks.  We spent 5 minutes on cessation.  # Spider bite: Keflex 500mg  bid x7 days.  Patient was instructed to draw a ring around the erythema.  Follow up with PCP if erythema continues to swell or if not improved by the end of therapy.   # Fatigue: Mr. Odenthal reports fatigue: Check TSH and free T4.   Current medicines are reviewed at length with the patient today.  The patient does not have concerns regarding medicines.  The following changes have been made:  no change  Labs/ tests ordered today include:   Orders Placed This Encounter  Procedures  .  CT Heart Morp W/Cta Cor W/Score W/Ca W/Cm &/Or Wo/Cm  . Lipid panel  . Comprehensive Metabolic Panel (CMET)  . TSH  . T4, free  . EKG 12-Lead     Disposition:   FU with Baelynn Schmuhl C. Duke Salvia, MD, [facility] in 1 month.    This note was written with the assistance of speech recognition software.  Please excuse any transcriptional errors.  Signed, Autymn Omlor C. Duke Salvia, MD, [facility]  01/17/2016 1:28 PM    [facility]

## 2016-01-20 ENCOUNTER — Encounter: Payer: Self-pay | Admitting: Cardiovascular Disease

## 2016-01-28 ENCOUNTER — Ambulatory Visit (HOSPITAL_COMMUNITY)
Admission: RE | Admit: 2016-01-28 | Discharge: 2016-01-28 | Disposition: A | Discharge status: Home / Self Care | Payer: BLUE CROSS/BLUE SHIELD | Source: Ambulatory Visit | Attending: Cardiovascular Disease | Admitting: Cardiovascular Disease

## 2016-01-28 ENCOUNTER — Encounter (HOSPITAL_COMMUNITY): Payer: Self-pay

## 2016-01-28 DIAGNOSIS — Z8249 Family history of ischemic heart disease and other diseases of the circulatory system: Secondary | ICD-10-CM

## 2016-01-28 DIAGNOSIS — R079 Chest pain, unspecified: Secondary | ICD-10-CM

## 2016-02-06 ENCOUNTER — Other Ambulatory Visit: Payer: Self-pay | Admitting: *Deleted

## 2016-02-06 ENCOUNTER — Other Ambulatory Visit: Payer: Self-pay

## 2016-02-06 MED ORDER — BUPROPION HCL ER (SR) 150 MG PO TB12: 150.0000 mg | ORAL_TABLET | Freq: Two times a day (BID) | ORAL | 8 refills | Status: DC

## 2016-02-25 ENCOUNTER — Encounter: Payer: Self-pay | Admitting: Cardiovascular Disease

## 2016-02-25 ENCOUNTER — Ambulatory Visit (INDEPENDENT_AMBULATORY_CARE_PROVIDER_SITE_OTHER): Payer: BLUE CROSS/BLUE SHIELD | Admitting: Cardiovascular Disease

## 2016-02-25 VITALS — BP 137/80 | HR 88 | Ht 69.0 in | Wt 173.2 lb

## 2016-02-25 DIAGNOSIS — R0789 Other chest pain: Secondary | ICD-10-CM

## 2016-02-25 DIAGNOSIS — Z720 Tobacco use: Secondary | ICD-10-CM | POA: Diagnosis not present

## 2016-02-25 DIAGNOSIS — R5382 Chronic fatigue, unspecified: Secondary | ICD-10-CM

## 2016-02-25 NOTE — Progress Notes (Signed)
Cardiology Office Note   Date:  02/25/2016   ID:  Dillon PellegriniJohn D Privett, DOB 10/06/1970, MRN [id number]  PCP:  REDMON,NOELLE, PA-C  Cardiologist:   Chilton Siiffany [place], MD   Chief Complaint  Patient presents with  . Follow-up    1 month  . Shortness of Breath    occassionally.  . Chest Pain      History of Present Illness: Dillon Macdonald is a 45 y.o. male spinal stenosis who presents for follow up on chest pain.  He was seen 01/17/16 with intermittent episodes of chest pain that is worse when walking.  He was referred for a cardiac CT-A but has not yet been able to get it.  He was sick on the scheduled day.  Dillon Macdonald continues to report daily chest pain.  He also feels tired and drained most days.  He notes that he sleeps 3 hours most nights but has always been this way.  He has low testosterone and hasn't responded to oral supplementation so he will start injections soon.  He denies shortness of breath, palpitations, lower extremity edema, orthopnea or PND.  He reportedly had a normal stress test in 2014. He has an extensive family history of CAD, including three older brothers.  One had PCI at age 43. He continues to smoke but has been trying to cut back.  However, he has been using chewing tobacco instead.  He has been using Wellbutrin but doesn't think that it helps.   Past Medical History:  Diagnosis Date  . Atypical chest pain 01/17/2016  . Back pain   . Herpes simplex   . Hypogonadism male   . Shortness of breath    feels that way all the time, ? pain or Percocet  . Thrombocytopenia (HCC) 2014    Past Surgical History:  Procedure Laterality Date  . BACK SURGERY     lumbar fusion L3- 4, S 1  . CERVICAL FUSION  2014  . VASECTOMY       Current Outpatient Prescriptions  Medication Sig Dispense Refill  . aspirin EC 325 MG tablet Take 325 mg by mouth daily.    . buprenorphine-naloxone (SUBOXONE) 8-2 MG SUBL SL tablet Place 1 tablet under the tongue daily.    Marland Kitchen. buPROPion  (WELLBUTRIN SR) 150 MG 12 hr tablet Take 1 tablet (150 mg total) by mouth 2 (two) times daily. 60 tablet 8  . clomiPHENE (CLOMID) 50 MG tablet 1/4 tab, 3 times a week 5 tablet 5   No current facility-administered medications for this visit.     Allergies:   Patient has no known allergies.    Social History:  The patient  reports that he has been smoking Cigarettes.  He has a 40.00 pack-year smoking history. He has never used smokeless tobacco. He reports that he does not drink alcohol or use drugs.   Family History:  The patient's family history includes Heart attack in his brother, brother, and paternal uncle; Hypertension in his brother; Hyperthyroidism in his mother; Stroke in his father.    ROS:  Please see the history of present illness.   Otherwise, review of systems are positive for none.   All other systems are reviewed and negative.    PHYSICAL EXAM: VS:  BP 137/80   Pulse 88   Ht 5\' 9"  (1.753 m)   Wt 78.6 kg (173 lb 3.2 oz)   BMI 25.58 kg/m  , BMI Body mass index is 25.58 kg/m. GENERAL:  Well appearing  HEENT:  Pupils equal round and reactive, fundi not visualized, oral mucosa unremarkable NECK:  No jugular venous distention, waveform within normal limits, carotid upstroke brisk and symmetric, no bruits LYMPHATICS:  No cervical adenopathy LUNGS:  Clear to auscultation bilaterally HEART:  RRR.  PMI not displaced or sustained,S1 and S2 within normal limits, no S3, no S4, no clicks, no rubs, no murmurs ABD:  Flat, positive bowel sounds normal in frequency in pitch, no bruits, no rebound, no guarding, no midline pulsatile mass, no hepatomegaly, no splenomegaly EXT:  2 plus pulses throughout, no edema, no cyanosis no clubbing.  R forearm with 1 inc area of erythema and induration.  Black eschar 0.5cm in center SKIN:  No rashes no nodules NEURO:  Cranial nerves II through XII grossly intact, motor grossly intact throughout PSYCH:  Cognitively intact, oriented to person place and  time   EKG:  EKG is ordered today. The ekg ordered today demonstrates sinus rhythm rate 81 bpm.    Recent Labs: 06/23/2015: Hemoglobin 13.6; Platelets 165 01/17/2016: ALT 9; BUN 16; Creat 1.02; Potassium 4.7; Sodium 137; TSH 1.53   11/19/15:  WBC 7.5, hemoglobin 13.8, hematocrit 41.7, platelets 157 Sodium 138, potassium 4.3, BUN 17, creatinine 0.91  Lipid Panel    Component Value Date/Time   CHOL 161 01/17/2016 1051   TRIG 73 01/17/2016 1051   HDL 36 (L) 01/17/2016 1051   CHOLHDL 4.5 01/17/2016 1051   VLDL 15 01/17/2016 1051   LDLCALC 110 01/17/2016 1051      Wt Readings from Last 3 Encounters:  02/25/16 78.6 kg (173 lb 3.2 oz)  12/30/15 77.6 kg (171 lb)  06/23/15 78.9 kg (174 lb)      ASSESSMENT AND PLAN:  # Chest pain: # Family history of CAD: Dillon Macdonald's chest pain is atypical.  However, he does have risk factors and his a family history of premature CAD.  He was referred for a cardiac CT angiogram but hasn't had it yet.  He will reschedule today. Lipids were pretty good.  We will determine the need for a statin based on these results.  # Tobacco abuse: Continue Wellbutrin.  Recommended that he add nicotine patches.  We discussed cessation for 5 minutes.  # Fatigue: Dillon Macdonald continues to report fatigue.  Thyroid function is normal. He reports sleeping only 3 hours per night which is likely contributing.  He states that he has done this his whole life and doesn't think that is the problem.  He also has low testosterone levels and his going to start injections soon.   Current medicines are reviewed at length with the patient today.  The patient does not have concerns regarding medicines.  The following changes have been made:  no change  Labs/ tests ordered today include:   No orders of the defined types were placed in this encounter.    Disposition:   FU with Roneisha Stern C. Duke Salviaandolph, MD, [facility] in 1 month.    This note was written with the assistance of speech  recognition software.  Please excuse any transcriptional errors.  Signed, Ilona Colley C. Duke Salviaandolph, MD, [facility]  02/25/2016 4:11 PM    [facility]

## 2016-02-25 NOTE — Patient Instructions (Signed)
Medication Instructions:  Your physician recommends that you continue on your current medications as directed. Please refer to the Current Medication list given to you today.  Labwork: BMET AT [facility]   Testing/Procedures: GET CT RESCHEDULED  Follow-Up: Your physician recommends that you schedule a follow-up appointment in: 1 MONTH OV  If you need a refill on your cardiac medications before your next appointment, please call your pharmacy.

## 2016-02-26 ENCOUNTER — Ambulatory Visit (INDEPENDENT_AMBULATORY_CARE_PROVIDER_SITE_OTHER): Payer: BLUE CROSS/BLUE SHIELD | Admitting: Endocrinology

## 2016-02-26 ENCOUNTER — Encounter: Payer: Self-pay | Admitting: Endocrinology

## 2016-02-26 VITALS — BP 132/80 | HR 100 | Ht 69.0 in | Wt 172.0 lb

## 2016-02-26 DIAGNOSIS — E291 Testicular hypofunction: Secondary | ICD-10-CM | POA: Diagnosis not present

## 2016-02-26 LAB — CBC WITH DIFFERENTIAL/PLATELET
Basophils Absolute: 0 10*3/uL (ref 0.0–0.1)
Basophils Relative: 0.6 % (ref 0.0–3.0)
Eosinophils Absolute: 0.1 10*3/uL (ref 0.0–0.7)
Eosinophils Relative: 2.1 % (ref 0.0–5.0)
HCT: 44.2 % (ref 39.0–52.0)
Hemoglobin: 14.6 g/dL (ref 13.0–17.0)
Lymphocytes Relative: 25.8 % (ref 12.0–46.0)
Lymphs Abs: 1.8 10*3/uL (ref 0.7–4.0)
MCHC: 32.9 g/dL (ref 30.0–36.0)
MCV: 86.1 fl (ref 78.0–100.0)
Monocytes Absolute: 0.3 10*3/uL (ref 0.1–1.0)
Monocytes Relative: 4.7 % (ref 3.0–12.0)
Neutro Abs: 4.8 10*3/uL (ref 1.4–7.7)
Neutrophils Relative %: 66.8 % (ref 43.0–77.0)
Platelets: 197 10*3/uL (ref 150.0–400.0)
RBC: 5.13 Mil/uL (ref 4.22–5.81)
RDW: 14.8 % (ref 11.5–15.5)
WBC: 7.1 10*3/uL (ref 4.0–10.5)

## 2016-02-26 NOTE — Patient Instructions (Addendum)
A blood test is requested for you today.  We'll let you know about the results. If the testosterone is normal, you should conclude that the testosterone level is not the cause of your symptoms.  Testosterone treatment has risks, including increased or decreased fertility (depending on the type of treatment), hair loss, prostate cancer, benign prostate enlargement, blood clots, liver problems, lower hdl ("good cholesterol"), polycythemia (opposite of anemia), sleep apnea, and behavior changes. Please return in 1 year.

## 2016-02-26 NOTE — Progress Notes (Signed)
Subjective:    Patient ID: Dillon PellegriniJohn D Beg, male    DOB: 12/24/1970, 45 y.o.   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  Since on the clomid, he feels no better.  In particular, fatigue and decreased libido persist.  Past Medical History:  Diagnosis Date  . Atypical chest pain 01/17/2016  . Back pain   . Herpes simplex   . Hypogonadism male   . Shortness of breath    feels that way all the time, ? pain or Percocet  . Thrombocytopenia (HCC) 2014    Past Surgical History:  Procedure Laterality Date  . BACK SURGERY     lumbar fusion L3- 4, S 1  . CERVICAL FUSION  2014  . VASECTOMY      Social History   Social History  . Marital status: Divorced    Spouse name: N/A  . Number of children: N/A  . Years of education: N/A   Occupational History  . Not on file.   Social History Main Topics  . Smoking status: Current Every Day Smoker    Packs/day: 2.00    Years: 20.00    Types: Cigarettes  . Smokeless tobacco: Never Used  . Alcohol use No  . Drug use: No  . Sexual activity: Not on file   Other Topics Concern  . Not on file   Social History Narrative  . No narrative on file    Current Outpatient Prescriptions on File Prior to Visit  Medication Sig Dispense Refill  . aspirin EC 325 MG tablet Take 325 mg by mouth daily.    . buprenorphine-naloxone (SUBOXONE) 8-2 MG SUBL SL tablet Place 1 tablet under the tongue daily.    Marland Kitchen. buPROPion (WELLBUTRIN SR) 150 MG 12 hr tablet Take 1 tablet (150 mg total) by mouth 2 (two) times daily. 60 tablet 8  . clomiPHENE (CLOMID) 50 MG tablet 1/4 tab, 3 times a week (Patient not taking: Reported on 02/26/2016) 5 tablet 5   No current facility-administered medications on file prior to visit.     No Known Allergies  Family History  Problem Relation Age of Onset  .  Hyperthyroidism Mother   . Stroke Father   . Heart attack Brother   . Heart attack Paternal Uncle   . Heart attack Brother   . Hypertension Brother   . Other Neg Hx     low testosterone    BP 132/80   Pulse 100   Ht 5\' 9"  (1.753 m)   Wt 172 lb (78 kg)   SpO2 96%   BMI 25.40 kg/m   Review of Systems Denies edema.     Objective:   Physical Exam VITAL SIGNS:  See vs page.  GENERAL: no distress.  Ext: no edema.        Assessment & Plan:  Hypogonadism, due for recheck Patient is advised the following: Patient Instructions  A blood test is requested for you today.  We'll let you know about the results. If the testosterone is normal, you should conclude that the testosterone level is not the cause of your symptoms.  Testosterone treatment has risks, including increased or decreased fertility (depending on the type of treatment), hair loss, prostate cancer, benign prostate enlargement, blood clots, liver problems, lower hdl ("good cholesterol"), polycythemia (opposite of anemia), sleep apnea, and  behavior changes. Please return in 1 year.

## 2016-02-28 LAB — TESTOSTERONE,FREE AND TOTAL
Testosterone, Free: 6.4 pg/mL — ABNORMAL LOW (ref 6.8–21.5)
Testosterone: 229 ng/dL — ABNORMAL LOW (ref 264–916)

## 2016-03-16 ENCOUNTER — Ambulatory Visit (HOSPITAL_COMMUNITY): Admission: RE | Admit: 2016-03-16 | Payer: BLUE CROSS/BLUE SHIELD | Source: Ambulatory Visit

## 2016-03-16 ENCOUNTER — Encounter: Payer: Self-pay | Admitting: Cardiovascular Disease

## 2016-03-23 ENCOUNTER — Other Ambulatory Visit: Payer: Self-pay | Admitting: Cardiovascular Disease

## 2016-03-26 ENCOUNTER — Ambulatory Visit (HOSPITAL_COMMUNITY)
Admission: RE | Admit: 2016-03-26 | Discharge: 2016-03-26 | Disposition: A | Discharge status: Home / Self Care | Payer: BLUE CROSS/BLUE SHIELD | Source: Ambulatory Visit | Attending: Cardiovascular Disease | Admitting: Cardiovascular Disease

## 2016-03-26 ENCOUNTER — Encounter (HOSPITAL_COMMUNITY): Payer: Self-pay

## 2016-03-26 DIAGNOSIS — R079 Chest pain, unspecified: Secondary | ICD-10-CM | POA: Diagnosis not present

## 2016-03-26 MED ORDER — IOPAMIDOL (ISOVUE-370) INJECTION 76%
INTRAVENOUS | Status: AC
  Administered 2016-03-26: 80 mL
  Filled 2016-03-26: qty 100

## 2016-03-26 MED ORDER — METOPROLOL TARTRATE 5 MG/5ML IV SOLN: 5.0000 mg | INTRAVENOUS | Status: DC | PRN

## 2016-03-26 MED ORDER — NITROGLYCERIN 0.4 MG SL SUBL: 0.8000 mg | SUBLINGUAL_TABLET | Freq: Once | SUBLINGUAL | Status: DC

## 2016-03-26 MED ORDER — NITROGLYCERIN 0.4 MG SL SUBL
SUBLINGUAL_TABLET | SUBLINGUAL | Status: AC
  Filled 2016-03-26: qty 2

## 2016-03-26 MED ORDER — METOPROLOL TARTRATE 5 MG/5ML IV SOLN
INTRAVENOUS | Status: AC
  Filled 2016-03-26: qty 5

## 2016-04-08 ENCOUNTER — Ambulatory Visit: Payer: BLUE CROSS/BLUE SHIELD | Admitting: Cardiovascular Disease

## 2016-05-05 IMAGING — CR DG CHEST 2V
2 series · 2 of 2 positions shown · non-contrast
Comparison: PA and lateral chest x-ray July 18, 2008

CLINICAL DATA: Shortness of breath with history of tobacco use

EXAM:
CHEST  2 VIEW

[w chest pa]
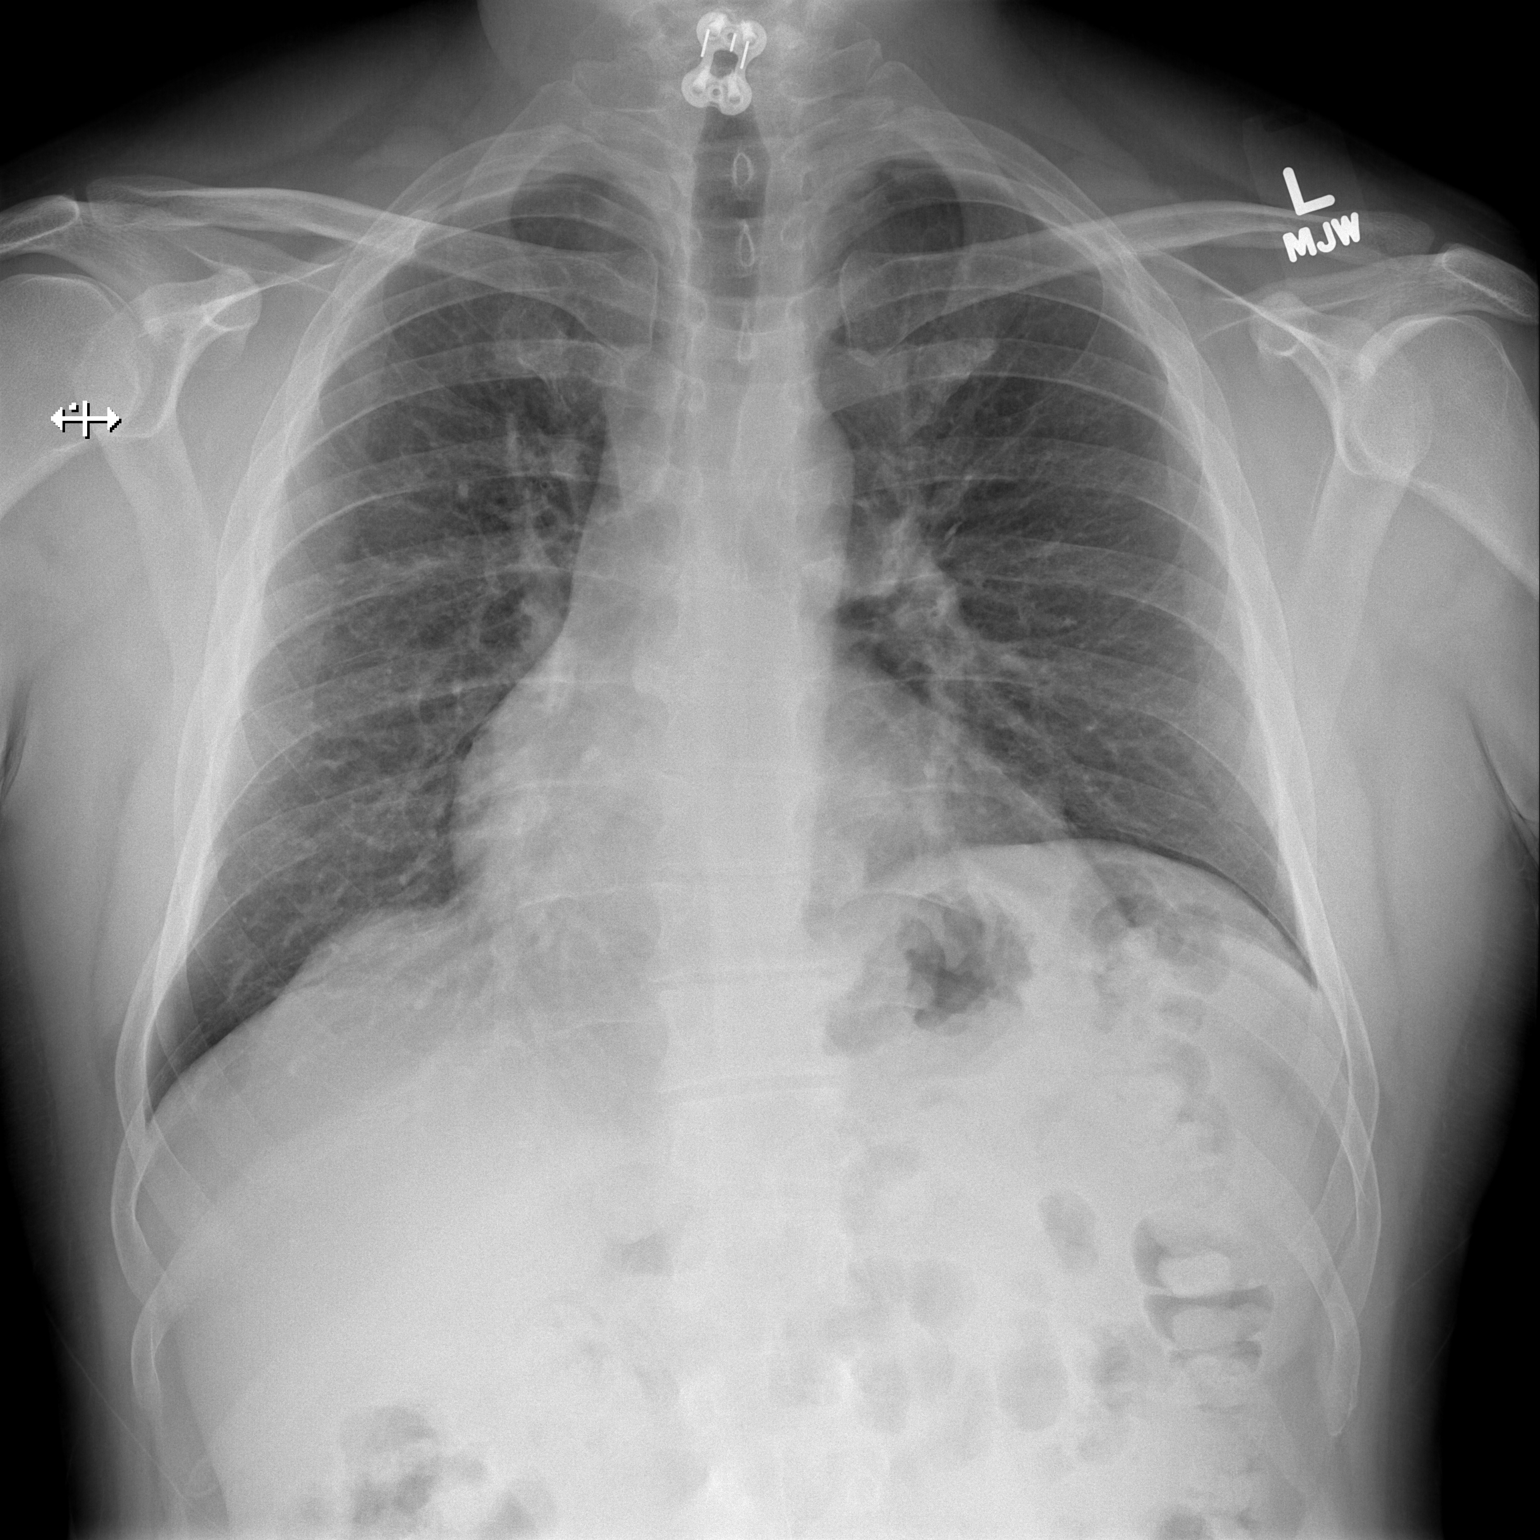

[w chest lat]
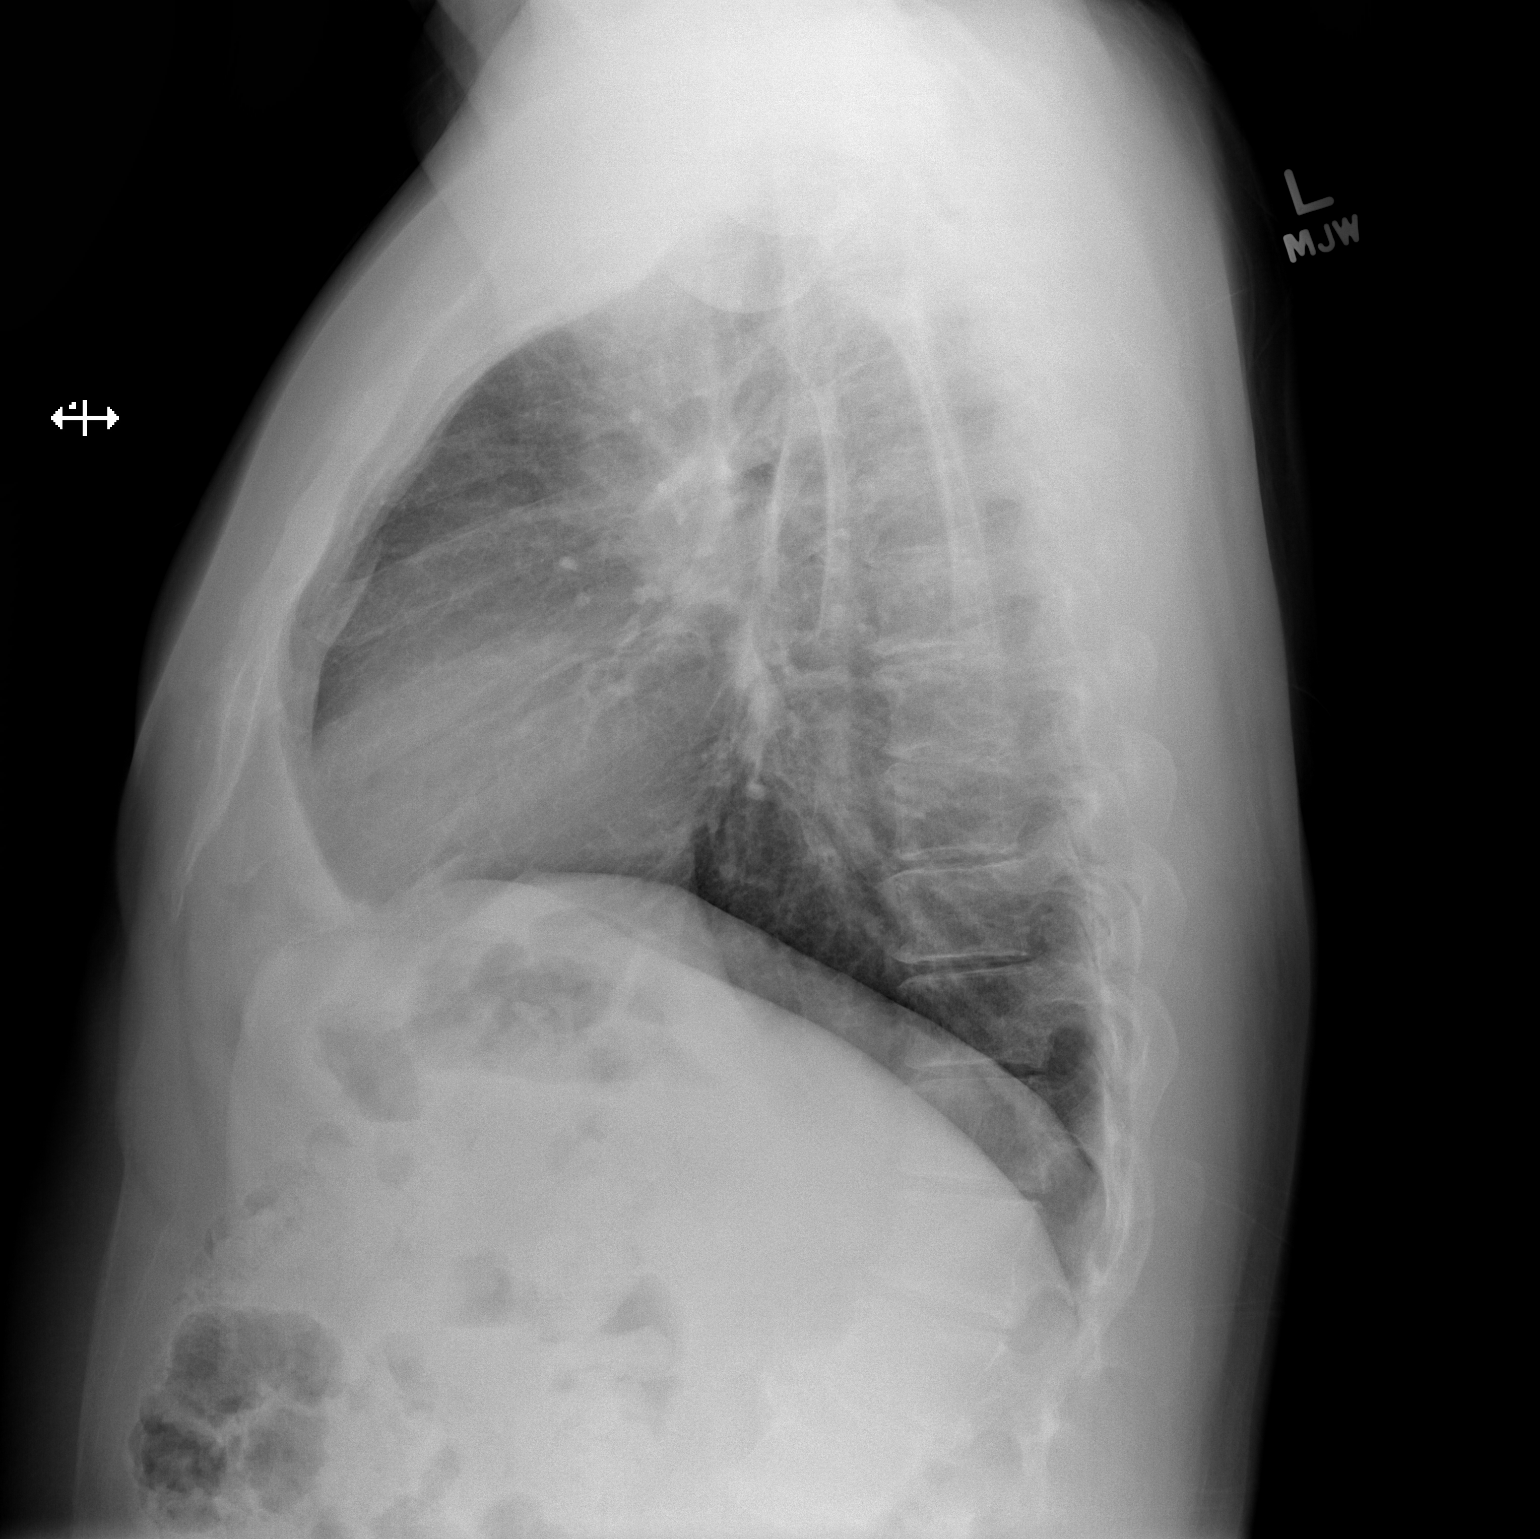

[2 of 2 positions shown; findings below may reference images not displayed]

FINDINGS: The lungs are adequately inflated. The interstitial markings are
more conspicuous bilaterally today. There is no alveolar infiltrate.
The heart is normal in size. The pulmonary vascularity is not
engorged. The trachea is midline. There is thoracolumbar curvature
centered at T12 and L1 without compression fractures.
IMPRESSION: Mildly increased lung markings likely reflect the patient's smoking
history. There is no pneumonia nor CHF nor other acute
cardiopulmonary abnormality.

## 2016-05-17 NOTE — Progress Notes (Signed)
Cardiology Office Note   Date:  05/18/2016   ID:  Dillon Macdonald, DOB 11/21/1970, MRN [id number]  PCP:  REDMON,NOELLE, PA-C  Cardiologist:   Chilton Si, MD   Chief Complaint  Patient presents with  . Follow-up    patient states wellbutrin is causing him too much fatgue.      History of Present Illness: Dillon Macdonald is a 46 y.o. male spinal stenosis, central hypogonadism, and tobacco abuse who presents for follow up on chest pain.  He was seen 01/17/16 with intermittent episodes of chest pain that is worse when walking.  He was referred for a cardiac CT-A 03/26/16 that revealed a coronary calcium score of 0 and mild dilation of the pulmonary artery (3.3 x 2.4 cm).  He has a reportedly normal stress test in 2014.   He has an extensive family history of CAD, including three older brothers.  One had PCI at age 99. He continues to smoke but has been trying to cut back. He does not think that Wellbutrin is helping. His main complaint today is fatigue. He's been sleeping 6 hours daily which is up from his baseline of 3. He denies any chest pain or shortness of breath. He also hasn't noted any lower extremity edema, orthopnea, or PND.   Past Medical History:  Diagnosis Date  . Atypical chest pain 01/17/2016  . Back pain   . Herpes simplex   . Hypogonadism male   . Shortness of breath    feels that way all the time, ? pain or Percocet  . Thrombocytopenia (HCC) 2014  . Tobacco abuse 05/18/2016    Past Surgical History:  Procedure Laterality Date  . BACK SURGERY     lumbar fusion L3- 4, S 1  . CERVICAL FUSION  2014  . VASECTOMY       Current Outpatient Prescriptions  Medication Sig Dispense Refill  . buprenorphine-naloxone (SUBOXONE) 8-2 MG SUBL SL tablet Place 1 tablet under the tongue daily.    Marland Kitchen buPROPion (WELLBUTRIN SR) 150 MG 12 hr tablet Take 1 tablet (150 mg total) by mouth daily. Daily for  1  Week then  Stop 60 tablet 8  . clomiPHENE (CLOMID) 50 MG tablet 1/4 tab, 3  times a week 5 tablet 5  . aspirin EC 81 MG tablet Take 1 tablet (81 mg total) by mouth daily. 90 tablet 3  . varenicline (CHANTIX STARTING MONTH PAK) 0.5 MG X 11 & 1 MG X 42 tablet Take one 0.5 mg tablet by mouth once daily for 3 days, then increase to one 0.5 mg tablet twice daily for 4 days, then increase to one 1 mg tablet twice daily. 53 tablet 0   No current facility-administered medications for this visit.     Allergies:   Patient has no known allergies.    Social History:  The patient  reports that he has been smoking Cigarettes.  He has a 40.00 pack-year smoking history. He has never used smokeless tobacco. He reports that he does not drink alcohol or use drugs.   Family History:  The patient's family history includes Heart attack in his brother, brother, and paternal uncle; Hypertension in his brother; Hyperthyroidism in his mother; Stroke in his father.    ROS:  Please see the history of present illness.   Otherwise, review of systems are positive for none.   All other systems are reviewed and negative.    PHYSICAL EXAM: VS:  BP 133/76   Pulse  75   Ht 5\' 9"  (1.753 m)   Wt 81.6 kg (179 lb 12.8 oz)   BMI 26.55 kg/m  , BMI Body mass index is 26.55 kg/m. GENERAL:  Well appearing HEENT:  Pupils equal round and reactive, fundi not visualized, oral mucosa unremarkable NECK:  No jugular venous distention, waveform within normal limits, carotid upstroke brisk and symmetric, no bruits LYMPHATICS:  No cervical adenopathy LUNGS:  Clear to auscultation bilaterally HEART:  RRR.  PMI not displaced or sustained,S1 and S2 within normal limits, no S3, no S4, no clicks, no rubs, no murmurs ABD:  Flat, positive bowel sounds normal in frequency in pitch, no bruits, no rebound, no guarding, no midline pulsatile mass, no hepatomegaly, no splenomegaly EXT:  2 plus pulses throughout, no edema, no cyanosis no clubbing.  R forearm with 1 inc area of erythema and induration.  Black eschar 0.5cm in  center SKIN:  No rashes no nodules NEURO:  Cranial nerves II through XII grossly intact, motor grossly intact throughout PSYCH:  Cognitively intact, oriented to person place and time   EKG:  EKG is ordered today. The ekg ordered today demonstrates sinus rhythm rate75 bpm.   Cardiac CT-A 03/26/16:    1. Coronary calcium score of 0. This was 0 percentile for age and sex matched control.  2. Normal coronary origin with right dominance. LAD and LCX have separate adjacent origins directly from the left coronary cusp.  3. No evidence of CAD.  4. There is mild dilatation of the pulmonary artery measuring 33 x 24 mm.   Recent Labs: 01/17/2016: ALT 9; BUN 16; Creat 1.02; Potassium 4.7; Sodium 137; TSH 1.53 02/26/2016: Hemoglobin 14.6; Platelets 197.0   11/19/15:  WBC 7.5, hemoglobin 13.8, hematocrit 41.7, platelets 157 Sodium 138, potassium 4.3, BUN 17, creatinine 0.91  Lipid Panel    Component Value Date/Time   CHOL 161 01/17/2016 1051   TRIG 73 01/17/2016 1051   HDL 36 (L) 01/17/2016 1051   CHOLHDL 4.5 01/17/2016 1051   VLDL 15 01/17/2016 1051   LDLCALC 110 01/17/2016 1051      Wt Readings from Last 3 Encounters:  05/18/16 81.6 kg (179 lb 12.8 oz)  03/26/16 78 kg (172 lb)  02/26/16 78 kg (172 lb)      ASSESSMENT AND PLAN:  # Chest pain: # Family history of CAD:  Resolved.  Coronary calcium score 0.  We discussed the fact that his ASCVD 10 year risk is 5.1% and does not have any coronary calcifications on CT. I recommended that he stop aspirin. However he is uncomfortable completely stopping it. He is willing to switch from 325 mg to 81 mg daily. We discussed the fact that the best thing to do to lower his risk is to stop smoking. He expressed understanding.    # Tobacco abuse: Wellbutrin is not helping. He will reduce it to 1 tablet daily for one week and then stop it. At that time he will start Chantix.  We discussed to smoking cessation for 5 minutes.   Current  medicines are reviewed at length with the patient today.  The patient does not have concerns regarding medicines.  The following changes have been made:  no change  Labs/ tests ordered today include:   Orders Placed This Encounter  Procedures  . EKG 12-Lead     Disposition:   FU with Carmelia Tiner C. Duke Salviaandolph, MD, [facility] in 1 year.    This note was written with the assistance of speech recognition software.  Please excuse any transcriptional errors.  Signed, Saja Bartolini C. Duke Salvia, MD, [facility]  05/18/2016 8:44 AM    [facility]

## 2016-05-18 ENCOUNTER — Ambulatory Visit: Payer: BLUE CROSS/BLUE SHIELD | Admitting: Cardiovascular Disease

## 2016-05-18 ENCOUNTER — Encounter: Payer: Self-pay | Admitting: Cardiovascular Disease

## 2016-05-18 ENCOUNTER — Ambulatory Visit (INDEPENDENT_AMBULATORY_CARE_PROVIDER_SITE_OTHER): Payer: BLUE CROSS/BLUE SHIELD | Admitting: Cardiovascular Disease

## 2016-05-18 VITALS — BP 133/76 | HR 75 | Ht 69.0 in | Wt 179.8 lb

## 2016-05-18 DIAGNOSIS — R5382 Chronic fatigue, unspecified: Secondary | ICD-10-CM | POA: Diagnosis not present

## 2016-05-18 DIAGNOSIS — Z720 Tobacco use: Secondary | ICD-10-CM | POA: Diagnosis not present

## 2016-05-18 DIAGNOSIS — R079 Chest pain, unspecified: Secondary | ICD-10-CM

## 2016-05-18 DIAGNOSIS — Z8249 Family history of ischemic heart disease and other diseases of the circulatory system: Secondary | ICD-10-CM

## 2016-05-18 HISTORY — DX: Tobacco use: Z72.0

## 2016-05-18 MED ORDER — VARENICLINE TARTRATE 0.5 MG X 11 & 1 MG X 42 PO MISC: ORAL | 0 refills | Status: DC

## 2016-05-18 MED ORDER — BUPROPION HCL ER (SR) 150 MG PO TB12: 150.0000 mg | ORAL_TABLET | Freq: Every day | ORAL | 8 refills | Status: DC

## 2016-05-18 MED ORDER — ASPIRIN EC 81 MG PO TBEC: 81.0000 mg | DELAYED_RELEASE_TABLET | Freq: Every day | ORAL | 3 refills | Status: AC

## 2016-05-18 NOTE — Patient Instructions (Signed)
Your physician has recommended you make the following change in your medication:  DECREASE  ASPIRIN  TO  81  MG EVERY  DAY  WELLBUTRIN 150 MG   DAILY FOR  1 WEEK THEN STOP  START  CHANTIX  START PAK AS DIRECTED   Your physician wants you to follow-up in:  YEAR [facility]  [facility]  You will receive a reminder letter in the mail two months in advance. If you don't receive a letter, please call our office to schedule the follow-up appointment.

## 2016-06-19 ENCOUNTER — Other Ambulatory Visit: Payer: Self-pay | Admitting: Cardiovascular Disease

## 2016-06-19 NOTE — Telephone Encounter (Signed)
Refill Request.  

## 2016-06-22 ENCOUNTER — Other Ambulatory Visit: Payer: Self-pay

## 2016-06-22 MED ORDER — VARENICLINE TARTRATE 1 MG PO TABS: 1.0000 mg | ORAL_TABLET | Freq: Two times a day (BID) | ORAL | 3 refills | Status: DC

## 2016-06-22 NOTE — Telephone Encounter (Signed)
Rx(s) sent to pharmacy electronically.  

## 2016-06-23 NOTE — Telephone Encounter (Signed)
Please refill as 1mg  bid X30 days with 1 refill

## 2016-08-17 ENCOUNTER — Other Ambulatory Visit: Payer: Self-pay

## 2016-08-17 ENCOUNTER — Other Ambulatory Visit: Payer: Self-pay | Admitting: Cardiovascular Disease

## 2016-08-17 NOTE — Telephone Encounter (Signed)
Refill Request.  

## 2017-02-25 ENCOUNTER — Ambulatory Visit: Payer: BLUE CROSS/BLUE SHIELD | Admitting: Endocrinology

## 2017-02-26 ENCOUNTER — Ambulatory Visit: Payer: BLUE CROSS/BLUE SHIELD | Admitting: Endocrinology

## 2017-10-18 ENCOUNTER — Other Ambulatory Visit: Payer: Self-pay

## 2017-10-18 ENCOUNTER — Encounter (HOSPITAL_COMMUNITY): Payer: Self-pay | Admitting: *Deleted

## 2017-10-18 ENCOUNTER — Ambulatory Visit (HOSPITAL_COMMUNITY)
Admission: EM | Admit: 2017-10-18 | Discharge: 2017-10-18 | Disposition: A | Discharge status: Nursing Home (Includes ICF) | Payer: BLUE CROSS/BLUE SHIELD | Source: Home / Self Care | Attending: Family Medicine | Admitting: Family Medicine

## 2017-10-18 ENCOUNTER — Emergency Department (HOSPITAL_COMMUNITY)
Admission: EM | Admit: 2017-10-18 | Discharge: 2017-10-18 | Disposition: A | Discharge status: Home / Self Care | Payer: BLUE CROSS/BLUE SHIELD | Attending: Emergency Medicine | Admitting: Emergency Medicine

## 2017-10-18 ENCOUNTER — Emergency Department (HOSPITAL_COMMUNITY): Payer: BLUE CROSS/BLUE SHIELD

## 2017-10-18 ENCOUNTER — Encounter (HOSPITAL_COMMUNITY): Payer: Self-pay | Admitting: Emergency Medicine

## 2017-10-18 DIAGNOSIS — R600 Localized edema: Secondary | ICD-10-CM | POA: Diagnosis not present

## 2017-10-18 DIAGNOSIS — R0602 Shortness of breath: Secondary | ICD-10-CM

## 2017-10-18 DIAGNOSIS — Z7982 Long term (current) use of aspirin: Secondary | ICD-10-CM | POA: Diagnosis not present

## 2017-10-18 DIAGNOSIS — F17210 Nicotine dependence, cigarettes, uncomplicated: Secondary | ICD-10-CM | POA: Diagnosis not present

## 2017-10-18 DIAGNOSIS — Z79899 Other long term (current) drug therapy: Secondary | ICD-10-CM | POA: Diagnosis not present

## 2017-10-18 DIAGNOSIS — R0789 Other chest pain: Secondary | ICD-10-CM

## 2017-10-18 LAB — COMPREHENSIVE METABOLIC PANEL
ALT: 26 U/L (ref 0–44)
AST: 25 U/L (ref 15–41)
Albumin: 3.7 g/dL (ref 3.5–5.0)
Alkaline Phosphatase: 55 U/L (ref 38–126)
Anion gap: 7 (ref 5–15)
BUN: 17 mg/dL (ref 6–20)
CO2: 27 mmol/L (ref 22–32)
Calcium: 8.6 mg/dL — ABNORMAL LOW (ref 8.9–10.3)
Chloride: 105 mmol/L (ref 98–111)
Creatinine, Ser: 1.33 mg/dL — ABNORMAL HIGH (ref 0.61–1.24)
GFR calc Af Amer: >60 mL/min (ref >60)
GFR calc non Af Amer: >60 mL/min (ref >60)
Glucose, Bld: 92 mg/dL (ref 70–99)
Potassium: 4.4 mmol/L (ref 3.5–5.1)
Sodium: 139 mmol/L (ref 135–145)
Total Bilirubin: 0.8 mg/dL (ref 0.3–1.2)
Total Protein: 5.7 g/dL — ABNORMAL LOW (ref 6.5–8.1)

## 2017-10-18 LAB — CBC
HCT: 45.7 % (ref 39.0–52.0)
Hemoglobin: 14.6 g/dL (ref 13.0–17.0)
MCH: 29 pg (ref 26.0–34.0)
MCHC: 31.9 g/dL (ref 30.0–36.0)
MCV: 90.7 fL (ref 78.0–100.0)
Platelets: 137 10*3/uL — ABNORMAL LOW (ref 150–400)
RBC: 5.04 MIL/uL (ref 4.22–5.81)
RDW: 15.1 % (ref 11.5–15.5)
WBC: 6.8 10*3/uL (ref 4.0–10.5)

## 2017-10-18 LAB — BRAIN NATRIURETIC PEPTIDE: B Natriuretic Peptide: 10.6 pg/mL (ref 0.0–100.0)

## 2017-10-18 LAB — I-STAT TROPONIN, ED
Troponin i, poc: 0 ng/mL (ref 0.00–0.08)
Troponin i, poc: 0 ng/mL (ref 0.00–0.08)

## 2017-10-18 LAB — URINALYSIS, ROUTINE W REFLEX MICROSCOPIC
Bilirubin Urine: NEGATIVE
Glucose, UA: NEGATIVE mg/dL
Hgb urine dipstick: NEGATIVE
Ketones, ur: NEGATIVE mg/dL
Leukocytes, UA: NEGATIVE
Nitrite: NEGATIVE
Protein, ur: NEGATIVE mg/dL
Specific Gravity, Urine: 1.025 (ref 1.005–1.030)
pH: 5 (ref 5.0–8.0)

## 2017-10-18 MED ORDER — NITROGLYCERIN 0.4 MG SL SUBL
SUBLINGUAL_TABLET | SUBLINGUAL | Status: AC
  Filled 2017-10-18: qty 1

## 2017-10-18 MED ORDER — FUROSEMIDE 10 MG/ML IJ SOLN
20.0000 mg | Freq: Once | INTRAMUSCULAR | Status: AC
  Administered 2017-10-18: 20 mg via INTRAVENOUS
  Filled 2017-10-18: qty 2

## 2017-10-18 MED ORDER — NITROGLYCERIN 0.4 MG SL SUBL
0.4000 mg | SUBLINGUAL_TABLET | Freq: Once | SUBLINGUAL | Status: AC
  Administered 2017-10-18: 0.4 mg via SUBLINGUAL

## 2017-10-18 MED ORDER — NITROGLYCERIN 2 % TD OINT
1.0000 [in_us] | TOPICAL_OINTMENT | Freq: Once | TRANSDERMAL | Status: AC
  Administered 2017-10-18: 1 [in_us] via TOPICAL
  Filled 2017-10-18: qty 1

## 2017-10-18 MED ORDER — FUROSEMIDE 20 MG PO TABS: 20.0000 mg | ORAL_TABLET | Freq: Every day | ORAL | 0 refills | Status: DC

## 2017-10-18 NOTE — ED Triage Notes (Signed)
Pt states has had cp x 7 days got worse today and he has had swelling in lower legs and hands, states was bitten by brown recluse 7 days ago and placed on DOXY , pt has sob oat rest at times went to [facility] gven 4 baby asa, and 1 nitro and ems gave another nitro pain ow 4/10

## 2017-10-18 NOTE — Discharge Instructions (Signed)
Seen today for lower extremity swelling and shortness of breath.  Your work-up here is fairly reassuring.  He will be given 5 days of Lasix.  Reduce salt in your diet.  It is very important that you follow-up with cardiology for echocardiogram.

## 2017-10-18 NOTE — ED Provider Notes (Signed)
[facility] EMERGENCY DEPARTMENT Provider Note   CSN: [id number] Arrival date & time: 10/18/17  1438     History   Chief Complaint Chief Complaint  Patient presents with  . Chest Pain    HPI Dillon Macdonald is a 47 y.o. male.  HPI  This 47 year old male with a history of smoking who presents with shortness of breath and lower extremity swelling.  Patient reports 8 to 10-day history of worsening lower extremity swelling.  He has noted that his work boots have gotten tighter.  He feels like this helps moved outward and "everything feels tight."  He also reports shortness of breath.  He describes orthopnea and difficulty sleeping over the last 3 nights.  Denies chest pain but does state "at times it is tight."  He does report recent history of increased soda intake and Gatorade intake.  He states he normally drinks a lot of water.  He also works in a very hot environment.  He denies any nausea, vomiting, diarrhea.  He does report that 8 days ago he sustained a spider bite to the right forearm.  He has been on doxycycline.  He denies any recent fevers or other rashes.  Patient self reports that a proximally 1 year ago he had a CT coronary study which was "okay."  I have reviewed the patient's chart and his coronary calcium score was 0 at that time.  She was seen in urgent care today and was referred here for further management.  He was given nitroglycerin which he states did "help some"  Past Medical History:  Diagnosis Date  . Atypical chest pain 01/17/2016  . Back pain   . Herpes simplex   . Hypogonadism male   . Shortness of breath    feels that way all the time, ? pain or Percocet  . Thrombocytopenia (HCC) 2014  . Tobacco abuse 05/18/2016    Patient Active Problem List   Diagnosis Date Noted  . Tobacco abuse 05/18/2016  . Atypical chest pain 01/17/2016  . Hypogonadism male   . Spinal stenosis, lumbar region, with neurogenic claudication 11/06/2013  . Lumbar  stenosis with neurogenic claudication 11/06/2013  . Thrombocytopenia, unspecified (HCC) 12/21/2012    Past Surgical History:  Procedure Laterality Date  . BACK SURGERY     lumbar fusion L3- 4, S 1  . CERVICAL FUSION  2014  . VASECTOMY          Home Medications    Prior to Admission medications   Medication Sig Start Date End Date Taking? Authorizing Provider  aspirin EC 81 MG tablet Take 1 tablet (81 mg total) by mouth daily. 05/18/16  Yes Chilton Si, MD  buprenorphine-naloxone (SUBOXONE) 8-2 MG SUBL SL tablet Place 1 tablet under the tongue daily.   Yes [provider]  doxycycline (VIBRA-TABS) 100 MG tablet Take 100 mg by mouth 2 (two) times daily. For 10 days 10/10/17 10/20/17 Yes [provider]  buPROPion (WELLBUTRIN SR) 150 MG 12 hr tablet Take 1 tablet (150 mg total) by mouth daily. Daily for  1  Week then  Stop Patient not taking: Reported on 10/18/2017 05/18/16   Chilton Si, MD  CHANTIX CONTINUING MONTH PAK 1 MG tablet TAKE 1 TABLET (1 MG TOTAL) BY MOUTH 2 (TWO) TIMES DAILY. Patient not taking: Reported on 10/18/2017 08/21/16   Chilton Si, MD  clomiPHENE (CLOMID) 50 MG tablet 1/4 tab, 3 times a week Patient not taking: Reported on 10/18/2017 01/01/16   Everardo All,  Gregary Signs, MD  furosemide (LASIX) 20 MG tablet Take 1 tablet (20 mg total) by mouth daily. 10/18/17   Horton, Mayer Masker, MD    Family History Family History  Problem Relation Age of Onset  . Hyperthyroidism Mother   . Stroke Father   . Heart attack Brother   . Heart attack Paternal Uncle   . Heart attack Brother   . Hypertension Brother   . Other Neg Hx        low testosterone    Social History Social History   Tobacco Use  . Smoking status: Current Every Day Smoker    Packs/day: 2.00    Years: 20.00    Pack years: 40.00    Types: Cigarettes  . Smokeless tobacco: Never Used  Substance Use Topics  . Alcohol use: No  . Drug use: No     Allergies   Sildenafil   Review of  Systems Review of Systems  Constitutional: Negative for fever.  Respiratory: Positive for chest tightness and shortness of breath. Negative for cough.   Cardiovascular: Positive for leg swelling. Negative for chest pain.  Gastrointestinal: Positive for abdominal distention. Negative for abdominal pain, nausea and vomiting.  Genitourinary: Negative for dysuria.  Skin: Negative for rash.  Neurological: Negative for headaches.  All other systems reviewed and are negative.    Physical Exam Updated Vital Signs BP 125/75   Pulse 76   Temp 98.8 F (37.1 C) (Oral)   Resp 16   SpO2 95%   Physical Exam  Constitutional: He is oriented to person, place, and time. He appears well-developed and well-nourished. No distress.  HENT:  Head: Normocephalic and atraumatic.  Neck: Neck supple. No JVD present.  Cardiovascular: Normal rate, regular rhythm, normal heart sounds and normal pulses.  No murmur heard. Pulmonary/Chest: Effort normal and breath sounds normal. No respiratory distress. He has no decreased breath sounds. He has no wheezes.  Abdominal: Soft. Bowel sounds are normal. He exhibits distension. There is no tenderness. There is no rebound.  Musculoskeletal:       Right lower leg: He exhibits edema.       Left lower leg: He exhibits edema.  Lymphadenopathy:    He has no cervical adenopathy.  Neurological: He is alert and oriented to person, place, and time.  Skin: Skin is warm and dry.  Psychiatric: He has a normal mood and affect.  Nursing note and vitals reviewed.    ED Treatments / Results  Labs (all labs ordered are listed, but only abnormal results are displayed) Labs Reviewed  CBC - Abnormal; Notable for the following components:      Result Value   Platelets 137 (*)    All other components within normal limits  COMPREHENSIVE METABOLIC PANEL - Abnormal; Notable for the following components:   Creatinine, Ser 1.33 (*)    Calcium 8.6 (*)    Total Protein 5.7 (*)     All other components within normal limits  URINALYSIS, ROUTINE W REFLEX MICROSCOPIC  BRAIN NATRIURETIC PEPTIDE  I-STAT TROPONIN, ED  I-STAT TROPONIN, ED    EKG EKG Interpretation  Date/Time:  Monday October 18 2017 14:42:58 EDT Ventricular Rate:  90 PR Interval:    QRS Duration: 98 QT Interval:  287 QTC Calculation: 352 R Axis:   69 Text Interpretation:  Sinus rhythm Short PR interval Abnormal R-wave progression, early transition Nonspecific T abnormalities, inferior leads No significant change since last tracing Confirmed by Ross Marcus ([id number]) on 10/18/2017 3:05:28 PM  Radiology Dg Chest 2 View  Result Date: 10/18/2017 CLINICAL DATA:  Onset of chest pain 7 days ago with increasing severity today associated with peripheral edema. The patient reports a brown recluse spider bite 7 days ago and has been on antibiotics since then. Current smoker. EXAM: CHEST - 2 VIEW COMPARISON:  Chest x-ray of July 23, 2014 FINDINGS: The lungs are adequately inflated. There is no focal infiltrate. There is no pleural effusion. The heart and pulmonary vascularity are normal. The mediastinum is normal in width. There is mild multilevel degenerative disc disease of the lower thoracic spine. The patient has undergone posterior fusion of the lower thoracic and lumbar spines. IMPRESSION: Mild chronic bronchitic-smoking related changes. No acute pneumonia nor CHF. Electronically Signed   By: David  SwazilandJordan M.D.   On: 10/18/2017 16:05    Procedures Procedures (including critical care time)  Medications Ordered in ED Medications  nitroGLYCERIN (NITROGLYN) 2 % ointment 1 inch (1 inch Topical Given 10/18/17 1550)  furosemide (LASIX) injection 20 mg (20 mg Intravenous Given 10/18/17 1749)     Initial Impression / Assessment and Plan / ED Course  I have reviewed the triage vital signs and the nursing notes.  Pertinent labs & imaging results that were available during my care of the patient were reviewed by me and  considered in my medical decision making (see chart for details).     She presents with lower extremity swelling and shortness of breath.  At times reports chest tightness.  He is overall nontoxic-appearing on exam and vital signs reassuring.  O2 sats 95%.  He is in no respiratory distress.  He does have some evidence of lower extremity swelling.  Considerations include heart failure, ACS, renal disease, diet derangement.  Lab work obtained and largely reassuring.  Troponin is negative.  EKG shows some nonspecific changes but no ST elevation or acute ischemia.  Troponin x2 is negative.  He has not had any ongoing chest pain while here.  Additionally, most metabolites are normal.  No protein in the urine to suggest nephropathy.  X-ray shows no evidence of volume overload.  I have reviewed his chart and he had a coronary CT with a coronary calcium score of 0.  Patient was given 1 dose of 20 mg of Lasix.  Will trial on Lasix for 5 days at a low dose.  Also have encouraged patient to avoid salt in his diet.  He needs to follow-up closely with cardiology for echocardiogram and further evaluation.  Patient stated understanding.  After history, exam, and medical workup I feel the patient has been appropriately medically screened and is safe for discharge home. Pertinent diagnoses were discussed with the patient. Patient was given return precautions.   Final Clinical Impressions(s) / ED Diagnoses   Final diagnoses:  Lower extremity edema  SOB (shortness of breath)    ED Discharge Orders        Ordered    furosemide (LASIX) 20 MG tablet  Daily     10/18/17 1919       Shon BatonHorton, Courtney F, MD 10/18/17 2345

## 2017-10-18 NOTE — ED Notes (Signed)
Called lab to add on lab work 

## 2017-10-18 NOTE — ED Provider Notes (Signed)
Dillon Macdonald   [id number] 10/18/17 Arrival Time: 1314  ASSESSMENT & PLAN:  1. Chest tightness   2. SOB (shortness of breath)   3. Bilateral lower extremity edema    ECG compared to previous dated 05/18/2016 reveals new inverted T-waves in leads II, III, aVF as well as in V5 and V6.  IV placed.  ASA taken this morning per patient. Oxygen started. Nitroglycerine given with some improvement in chest symptoms.  Reviewed expectations re: course of current medical issues. Questions answered. Outlined signs and symptoms indicating need for more acute intervention. Patient verbalized understanding. After Visit Summary given.   SUBJECTIVE:  Dillon PellegriniJohn D Macdonald is a 47 y.o. male who presents with complaint of:  Chest Discomfort/SOB: Described as 'tightness'. Onset gradual, over the past several days, with unchanged course since that time. Intermittent. No radiation. Has felt SOB at rest for the past couple of days; persistent today. New onset bilateral ankle swelling for the past week. No h/o similar. Rates discomfort as a 3/10 in intensity. Associated symptoms: dyspnea. Aggravating factors: none reported. Alleviating factors: none reported. Also complains of abdomen feeling bloated. Mild nausea at times without emesis.  Did take a 325mg  ASA this morning. Takes daily.  Cardiac risk factors: family history of premature cardiovascular disease, male gender and smoking/ tobacco exposure. Has seen cardiology in the past "just to check everything out" given strong FH of cardiac disease.  Social History   Tobacco Use  Smoking Status Current Every Day Smoker  . Packs/day: 2.00  . Years: 20.00  . Pack years: 40.00  . Types: Cigarettes  Smokeless Tobacco Never Used   OTC treatment: none.  ROS: As per HPI. All other systems negative.   OBJECTIVE:  Vitals:   10/18/17 1352  BP: (!) 151/95  Pulse: 95  Resp: 20  Temp: 98 F (36.7 C)  TempSrc: Oral  SpO2: 98%    General  appearance: alert; no distress; no diaphoresis Eyes: PERRLA; EOMI; conjunctiva normal HENT: normocephalic; atraumatic Neck: supple Lungs: overall clear to auscultation bilaterally but appears winded the longer he speaks or takes deep breaths Heart: regular rate and rhythm Chest Wall: non-tender Abdomen: soft, non-tender; bowel sounds normal; no guarding or rebound tenderness Extremities: bilateral ankle edema; symmetrical with no gross deformities Skin: warm and dry Psychological: alert and cooperative; normal mood and affect  ECG: Orders placed or performed during the hospital encounter of 10/18/17  . ED EKG  . ED EKG    No Known Allergies  Past Medical History:  Diagnosis Date  . Atypical chest pain 01/17/2016  . Back pain   . Herpes simplex   . Hypogonadism male   . Shortness of breath    feels that way all the time, ? pain or Percocet  . Thrombocytopenia (HCC) 2014  . Tobacco abuse 05/18/2016   Social History   Socioeconomic History  . Marital status: Divorced    Spouse name: Not on file  . Number of children: Not on file  . Years of education: Not on file  . Highest education level: Not on file  Occupational History  . Not on file  Social Needs  . Financial resource strain: Not on file  . Food insecurity:    Worry: Not on file    Inability: Not on file  . Transportation needs:    Medical: Not on file    Non-medical: Not on file  Tobacco Use  . Smoking status: Current Every Day Smoker    Packs/day: 2.00  Years: 20.00    Pack years: 40.00    Types: Cigarettes  . Smokeless tobacco: Never Used  Substance and Sexual Activity  . Alcohol use: No  . Drug use: No  . Sexual activity: Not on file  Lifestyle  . Physical activity:    Days per week: Not on file    Minutes per session: Not on file  . Stress: Not on file  Relationships  . Social connections:    Talks on phone: Not on file    Gets together: Not on file    Attends religious service: Not on file      Active member of club or organization: Not on file    Attends meetings of clubs or organizations: Not on file    Relationship status: Not on file  . Intimate partner violence:    Fear of current or ex partner: Not on file    Emotionally abused: Not on file    Physically abused: Not on file    Forced sexual activity: Not on file  Other Topics Concern  . Not on file  Social History Narrative  . Not on file   Family History  Problem Relation Age of Onset  . Hyperthyroidism Mother   . Stroke Father   . Heart attack Brother   . Heart attack Paternal Uncle   . Heart attack Brother   . Hypertension Brother   . Other Neg Hx        low testosterone   Past Surgical History:  Procedure Laterality Date  . BACK SURGERY     lumbar fusion L3- 4, S 1  . CERVICAL FUSION  2014  . Peterson Lombard, MD 10/18/17 1421

## 2017-10-18 NOTE — ED Notes (Signed)
Pt ambulated around the hallway, pt statedhe was not dizzy. Pt's O2 sat stayed at 97% and Pulse stayed around 105. Informed Dr. Wilkie AyeHorton.

## 2017-10-18 NOTE — ED Triage Notes (Signed)
C/o chest tightness with bilateral pedal and ankle edema x 8 days. States he can't lay down feels very sob.

## 2017-10-18 NOTE — ED Notes (Signed)
Patient transported to X-ray 

## 2017-10-27 ENCOUNTER — Other Ambulatory Visit (HOSPITAL_COMMUNITY): Payer: Self-pay | Admitting: Physician Assistant

## 2017-10-27 DIAGNOSIS — R609 Edema, unspecified: Secondary | ICD-10-CM

## 2017-11-02 ENCOUNTER — Ambulatory Visit (HOSPITAL_COMMUNITY): Payer: BLUE CROSS/BLUE SHIELD | Attending: Cardiology

## 2017-11-02 ENCOUNTER — Other Ambulatory Visit: Payer: Self-pay

## 2017-11-02 DIAGNOSIS — Z8249 Family history of ischemic heart disease and other diseases of the circulatory system: Secondary | ICD-10-CM | POA: Diagnosis not present

## 2017-11-02 DIAGNOSIS — Z720 Tobacco use: Secondary | ICD-10-CM | POA: Diagnosis not present

## 2017-11-02 DIAGNOSIS — R0789 Other chest pain: Secondary | ICD-10-CM | POA: Insufficient documentation

## 2017-11-02 DIAGNOSIS — R609 Edema, unspecified: Secondary | ICD-10-CM | POA: Diagnosis not present

## 2017-11-02 DIAGNOSIS — R0600 Dyspnea, unspecified: Secondary | ICD-10-CM | POA: Diagnosis not present

## 2017-11-02 MED ORDER — PERFLUTREN LIPID MICROSPHERE
1.0000 mL | INTRAVENOUS | Status: AC | PRN
  Administered 2017-11-02: 3 mL via INTRAVENOUS

## 2022-05-27 ENCOUNTER — Other Ambulatory Visit (HOSPITAL_COMMUNITY): Payer: Self-pay | Admitting: Neurosurgery

## 2022-05-27 ENCOUNTER — Ambulatory Visit (HOSPITAL_COMMUNITY)
Admission: RE | Admit: 2022-05-27 | Discharge: 2022-05-27 | Disposition: A | Discharge status: Home / Self Care | Payer: Medicaid Other | Source: Ambulatory Visit | Attending: Neurosurgery | Admitting: Neurosurgery

## 2022-05-27 DIAGNOSIS — M5412 Radiculopathy, cervical region: Secondary | ICD-10-CM

## 2022-07-14 ENCOUNTER — Other Ambulatory Visit: Payer: Self-pay | Admitting: Neurosurgery

## 2022-07-20 NOTE — Pre-Procedure Instructions (Signed)
Surgical Instructions    Your procedure is scheduled on Monday 07/27/22.   Report to [facility] Main Entrance "A" at 07:30 A.M., then check in with the Admitting office.  Call this number if you have problems the morning of surgery:  [phone]   If you have any questions prior to your surgery date call [phone]: Open Monday-Friday 8am-4pm If you experience any cold or flu symptoms such as cough, fever, chills, shortness of breath, etc. between now and your scheduled surgery, please notify us at the above number     Remember:  Do not eat or drink after midnight the night before your surgery     Take these medicines the morning of surgery with A SIP OF WATER:   gabapentin (NEURONTIN)    busPIRone (BUSPAR)- If needed   As of today, STOP taking any Aspirin (unless otherwise instructed by your surgeon) Aleve, Naproxen, Ibuprofen, Motrin, Advil, Goody's, BC's, all herbal medications, fish oil, and all vitamins.           Do not wear jewelry or makeup. Do not wear lotions, powders, perfumes/cologne or deodorant. Do not shave 48 hours prior to surgery.  Men may shave face and neck. Do not bring valuables to the hospital. Do not wear nail polish, gel polish, artificial nails, or any other type of covering on natural nails (fingers and toes) If you have artificial nails or gel coating that need to be removed by a nail salon, please have this removed prior to surgery. Artificial nails or gel coating may interfere with anesthesia's ability to adequately monitor your vital signs.  [facility] is not responsible for any belongings or valuables.    Do NOT Smoke (Tobacco/Vaping)  24 hours prior to your procedure  If you use a CPAP at night, you may bring your mask for your overnight stay.   Contacts, glasses, hearing aids, dentures or partials may not be worn into surgery, please bring cases for these belongings   For patients admitted to the hospital, discharge time will be determined  by your treatment team.   Patients discharged the day of surgery will not be allowed to drive home, and someone needs to stay with them for 24 hours.   SURGICAL WAITING ROOM VISITATION Patients having surgery or a procedure may have no more than 2 support people in the waiting area - these visitors may rotate.   Children under the age of 10 must have an adult with them who is not the patient. If the patient needs to stay at the hospital during part of their recovery, the visitor guidelines for inpatient rooms apply. Pre-op nurse will coordinate an appropriate time for 1 support person to accompany patient in pre-op.  This support person may not rotate.   Please refer to [email or web address] for the visitor guidelines for Inpatients (after your surgery is over and you are in a regular room).    Special instructions:    Oral Hygiene is also important to reduce your risk of infection.  Remember - BRUSH YOUR TEETH THE MORNING OF SURGERY WITH YOUR REGULAR TOOTHPASTE   [facility]- Preparing For Surgery  Before surgery, you can play an important role. Because skin is not sterile, your skin needs to be as free of germs as possible. You can reduce the number of germs on your skin by washing with CHG (chlorahexidine gluconate) Soap before surgery.  CHG is an antiseptic cleaner which kills germs and bonds with the skin to continue killing germs even after  washing.     Please do not use if you have an allergy to CHG or antibacterial soaps. If your skin becomes reddened/irritated stop using the CHG.  Do not shave (including legs and underarms) for at least 48 hours prior to first CHG shower. It is OK to shave your face.  Please follow the 5 CHG Bath pre-op instructions carefully. The sheet is attached.     Day of Surgery:  Take a shower with CHG soap. Wear Clean/Comfortable clothing the morning of surgery Do not apply any deodorants/lotions.    Remember to brush your teeth WITH YOUR REGULAR TOOTHPASTE.    If you received a COVID test during your pre-op visit, it is requested that you wear a mask when out in public, stay away from anyone that may not be feeling well, and notify your surgeon if you develop symptoms. If you have been in contact with anyone that has tested positive in the last 10 days, please notify your surgeon.    Please read over the following fact sheets that you were given.

## 2022-07-21 ENCOUNTER — Encounter (HOSPITAL_COMMUNITY): Payer: Self-pay

## 2022-07-21 ENCOUNTER — Other Ambulatory Visit: Payer: Self-pay

## 2022-07-21 ENCOUNTER — Encounter (HOSPITAL_COMMUNITY)
Admission: RE | Admit: 2022-07-21 | Discharge: 2022-07-21 | Disposition: A | Discharge status: Home / Self Care | Payer: Medicaid Other | Source: Ambulatory Visit | Attending: Neurosurgery | Admitting: Neurosurgery

## 2022-07-21 VITALS — BP 126/87 | HR 67 | Temp 98.1°F | Resp 18 | Ht 69.0 in | Wt 173.5 lb

## 2022-07-21 DIAGNOSIS — D696 Thrombocytopenia, unspecified: Secondary | ICD-10-CM | POA: Diagnosis not present

## 2022-07-21 DIAGNOSIS — Z01818 Encounter for other preprocedural examination: Secondary | ICD-10-CM | POA: Diagnosis present

## 2022-07-21 HISTORY — DX: Unspecified osteoarthritis, unspecified site: M19.90

## 2022-07-21 LAB — BASIC METABOLIC PANEL
Anion gap: 8 (ref 5–15)
BUN: 23 mg/dL — ABNORMAL HIGH (ref 6–20)
CO2: 26 mmol/L (ref 22–32)
Calcium: 9.1 mg/dL (ref 8.9–10.3)
Chloride: 105 mmol/L (ref 98–111)
Creatinine, Ser: 0.89 mg/dL (ref 0.61–1.24)
GFR, Estimated: >60 mL/min (ref >60)
Glucose, Bld: 95 mg/dL (ref 70–99)
Potassium: 4.2 mmol/L (ref 3.5–5.1)
Sodium: 139 mmol/L (ref 135–145)

## 2022-07-21 LAB — CBC
HCT: 43.4 % (ref 39.0–52.0)
Hemoglobin: 14.9 g/dL (ref 13.0–17.0)
MCH: 30.3 pg (ref 26.0–34.0)
MCHC: 34.3 g/dL (ref 30.0–36.0)
MCV: 88.4 fL (ref 80.0–100.0)
Platelets: 134 10*3/uL — ABNORMAL LOW (ref 150–400)
RBC: 4.91 MIL/uL (ref 4.22–5.81)
RDW: 13.5 % (ref 11.5–15.5)
WBC: 5.5 10*3/uL (ref 4.0–10.5)
nRBC: 0 % (ref 0.0–0.2)

## 2022-07-21 LAB — SURGICAL PCR SCREEN
MRSA, PCR: NEGATIVE
Staphylococcus aureus: NEGATIVE

## 2022-07-21 NOTE — Progress Notes (Signed)
PCP - Milus Height, PA Cardiologist - tiffany Axis- last seen in 2018  PPM/ICD - denies   Chest x-ray - n/a EKG - 07/21/22 Stress Test - 2014- per [facility] note- patient doesn't remember where he had it done at [facility] - 11/02/17 Cardiac Cath - denies  Sleep Study - denies   ERAS Protcol -no   COVID TEST- not needed   Anesthesia review: yes, might need cardiac clearance. Dr. Duke Salvia wanted to see him back in a year and the patient never followed up.   Patient denies shortness of breath, fever, cough and chest pain at PAT appointment   All instructions explained to the patient, with a verbal understanding of the material. Patient agrees to go over the instructions while at home for a better understanding. Patient also instructed to self quarantine after being tested for COVID-19. The opportunity to ask questions was provided.

## 2022-07-22 NOTE — Anesthesia Preprocedure Evaluation (Addendum)
Anesthesia Evaluation  Patient identified by MRN, date of birth, ID band Patient awake    Reviewed: Allergy & Precautions, NPO status , Patient's Chart, lab work & pertinent test results  Airway Mallampati: II  TM Distance: >3 FB Neck ROM: Full    Dental  (+) Dental Advisory Given   Pulmonary former smoker   breath sounds clear to auscultation       Cardiovascular negative cardio ROS  Rhythm:Regular Rate:Normal     Neuro/Psych negative neurological ROS     GI/Hepatic negative GI ROS, Neg liver ROS,,,  Endo/Other  negative endocrine ROS    Renal/GU negative Renal ROS     Musculoskeletal  (+) Arthritis ,    Abdominal   Peds  Hematology negative hematology ROS (+)   Anesthesia Other Findings   Reproductive/Obstetrics                             Anesthesia Physical Anesthesia Plan  ASA: 2  Anesthesia Plan: General   Post-op Pain Management: Tylenol PO (pre-op)*   Induction: Intravenous  PONV Risk Score and Plan: 2 and Dexamethasone, Ondansetron and Treatment may vary due to age or medical condition  Airway Management Planned: Oral ETT  Additional Equipment: None  Intra-op Plan:   Post-operative Plan: Extubation in OR  Informed Consent: I have reviewed the patients History and Physical, chart, labs and discussed the procedure including the risks, benefits and alternatives for the proposed anesthesia with the patient or authorized representative who has indicated his/her understanding and acceptance.     Dental advisory given  Plan Discussed with: CRNA  Anesthesia Plan Comments:        Anesthesia Quick Evaluation

## 2022-07-22 NOTE — Progress Notes (Signed)
Anesthesia Chart Review:   Case: [id number] Date/Time: 07/27/22 0915   Procedure: Microdiscectomy - left C7-T1 (Left) - 3C   Anesthesia type: General   Pre-op diagnosis: Stenosis   Location: [facility] OR ROOM 18 / [facility] OR   Surgeons: Julio Sicks, MD       DISCUSSION: Pt is 52 years old with hx thrombocytopenia (2014, saw hematology, thought secondary to infection and medication)  40 pack year smoking hx. Quit 09/11/21  VS: BP 126/87   Pulse 67   Temp 36.7 C (Oral)   Resp 18   Ht 5\' 9"  (1.753 m)   Wt 78.7 kg   SpO2 100%   BMI 25.62 kg/m   PROVIDERS: - PCP is Milus Height, [place] - Saw cardiologist Chilton Si, MD in [phone] for chest  pain. CTA reassuring with calcium score of 0  LABS: Labs reviewed: Acceptable for surgery. (all labs ordered are listed, but only abnormal results are displayed)  Labs Reviewed  CBC - Abnormal; Notable for the following components:      Result Value   Platelets 134 (*)    All other components within normal limits  BASIC METABOLIC PANEL - Abnormal; Notable for the following components:   BUN 23 (*)    All other components within normal limits  SURGICAL PCR SCREEN     EKG 07/21/22: sinus bradycardia (54 bpm)   CV: Echo 11/02/17:  - Left ventricle: The cavity size was normal. Systolic function was mildly reduced. The estimated ejection fraction was approximately 50%. Although no diagnostic regional wall motion abnormality was identified, this possibility cannot be completely excluded on the basis of this study.  - Aortic valve: There was no significant regurgitation.  - Mitral valve: There was no significant regurgitation.  - Atrial septum: No defect or patent foramen ovale was identified.  - Tricuspid valve: There was no significant regurgitation.  - Pulmonic valve: There was no significant regurgitation.  - Impressions: Technically difficult study. Echo contrast used to better evaluated LV wall motion. While large wall motion defects were not  visualized, even with contrast the study is somewhat limited to assess focal wall motion abnormalities.   CT coronary morphology 03/26/16:  1. Coronary calcium score of 0. This was 0 percentile for age and sex matched control. 2. Normal coronary origin with right dominance. LAD and LCX have separate adjacent origins directly from the left coronary cusp. 3. No evidence of CAD. 4. There is mild dilatation of the pulmonary artery measuring 33 x 24 mm.   Past Medical History:  Diagnosis Date   Arthritis    Atypical chest pain 01/17/2016   Back pain    Herpes simplex    Hypogonadism male    Shortness of breath    feels that way all the time, ? pain or Percocet   Thrombocytopenia 2014   Tobacco abuse 05/18/2016    Past Surgical History:  Procedure Laterality Date   BACK SURGERY     lumbar fusion L3- 4, S 1   CERVICAL FUSION  2014   VASECTOMY      MEDICATIONS:  aspirin EC 81 MG tablet   busPIRone (BUSPAR) 10 MG tablet   gabapentin (NEURONTIN) 300 MG capsule   No current facility-administered medications for this encounter.    If no changes, I anticipate pt can proceed with surgery as scheduled.   Rica Mast, PhD, FNP-BC [facility] Short Stay Surgical Center/Anesthesiology Phone: [phone] 07/22/2022 12:34 PM

## 2022-07-27 ENCOUNTER — Encounter (HOSPITAL_COMMUNITY)
Admission: RE | Disposition: A | Discharge status: Home / Self Care | Payer: Self-pay | Source: Home / Self Care | Attending: Neurosurgery

## 2022-07-27 ENCOUNTER — Ambulatory Visit (HOSPITAL_COMMUNITY): Payer: Medicaid Other

## 2022-07-27 ENCOUNTER — Other Ambulatory Visit: Payer: Self-pay

## 2022-07-27 ENCOUNTER — Encounter (HOSPITAL_COMMUNITY): Payer: Self-pay | Admitting: Neurosurgery

## 2022-07-27 ENCOUNTER — Ambulatory Visit (HOSPITAL_COMMUNITY): Payer: Medicaid Other | Admitting: Emergency Medicine

## 2022-07-27 ENCOUNTER — Observation Stay (HOSPITAL_COMMUNITY)
Admission: RE | Admit: 2022-07-27 | Discharge: 2022-07-28 | Disposition: A | Discharge status: Home / Self Care | Payer: Medicaid Other | Attending: Neurosurgery | Admitting: Neurosurgery

## 2022-07-27 ENCOUNTER — Ambulatory Visit (HOSPITAL_BASED_OUTPATIENT_CLINIC_OR_DEPARTMENT_OTHER): Payer: Medicaid Other | Admitting: Certified Registered Nurse Anesthetist

## 2022-07-27 DIAGNOSIS — M5023 Other cervical disc displacement, cervicothoracic region: Secondary | ICD-10-CM

## 2022-07-27 DIAGNOSIS — Z79899 Other long term (current) drug therapy: Secondary | ICD-10-CM | POA: Insufficient documentation

## 2022-07-27 DIAGNOSIS — Z7982 Long term (current) use of aspirin: Secondary | ICD-10-CM | POA: Diagnosis not present

## 2022-07-27 DIAGNOSIS — M5013 Cervical disc disorder with radiculopathy, cervicothoracic region: Secondary | ICD-10-CM | POA: Diagnosis present

## 2022-07-27 DIAGNOSIS — M4803 Spinal stenosis, cervicothoracic region: Secondary | ICD-10-CM | POA: Diagnosis not present

## 2022-07-27 DIAGNOSIS — Z87891 Personal history of nicotine dependence: Secondary | ICD-10-CM | POA: Insufficient documentation

## 2022-07-27 DIAGNOSIS — M5412 Radiculopathy, cervical region: Secondary | ICD-10-CM | POA: Diagnosis present

## 2022-07-27 HISTORY — PX: POSTERIOR CERVICAL LAMINECTOMY: SHX2248

## 2022-07-27 SURGERY — POSTERIOR CERVICAL LAMINECTOMY
Anesthesia: General | Site: Spine Cervical | Laterality: Left

## 2022-07-27 MED ORDER — LACTATED RINGERS IV SOLN: INTRAVENOUS | Status: DC

## 2022-07-27 MED ORDER — FENTANYL CITRATE (PF) 100 MCG/2ML IJ SOLN
25.0000 ug | INTRAMUSCULAR | Status: DC | PRN
  Administered 2022-07-27: 25 ug via INTRAVENOUS
  Administered 2022-07-27: 50 ug via INTRAVENOUS
  Administered 2022-07-27: 25 ug via INTRAVENOUS
  Administered 2022-07-27: 50 ug via INTRAVENOUS

## 2022-07-27 MED ORDER — ACETAMINOPHEN 650 MG RE SUPP: 650.0000 mg | RECTAL | Status: DC | PRN

## 2022-07-27 MED ORDER — CEFAZOLIN SODIUM-DEXTROSE 1-4 GM/50ML-% IV SOLN
1.0000 g | Freq: Three times a day (TID) | INTRAVENOUS | Status: AC
  Administered 2022-07-27 – 2022-07-28 (×2): 1 g via INTRAVENOUS
  Filled 2022-07-27 (×2): qty 50

## 2022-07-27 MED ORDER — CYCLOBENZAPRINE HCL 10 MG PO TABS
10.0000 mg | ORAL_TABLET | Freq: Three times a day (TID) | ORAL | Status: DC | PRN
  Administered 2022-07-27 – 2022-07-28 (×3): 10 mg via ORAL
  Filled 2022-07-27 (×2): qty 1

## 2022-07-27 MED ORDER — SODIUM CHLORIDE 0.9% FLUSH: 3.0000 mL | INTRAVENOUS | Status: DC | PRN

## 2022-07-27 MED ORDER — AMISULPRIDE (ANTIEMETIC) 5 MG/2ML IV SOLN: 10.0000 mg | Freq: Once | INTRAVENOUS | Status: DC | PRN

## 2022-07-27 MED ORDER — CEFAZOLIN SODIUM-DEXTROSE 2-4 GM/100ML-% IV SOLN
2.0000 g | INTRAVENOUS | Status: DC
  Filled 2022-07-27: qty 100

## 2022-07-27 MED ORDER — MIDAZOLAM HCL 2 MG/2ML IJ SOLN
INTRAMUSCULAR | Status: DC | PRN
  Administered 2022-07-27: 2 mg via INTRAVENOUS

## 2022-07-27 MED ORDER — MIDAZOLAM HCL 2 MG/2ML IJ SOLN
INTRAMUSCULAR | Status: AC
  Filled 2022-07-27: qty 2

## 2022-07-27 MED ORDER — GABAPENTIN 300 MG PO CAPS
300.0000 mg | ORAL_CAPSULE | Freq: Three times a day (TID) | ORAL | Status: DC
  Administered 2022-07-27 – 2022-07-28 (×3): 300 mg via ORAL
  Filled 2022-07-27 (×3): qty 1

## 2022-07-27 MED ORDER — ORAL CARE MOUTH RINSE: 15.0000 mL | Freq: Once | OROMUCOSAL | Status: AC

## 2022-07-27 MED ORDER — SODIUM CHLORIDE 0.9 % IV SOLN
250.0000 mL | INTRAVENOUS | Status: DC
  Administered 2022-07-27: 250 mL via INTRAVENOUS

## 2022-07-27 MED ORDER — CHLORHEXIDINE GLUCONATE 0.12 % MT SOLN: 15.0000 mL | Freq: Once | OROMUCOSAL | Status: AC

## 2022-07-27 MED ORDER — HYDROCODONE-ACETAMINOPHEN 5-325 MG PO TABS: 1.0000 | ORAL_TABLET | ORAL | Status: DC | PRN

## 2022-07-27 MED ORDER — THROMBIN 20000 UNITS EX SOLR
CUTANEOUS | Status: DC | PRN
  Administered 2022-07-27: 20 mL via TOPICAL

## 2022-07-27 MED ORDER — HYDROCODONE-ACETAMINOPHEN 5-325 MG PO TABS
ORAL_TABLET | ORAL | Status: AC
  Administered 2022-07-27: 2
  Filled 2022-07-27: qty 2

## 2022-07-27 MED ORDER — THROMBIN 20000 UNITS EX SOLR
CUTANEOUS | Status: AC
  Filled 2022-07-27: qty 20000

## 2022-07-27 MED ORDER — CHLORHEXIDINE GLUCONATE 0.12 % MT SOLN
OROMUCOSAL | Status: AC
  Administered 2022-07-27: 15 mL via OROMUCOSAL
  Filled 2022-07-27: qty 15

## 2022-07-27 MED ORDER — ROCURONIUM BROMIDE 10 MG/ML (PF) SYRINGE
PREFILLED_SYRINGE | INTRAVENOUS | Status: DC | PRN
  Administered 2022-07-27 (×2): 40 mg via INTRAVENOUS

## 2022-07-27 MED ORDER — MENTHOL 3 MG MT LOZG: 1.0000 | LOZENGE | OROMUCOSAL | Status: DC | PRN

## 2022-07-27 MED ORDER — KETOROLAC TROMETHAMINE 30 MG/ML IJ SOLN
INTRAMUSCULAR | Status: DC | PRN
  Administered 2022-07-27: 30 mg via INTRAVENOUS

## 2022-07-27 MED ORDER — ACETAMINOPHEN 500 MG PO TABS
1000.0000 mg | ORAL_TABLET | Freq: Once | ORAL | Status: AC
  Administered 2022-07-27: 1000 mg via ORAL
  Filled 2022-07-27: qty 2

## 2022-07-27 MED ORDER — HYDROCODONE-ACETAMINOPHEN 10-325 MG PO TABS
2.0000 | ORAL_TABLET | ORAL | Status: DC | PRN
  Administered 2022-07-27 – 2022-07-28 (×5): 2 via ORAL
  Filled 2022-07-27 (×4): qty 2

## 2022-07-27 MED ORDER — PHENYLEPHRINE HCL-NACL 20-0.9 MG/250ML-% IV SOLN
INTRAVENOUS | Status: DC | PRN
  Administered 2022-07-27: 25 ug/min via INTRAVENOUS

## 2022-07-27 MED ORDER — FENTANYL CITRATE (PF) 250 MCG/5ML IJ SOLN
INTRAMUSCULAR | Status: AC
  Filled 2022-07-27: qty 5

## 2022-07-27 MED ORDER — DEXAMETHASONE SODIUM PHOSPHATE 10 MG/ML IJ SOLN
INTRAMUSCULAR | Status: DC | PRN
  Administered 2022-07-27: 10 mg via INTRAVENOUS

## 2022-07-27 MED ORDER — KETOROLAC TROMETHAMINE 15 MG/ML IJ SOLN
30.0000 mg | Freq: Four times a day (QID) | INTRAMUSCULAR | Status: AC
  Administered 2022-07-27 – 2022-07-28 (×4): 30 mg via INTRAVENOUS
  Filled 2022-07-27 (×4): qty 2

## 2022-07-27 MED ORDER — CHLORHEXIDINE GLUCONATE CLOTH 2 % EX PADS: 6.0000 | MEDICATED_PAD | Freq: Once | CUTANEOUS | Status: DC

## 2022-07-27 MED ORDER — PROPOFOL 10 MG/ML IV BOLUS
INTRAVENOUS | Status: AC
  Filled 2022-07-27: qty 20

## 2022-07-27 MED ORDER — ONDANSETRON HCL 4 MG PO TABS: 4.0000 mg | ORAL_TABLET | Freq: Four times a day (QID) | ORAL | Status: DC | PRN

## 2022-07-27 MED ORDER — ONDANSETRON HCL 4 MG/2ML IJ SOLN: 4.0000 mg | Freq: Four times a day (QID) | INTRAMUSCULAR | Status: DC | PRN

## 2022-07-27 MED ORDER — FENTANYL CITRATE (PF) 100 MCG/2ML IJ SOLN
INTRAMUSCULAR | Status: AC
  Filled 2022-07-27: qty 2

## 2022-07-27 MED ORDER — ONDANSETRON HCL 4 MG/2ML IJ SOLN
INTRAMUSCULAR | Status: AC
  Filled 2022-07-27: qty 2

## 2022-07-27 MED ORDER — THROMBIN 5000 UNITS EX SOLR
OROMUCOSAL | Status: DC | PRN
  Administered 2022-07-27: 5 mL via TOPICAL

## 2022-07-27 MED ORDER — LIDOCAINE 2% (20 MG/ML) 5 ML SYRINGE
INTRAMUSCULAR | Status: DC | PRN
  Administered 2022-07-27: 40 mg via INTRAVENOUS

## 2022-07-27 MED ORDER — CYCLOBENZAPRINE HCL 10 MG PO TABS
ORAL_TABLET | ORAL | Status: AC
  Filled 2022-07-27: qty 1

## 2022-07-27 MED ORDER — HYDROMORPHONE HCL 1 MG/ML IJ SOLN: 1.0000 mg | INTRAMUSCULAR | Status: DC | PRN

## 2022-07-27 MED ORDER — DEXAMETHASONE SODIUM PHOSPHATE 10 MG/ML IJ SOLN
INTRAMUSCULAR | Status: AC
  Filled 2022-07-27: qty 1

## 2022-07-27 MED ORDER — SUGAMMADEX SODIUM 200 MG/2ML IV SOLN
INTRAVENOUS | Status: DC | PRN
  Administered 2022-07-27: 200 mg via INTRAVENOUS

## 2022-07-27 MED ORDER — LIDOCAINE 2% (20 MG/ML) 5 ML SYRINGE
INTRAMUSCULAR | Status: AC
  Filled 2022-07-27: qty 5

## 2022-07-27 MED ORDER — THROMBIN 5000 UNITS EX SOLR
CUTANEOUS | Status: AC
  Filled 2022-07-27: qty 5000

## 2022-07-27 MED ORDER — CEFAZOLIN SODIUM-DEXTROSE 2-3 GM-%(50ML) IV SOLR
INTRAVENOUS | Status: DC | PRN
  Administered 2022-07-27: 2 g via INTRAVENOUS

## 2022-07-27 MED ORDER — FENTANYL CITRATE (PF) 250 MCG/5ML IJ SOLN
INTRAMUSCULAR | Status: DC | PRN
  Administered 2022-07-27: 100 ug via INTRAVENOUS

## 2022-07-27 MED ORDER — ONDANSETRON HCL 4 MG/2ML IJ SOLN
INTRAMUSCULAR | Status: DC | PRN
  Administered 2022-07-27: 4 mg via INTRAVENOUS

## 2022-07-27 MED ORDER — PROPOFOL 10 MG/ML IV BOLUS
INTRAVENOUS | Status: DC | PRN
  Administered 2022-07-27: 50 mg via INTRAVENOUS
  Administered 2022-07-27: 150 mg via INTRAVENOUS

## 2022-07-27 MED ORDER — BACITRACIN ZINC 500 UNIT/GM EX OINT
TOPICAL_OINTMENT | CUTANEOUS | Status: DC | PRN
  Administered 2022-07-27: 1 via TOPICAL

## 2022-07-27 MED ORDER — BUSPIRONE HCL 10 MG PO TABS: 10.0000 mg | ORAL_TABLET | Freq: Every day | ORAL | Status: DC | PRN

## 2022-07-27 MED ORDER — BACITRACIN ZINC 500 UNIT/GM EX OINT
TOPICAL_OINTMENT | CUTANEOUS | Status: AC
  Filled 2022-07-27: qty 28.35

## 2022-07-27 MED ORDER — PHENOL 1.4 % MT LIQD: 1.0000 | OROMUCOSAL | Status: DC | PRN

## 2022-07-27 MED ORDER — 0.9 % SODIUM CHLORIDE (POUR BTL) OPTIME
TOPICAL | Status: DC | PRN
  Administered 2022-07-27: 1000 mL

## 2022-07-27 MED ORDER — LACTATED RINGERS IV SOLN: INTRAVENOUS | Status: DC | PRN

## 2022-07-27 MED ORDER — BUPIVACAINE HCL (PF) 0.25 % IJ SOLN
INTRAMUSCULAR | Status: DC | PRN
  Administered 2022-07-27: 20 mL

## 2022-07-27 MED ORDER — ACETAMINOPHEN 325 MG PO TABS: 650.0000 mg | ORAL_TABLET | ORAL | Status: DC | PRN

## 2022-07-27 MED ORDER — PHENYLEPHRINE 80 MCG/ML (10ML) SYRINGE FOR IV PUSH (FOR BLOOD PRESSURE SUPPORT)
PREFILLED_SYRINGE | INTRAVENOUS | Status: AC
  Filled 2022-07-27: qty 10

## 2022-07-27 MED ORDER — SODIUM CHLORIDE 0.9% FLUSH
3.0000 mL | Freq: Two times a day (BID) | INTRAVENOUS | Status: DC
  Administered 2022-07-27 (×2): 3 mL via INTRAVENOUS

## 2022-07-27 MED ORDER — EPHEDRINE 5 MG/ML INJ
INTRAVENOUS | Status: AC
  Filled 2022-07-27: qty 5

## 2022-07-27 MED ORDER — BUPIVACAINE HCL (PF) 0.25 % IJ SOLN
INTRAMUSCULAR | Status: AC
  Filled 2022-07-27: qty 30

## 2022-07-27 SURGICAL SUPPLY — 51 items
ADH SKN CLS APL DERMABOND .7 (GAUZE/BANDAGES/DRESSINGS) ×1
APL SKNCLS STERI-STRIP NONHPOA (GAUZE/BANDAGES/DRESSINGS) ×1
BAG COUNTER SPONGE SURGICOUNT (BAG) ×1 IMPLANT
BAG DECANTER FOR FLEXI CONT (MISCELLANEOUS) ×1 IMPLANT
BAG SPNG CNTER NS LX DISP (BAG) ×1
BAND INSRT 18 STRL LF DISP RB (MISCELLANEOUS)
BAND RUBBER #18 3X1/16 STRL (MISCELLANEOUS) IMPLANT
BENZOIN TINCTURE PRP APPL 2/3 (GAUZE/BANDAGES/DRESSINGS) ×1 IMPLANT
BLADE CLIPPER SURG (BLADE) IMPLANT
BUR MATCHSTICK NEURO 3.0 LAGG (BURR) ×1 IMPLANT
CANISTER SUCT 3000ML PPV (MISCELLANEOUS) ×1 IMPLANT
DERMABOND ADVANCED .7 DNX12 (GAUZE/BANDAGES/DRESSINGS) ×1 IMPLANT
DRAPE LAPAROTOMY 100X72 PEDS (DRAPES) ×1 IMPLANT
DRAPE MICROSCOPE SLANT 54X150 (MISCELLANEOUS) IMPLANT
DRSG OPSITE POSTOP 4X6 (GAUZE/BANDAGES/DRESSINGS) IMPLANT
DURAPREP 26ML APPLICATOR (WOUND CARE) ×1 IMPLANT
ELECT REM PT RETURN 9FT ADLT (ELECTROSURGICAL) ×1
ELECTRODE REM PT RTRN 9FT ADLT (ELECTROSURGICAL) ×1 IMPLANT
GAUZE 4X4 16PLY ~~LOC~~+RFID DBL (SPONGE) IMPLANT
GAUZE SPONGE 4X4 12PLY STRL (GAUZE/BANDAGES/DRESSINGS) ×1 IMPLANT
GLOVE ECLIPSE 9.0 STRL (GLOVE) ×1 IMPLANT
GLOVE EXAM NITRILE XL STR (GLOVE) IMPLANT
GOWN STRL REUS W/ TWL LRG LVL3 (GOWN DISPOSABLE) IMPLANT
GOWN STRL REUS W/ TWL XL LVL3 (GOWN DISPOSABLE) ×1 IMPLANT
GOWN STRL REUS W/TWL 2XL LVL3 (GOWN DISPOSABLE) IMPLANT
GOWN STRL REUS W/TWL LRG LVL3 (GOWN DISPOSABLE) ×2
GOWN STRL REUS W/TWL XL LVL3 (GOWN DISPOSABLE) ×1
HEMOSTAT POWDER KIT SURGIFOAM (HEMOSTASIS) IMPLANT
KIT BASIN OR (CUSTOM PROCEDURE TRAY) ×1 IMPLANT
KIT TURNOVER KIT B (KITS) ×1 IMPLANT
NDL SPNL 22GX3.5 QUINCKE BK (NEEDLE) ×1 IMPLANT
NEEDLE HYPO 22GX1.5 SAFETY (NEEDLE) ×1 IMPLANT
NEEDLE SPNL 22GX3.5 QUINCKE BK (NEEDLE) ×1 IMPLANT
NS IRRIG 1000ML POUR BTL (IV SOLUTION) ×1 IMPLANT
PACK LAMINECTOMY NEURO (CUSTOM PROCEDURE TRAY) ×1 IMPLANT
PAD ARMBOARD 7.5X6 YLW CONV (MISCELLANEOUS) ×3 IMPLANT
PIN MAYFIELD SKULL DISP (PIN) ×1 IMPLANT
SOL ELECTROSURG ANTI STICK (MISCELLANEOUS)
SOLUTION ELECTROSURG ANTI STCK (MISCELLANEOUS) ×1 IMPLANT
SPONGE SURGIFOAM ABS GEL 100 (HEMOSTASIS) IMPLANT
SPONGE SURGIFOAM ABS GEL SZ50 (HEMOSTASIS) ×1 IMPLANT
SPONGE T-LAP 4X18 ~~LOC~~+RFID (SPONGE) IMPLANT
STRIP CLOSURE SKIN 1/2X4 (GAUZE/BANDAGES/DRESSINGS) ×1 IMPLANT
STRIP SURGICAL 1/4 X 6 IN (GAUZE/BANDAGES/DRESSINGS) IMPLANT
SUT VIC AB 0 CT1 18XCR BRD8 (SUTURE) ×1 IMPLANT
SUT VIC AB 0 CT1 8-18 (SUTURE) ×1
SUT VIC AB 2-0 CT1 18 (SUTURE) ×1 IMPLANT
SUT VIC AB 3-0 SH 8-18 (SUTURE) ×1 IMPLANT
TOWEL GREEN STERILE (TOWEL DISPOSABLE) ×1 IMPLANT
TOWEL GREEN STERILE FF (TOWEL DISPOSABLE) ×1 IMPLANT
WATER STERILE IRR 1000ML POUR (IV SOLUTION) ×1 IMPLANT

## 2022-07-27 NOTE — Transfer of Care (Signed)
Immediate Anesthesia Transfer of Care Note  Patient: Dillon Macdonald  Procedure(s) Performed: Microdiscectomy - left Cervical seven-Thoracic one (Left: Spine Cervical)  Patient Location: PACU  Anesthesia Type:General  Level of Consciousness: drowsy and patient cooperative  Airway & Oxygen Therapy: Patient Spontanous Breathing and Patient connected to face mask oxygen  Post-op Assessment: Report given to RN, Post -op Vital signs reviewed and stable, and Patient moving all extremities X 4  Post vital signs: Reviewed and stable  Last Vitals:  Vitals Value Taken Time  BP 127/90 07/27/22 1145  Temp    Pulse 74 07/27/22 1146  Resp 16 07/27/22 1146  SpO2 99 % 07/27/22 1146  Vitals shown include unvalidated device data.  Last Pain:  Vitals:   07/27/22 0752  TempSrc:   PainSc: 5          Complications: No notable events documented.

## 2022-07-27 NOTE — Anesthesia Procedure Notes (Signed)
Procedure Name: Intubation Date/Time: 07/27/2022 9:52 AM  Performed by: Alvera Novel, CRNAPre-anesthesia Checklist: Patient identified, Emergency Drugs available, Suction available and Patient being monitored Patient Re-evaluated:Patient Re-evaluated prior to induction Oxygen Delivery Method: Circle System Utilized Preoxygenation: Pre-oxygenation with 100% oxygen Induction Type: IV induction Ventilation: Mask ventilation without difficulty Laryngoscope Size: Glidescope and 4 Grade View: Grade I Tube type: Oral Tube size: 7.0 mm Number of attempts: 1 Airway Equipment and Method: Stylet Placement Confirmation: ETT inserted through vocal cords under direct vision, positive ETCO2 and breath sounds checked- equal and bilateral Secured at: 22 cm Tube secured with: Tape Dental Injury: Teeth and Oropharynx as per pre-operative assessment  Difficulty Due To: Difficult Airway- due to reduced neck mobility

## 2022-07-27 NOTE — H&P (Signed)
  Dillon Macdonald is an 52 y.o. male.   Chief Complaint: Numbness HPI: 52 year old male remotely status post C6-7 anterior cervical fusion.  Patient presents now with left upper extremity numbness paresthesias and weakness consistent with a left-sided C8 radiculopathy which is failed conservative management.  Workup demonstrates evidence of a moderately large left-sided C7-T1 disc herniation with compression of the left C8 nerve root.  Patient presents now for laminotomy and foraminotomies at C7 T1 in hopes of improving his symptoms.  Past Medical History:  Diagnosis Date   Arthritis    Atypical chest pain 01/17/2016   Back pain    Herpes simplex    Hypogonadism male    Shortness of breath    feels that way all the time, ? pain or Percocet   Thrombocytopenia 2014   Tobacco abuse 05/18/2016    Past Surgical History:  Procedure Laterality Date   BACK SURGERY     lumbar fusion L3- 4, S 1   CERVICAL FUSION  2014   VASECTOMY      Family History  Problem Relation Age of Onset   Hyperthyroidism Mother    Stroke Father    Heart attack Brother    Heart attack Paternal Uncle    Heart attack Brother    Hypertension Brother    Other Neg Hx        low testosterone   Social History:  reports that he quit smoking about 10 months ago. His smoking use included cigarettes. He has a 40.00 pack-year smoking history. He has never used smokeless tobacco. He reports that he does not drink alcohol and does not use drugs.  Allergies:  Allergies  Allergen Reactions   Sildenafil Other (See Comments)    Blurry vision    Medications Prior to Admission  Medication Sig Dispense Refill   aspirin EC 81 MG tablet Take 1 tablet (81 mg total) by mouth daily. 90 tablet 3   busPIRone (BUSPAR) 10 MG tablet Take 10 mg by mouth daily as needed (Anxiety).     gabapentin (NEURONTIN) 300 MG capsule Take 300 mg by mouth 3 (three) times daily.      No results found for this or any previous visit (from the past  48 hour(s)). No results found.  Pertinent items noted in HPI and remainder of comprehensive ROS otherwise negative.  Blood pressure 118/83, pulse 72, temperature 97.6 F (36.4 C), temperature source Oral, resp. rate 16, height 5\' 9"  (1.753 m), weight 77.1 kg, SpO2 98 %.  Patient is awake and alert.  He is oriented and appropriate.  Speech is fluent.  Judgment insight are intact.  Cranial nerve function normal bilateral.  Motor examination reveals weakness of his left-sided intrinsics and grip grading at 4-/5.  Sensory examination with decrease sensation.  Light touch in his left C8 dermatome.  Deep tendon rhexis normal active.  No evidence of long track signs.  Gait and posture normal.  Examination head ears eyes nose and throat is unremarked.  Chest and abdomen are benign.  Extremities are free from injury or deformity. 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  Plan left C7-T1 laminotomy foraminotomies with microdiscectomy.  Risks and benefits been explained.  Patient wishes to proceed.  Kathaleen Maser Ryleigh Esqueda 07/27/2022, 9:31 AM

## 2022-07-27 NOTE — Op Note (Signed)
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  Asst. Surgeon: Doran Durand, NP  Assistant utilized for exposure, decompression and closure.  Anesthesia: General  Indication: 52 year old male status post remote C6-7 anterior cervical discectomy and fusion presents with left upper extremity radicular pain numbness and weakness consistent with a left-sided C8 radiculopathy.  Workup demonstrates evidence of a focal left-sided C7-T1 disc herniation with marked compression of his left C8 nerve root.  Patient presents now for laminotomy foraminotomy and microdiscectomy in hopes improving his symptoms.  Operative note: After induction of anesthesia, patient positioned prone onto bolsters with his head fixed in the [place] pin headrest in a neutral position.  Patient's posterior cervical region prepped and draped sterilely.  Incision made overlying C7 and T1.  Dissection performed on the left.  Retractor placed.  X-ray was taken and eventually the C7-T1 level was identified.  Laminotomy was then performed using high-speed drill and Kerrison rongeurs to remove the inferior aspect of the lamina of C7 and the superior aspect of the lamina of T1 and the medial aspect of the C7 and T1 facet joint on the left.  Ligament flavum elevated and resected.  Underlying thecal sac and left C8 nerve root were identified.  Microscope brought in field used microsuction of the spinal canal.  Foraminotomies completed along the course of the exiting C8 nerve root.  The pedicle was abutting the C8 nerve root and the superior aspect the pedicle was then removed using high-speed drill.  Perineural venous plexus was coagulated and cut.  Thecal sac and left C8 nerve root were gently  mobilized.  A free fragment of disc herniation was dissected free and removed.  The disc space itself was bulging but not overtly torn or ruptured more.  I do not see any value in the cutting into the disc space proper.  Wound was then irrigated.  Gelfoam was placed topically for hemostasis.  Wound is then closed in layers with Vicryl sutures.  Steri-Strips and sterile dressing were applied.  No apparent complications.  Patient tolerated the procedure well and he returns to the recovery room postop.

## 2022-07-27 NOTE — Anesthesia Postprocedure Evaluation (Signed)
Anesthesia Post Note  Patient: Dillon Macdonald  Procedure(s) Performed: Microdiscectomy - left Cervical seven-Thoracic one (Left: Spine Cervical)     Patient location during evaluation: PACU Anesthesia Type: General Level of consciousness: awake and alert Pain management: pain level controlled Vital Signs Assessment: post-procedure vital signs reviewed and stable Respiratory status: spontaneous breathing, nonlabored ventilation, respiratory function stable and patient connected to nasal cannula oxygen Cardiovascular status: blood pressure returned to baseline and stable Postop Assessment: no apparent nausea or vomiting Anesthetic complications: no  No notable events documented.  Last Vitals:  Vitals:   07/27/22 1215 07/27/22 1254  BP: 127/87 (!) 137/93  Pulse: 77 69  Resp: 12 20  Temp: 36.4 C 36.6 C  SpO2: 95% 99%    Last Pain:  Vitals:   07/27/22 1254  TempSrc: Oral  PainSc:                  Kennieth Rad

## 2022-07-27 NOTE — Brief Op Note (Signed)
07/27/2022  11:24 AM  PATIENT:  Dillon Macdonald  52 y.o. male  PRE-OPERATIVE DIAGNOSIS:  Stenosis  POST-OPERATIVE DIAGNOSIS:  Stenosis  PROCEDURE:  Procedure(s): Microdiscectomy - left Cervical seven-Thoracic one (Left)  SURGEON:  Surgeon(s) and Role:    Julio Sicks, MD - Primary  PHYSICIAN ASSISTANT:   ASSISTANTSMarland Mcalpine   ANESTHESIA:   general  EBL:  100 mL   BLOOD ADMINISTERED:none  DRAINS: none   LOCAL MEDICATIONS USED:  MARCAINE     SPECIMEN:  No Specimen  DISPOSITION OF SPECIMEN:  N/A  COUNTS:  YES  TOURNIQUET:  * No tourniquets in log *  DICTATION: .Dragon Dictation  PLAN OF CARE: Admit for overnight observation  PATIENT DISPOSITION:  PACU - hemodynamically stable.   Delay start of Pharmacological VTE agent (>24hrs) due to surgical blood loss or risk of bleeding: yes

## 2022-07-27 NOTE — Discharge Instructions (Signed)
Wound Care Keep incision covered and dry until post op day 3. You may remove the Honeycomb dressing on post op day 3. Leave steri-strips on back.  They will fall off by themselves. Do not put any creams, lotions, or ointments on incision. You are fine to shower. Let water run over incision and pat dry.  Activity Walk each and every day, increasing distance each day. No lifting greater than 5 lbs.  Avoid excessive neck motion. No driving for 2 weeks; may ride as a passenger locally.  Diet Resume your normal diet.   Return to Work Will be discussed at your follow up appointment.  Call Your Doctor If Any of These Occur Redness, drainage, or swelling at the wound.  Temperature greater than 101 degrees. Severe pain not relieved by pain medication. Incision starts to come apart.  Follow Up Appt Call [phone] today for appointment in 2 weeks if you don't already have one or for any problems.

## 2022-07-28 ENCOUNTER — Encounter (HOSPITAL_COMMUNITY): Payer: Self-pay | Admitting: Neurosurgery

## 2022-07-28 MED ORDER — GABAPENTIN 100 MG PO CAPS: 100.0000 mg | ORAL_CAPSULE | Freq: Three times a day (TID) | ORAL | 2 refills | Status: AC

## 2022-07-28 MED ORDER — CYCLOBENZAPRINE HCL 10 MG PO TABS: 10.0000 mg | ORAL_TABLET | Freq: Three times a day (TID) | ORAL | 0 refills | Status: AC | PRN

## 2022-07-28 MED ORDER — HYDROCODONE-ACETAMINOPHEN 5-325 MG PO TABS: 1.0000 | ORAL_TABLET | ORAL | 0 refills | Status: AC | PRN

## 2022-07-28 NOTE — Discharge Summary (Signed)
Physician Discharge Summary     Providing Compassionate, Quality Care - Together   Patient ID: Dillon Macdonald MRN: [id number] DOB/AGE: [birth year] 52 y.o.  Admit date: 07/27/2022 Discharge date: 07/28/2022  Admission Diagnoses: Cervical radiculopathy  Discharge Diagnoses:  Principal Problem:   Cervical radiculopathy   Discharged Condition: good  Hospital Course: Patient underwent a left C7 and T1 laminotomies and foraminotomies with microdiscectomy by Dr. Jordan Likes on 07/27/2022. He was admitted to 3C02  following recovery from anesthesia in the PACU. His postoperative course has been uncomplicated. He has worked with both occupational therapy who feels the patient is ready for discharge home. He is ambulating independently and without difficulty. He is tolerating a normal diet. He is not having any bowel or bladder dysfunction. His pain is well-controlled with oral pain medication. He is ready for discharge home.   Consults: None  Significant Diagnostic Studies: radiology: DG Cervical Spine 2 or 3 views  Result Date: 07/27/2022 CLINICAL DATA:  Micro discectomy.  Operative imaging. EXAM: CERVICAL SPINE - 2-3 VIEW COMPARISON:  05/28/2022. FINDINGS: Lateral portable imaging of the cervical spine. Images demonstrate placement posterior retractors, posterior to the upper thoracic spine, that is not well visualized due to overlying soft tissues. Stable appearing anterior fusion plate, fixation screws and radiolucent disc spacer lies at the C6-C7 level. IMPRESSION: Portable lateral cervical spine imaging for micro discectomy. Electronically Signed   By: Amie Portland M.D.   On: 07/27/2022 15:45     Treatments: surgery:  Left C7 and T1 laminotomy foraminotomies with microdiscectomy   Discharge Exam: Blood pressure 108/75, pulse 82, temperature 98.3 F (36.8 C), temperature source Oral, resp. rate 16, height  (1.753 m), weight 77.1 kg, SpO2 99 %.  Alert and oriented x 4 PERRLA CN II-XII  grossly intact MAE, Strength and sensation intact aside from left grip weakness with some numbness; left grip strength is 4-/5 Incision is covered with Honeycomb dressing and Steri Strips; Dressing is clean, dry, and intact   Disposition: Discharge disposition: 01-Home or Self Care       Discharge Instructions     Call MD for:  difficulty breathing, headache or visual disturbances   Complete by: As directed    Call MD for:  persistant nausea and vomiting   Complete by: As directed    Call MD for:  redness, tenderness, or signs of infection (pain, swelling, redness, odor or green/yellow discharge around incision site)   Complete by: As directed    Call MD for:  severe uncontrolled pain   Complete by: As directed    Call MD for:  temperature >100.4   Complete by: As directed    Diet - low sodium heart healthy   Complete by: As directed    If the dressing is still on your incision site when you go home, remove it on the third day after your surgery date. Remove dressing if it begins to fall off, or if it is dirty or damaged before the third day.   Complete by: As directed    Increase activity slowly   Complete by: As directed       Allergies as of 07/28/2022       Reactions   Lactose Intolerance (gi) Other (See Comments)   Sildenafil Other (See Comments)   Blurry vision        Medication List     TAKE these medications    aspirin EC 81 MG tablet Take 1 tablet (81 mg total) by mouth daily.  busPIRone 10 MG tablet Commonly known as: BUSPAR Take 10 mg by mouth daily as needed (Anxiety).   cyclobenzaprine 10 MG tablet Commonly known as: FLEXERIL Take 1 tablet (10 mg total) by mouth 3 (three) times daily as needed for muscle spasms.   gabapentin 100 MG capsule Commonly known as: Neurontin Take 1 capsule (100 mg total) by mouth 3 (three) times daily. What changed:  medication strength how much to take   HYDROcodone-acetaminophen 5-325 MG tablet Commonly known  as: NORCO/VICODIN Take 1-2 tablets by mouth every 4 (four) hours as needed for moderate pain ((score 4 to 6)).               Discharge Care Instructions  (From admission, onward)           Start     Ordered   07/28/22 0000  If the dressing is still on your incision site when you go home, remove it on the third day after your surgery date. Remove dressing if it begins to fall off, or if it is dirty or damaged before the third day.        07/28/22 1054            Follow-up Information     Julio Sicks, MD. Call.   Specialty: Neurosurgery Why: First post op appointment is on 08/06/2022 at 3:45 PM. Contact information: [address] [place] [address] [phone]                 Signed: Val Eagle, DNP, AGNP-C Nurse Practitioner  [facility] [address], [place], [place] [address] P: [phone]    F: [phone]  07/28/2022, 10:55 AM

## 2022-07-28 NOTE — Evaluation (Signed)
Occupational Therapy Evaluation Patient Details Name: Dillon Macdonald MRN: [id number] DOB: Jul 02, 1970 Today's Date: 07/28/2022   History of Present Illness Dillon Macdonald is an 52 y.o. male who is s/p microdiscectomy C7-T1. PMHx: arthritis, herpes simplex, tobacco use, lumbar and cervical fusions   Clinical Impression   Dillon Macdonald was evaluated s/p the above spine surgery. He lives alone, works and is indep at baseline. Upon evaluation he was limited by spinal precautions and decreased activity tolerance. Overall he was mod I for all mobility without AD and needed generalized superivsion A with cues for ADLs. Provided cues and education on spinal precautions and compensatory techniques throughout, handout provided and pt demonstrated good recall. Pt does not require further acute OT services. Recommend d/c home with support of family.        Recommendations for follow up therapy are one component of a multi-disciplinary discharge planning process, led by the attending physician.  Recommendations may be updated based on patient status, additional functional criteria and insurance authorization.   Assistance Recommended at Discharge PRN  Patient can return home with the following Assist for transportation;Assistance with cooking/housework    Functional Status Assessment  Patient has had a recent decline in their functional status and demonstrates the ability to make significant improvements in function in a reasonable and predictable amount of time.  Equipment Recommendations  None recommended by OT       Precautions / Restrictions Precautions Precautions: Fall;Cervical;Back Precaution Booklet Issued: Yes (comment) Restrictions Weight Bearing Restrictions: No      Mobility Bed Mobility Overal bed mobility: Needs Assistance Bed Mobility: Rolling, Sidelying to Sit Rolling: Modified independent (Device/Increase time) Sidelying to sit: Modified independent (Device/Increase time)             Transfers Overall transfer level: Needs assistance Equipment used: None Transfers: Sit to/from Stand Sit to Stand: Modified independent (Device/Increase time)                  Balance Overall balance assessment: Needs assistance Sitting-balance support: Feet supported Sitting balance-Leahy Scale: Good     Standing balance support: No upper extremity supported, Reliant on assistive device for balance Standing balance-Leahy Scale: Fair                             ADL either performed or assessed with clinical judgement   ADL Overall ADL's : Needs assistance/impaired                                       General ADL Comments: Generalized supervision A and cues needed to maintain spinal precautions during ADLs and functional mobility     Vision Baseline Vision/History: 0 No visual deficits Vision Assessment?: No apparent visual deficits     Perception Perception Perception Tested?: No   Praxis Praxis Praxis tested?: Within functional limits    Pertinent Vitals/Pain Pain Assessment Pain Assessment: No/denies pain     Hand Dominance Right   Extremity/Trunk Assessment Upper Extremity Assessment Upper Extremity Assessment: Generalized weakness (pt reports he is weaker than normal, but WFL for tasks)   Lower Extremity Assessment Lower Extremity Assessment: Overall WFL for tasks assessed   Cervical / Trunk Assessment Cervical / Trunk Assessment: Back Surgery;Neck Surgery   Communication     Cognition Arousal/Alertness: Awake/alert Behavior During Therapy: WFL for tasks assessed/performed Overall Cognitive Status: Within Functional Limits for tasks  assessed           General Comments: requires cues to maintain spinal precautions     General Comments  VSS on RA     Home Living Family/patient expects to be discharged to:: Private residence Living Arrangements: Spouse/significant other Available Help at Discharge:  Neighbor;Family;Available PRN/intermittently Type of Home: House Home Access: Stairs to enter [employer] of Steps: 3 Entrance Stairs-Rails: Can reach both Home Layout: Able to live on main level with bedroom/bathroom;Laundry or work area in basement     [employer] Shower/Tub: Chief Strategy Officer: Handicapped height     Home Equipment: Rollator (4 wheels);Cane - single point;Grab bars - tub/shower;Wheelchair - manual          Prior Functioning/Environment Prior Level of Function : Independent/Modified Independent;Driving;Working/employed             Mobility Comments: indep ADLs Comments: works, indep.        OT Problem List: Decreased strength;Decreased range of motion;Decreased activity tolerance;Decreased knowledge of precautions      OT Treatment/Interventions: Self-care/ADL training;Therapeutic exercise;DME and/or AE instruction;Therapeutic activities;Patient/family education;Balance training    OT Goals(Current goals can be found in the care plan section) Acute Rehab OT Goals Patient Stated Goal: home OT Goal Formulation: With patient Time For Goal Achievement: 08/11/22 Potential to Achieve Goals: Good  OT Frequency: Min 2X/week       AM-PAC OT "6 Clicks" Daily Activity     Outcome Measure Help from another person eating meals?: None Help from another person taking care of personal grooming?: A Little Help from another person toileting, which includes using toliet, bedpan, or urinal?: A Little Help from another person bathing (including washing, rinsing, drying)?: A Little Help from another person to put on and taking off regular upper body clothing?: None Help from another person to put on and taking off regular lower body clothing?: A Little 6 Click Score: 20   End of Session Nurse Communication: Mobility status  Activity Tolerance: Patient tolerated treatment well Patient left: in bed;with call bell/phone within reach  OT  Visit Diagnosis: Unsteadiness on feet (R26.81);Other abnormalities of gait and mobility (R26.89);Muscle weakness (generalized) (M62.81);Pain                Time: 4098-1191 OT Time Calculation (min): 16 min Charges:  OT General Charges $OT Visit: 1 Visit OT Evaluation $OT Eval Low Complexity: 1 Low  Derenda Mis, OTR/L Acute Rehabilitation Services Office [phone] Secure Chat Communication Preferred   Donia Pounds 07/28/2022, 10:05 AM

## 2022-07-29 ENCOUNTER — Inpatient Hospital Stay (HOSPITAL_COMMUNITY): Payer: Medicaid Other | Admitting: Certified Registered Nurse Anesthetist

## 2022-07-29 ENCOUNTER — Encounter (HOSPITAL_COMMUNITY)
Admission: EM | Disposition: A | Discharge status: Home / Self Care | Payer: Self-pay | Source: Home / Self Care | Attending: Internal Medicine

## 2022-07-29 ENCOUNTER — Inpatient Hospital Stay (HOSPITAL_COMMUNITY): Payer: Medicaid Other

## 2022-07-29 ENCOUNTER — Other Ambulatory Visit: Payer: Self-pay

## 2022-07-29 ENCOUNTER — Encounter (HOSPITAL_COMMUNITY): Payer: Self-pay

## 2022-07-29 ENCOUNTER — Inpatient Hospital Stay (HOSPITAL_COMMUNITY)
Admission: EM | Admit: 2022-07-29 | Discharge: 2022-08-04 | DRG: 377 | Disposition: A | Discharge status: Home / Self Care | Payer: Medicaid Other | Attending: Internal Medicine | Admitting: Internal Medicine

## 2022-07-29 DIAGNOSIS — K254 Chronic or unspecified gastric ulcer with hemorrhage: Secondary | ICD-10-CM | POA: Diagnosis not present

## 2022-07-29 DIAGNOSIS — Z981 Arthrodesis status: Secondary | ICD-10-CM

## 2022-07-29 DIAGNOSIS — Z888 Allergy status to other drugs, medicaments and biological substances status: Secondary | ICD-10-CM | POA: Diagnosis not present

## 2022-07-29 DIAGNOSIS — K922 Gastrointestinal hemorrhage, unspecified: Secondary | ICD-10-CM | POA: Diagnosis present

## 2022-07-29 DIAGNOSIS — G43909 Migraine, unspecified, not intractable, without status migrainosus: Secondary | ICD-10-CM | POA: Diagnosis not present

## 2022-07-29 DIAGNOSIS — Z8249 Family history of ischemic heart disease and other diseases of the circulatory system: Secondary | ICD-10-CM

## 2022-07-29 DIAGNOSIS — R451 Restlessness and agitation: Secondary | ICD-10-CM | POA: Diagnosis not present

## 2022-07-29 DIAGNOSIS — D62 Acute posthemorrhagic anemia: Secondary | ICD-10-CM | POA: Diagnosis not present

## 2022-07-29 DIAGNOSIS — R579 Shock, unspecified: Secondary | ICD-10-CM | POA: Diagnosis not present

## 2022-07-29 DIAGNOSIS — Z79899 Other long term (current) drug therapy: Secondary | ICD-10-CM

## 2022-07-29 DIAGNOSIS — K3182 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: Principal | ICD-10-CM | POA: Diagnosis present

## 2022-07-29 DIAGNOSIS — Z87891 Personal history of nicotine dependence: Secondary | ICD-10-CM

## 2022-07-29 DIAGNOSIS — E291 Testicular hypofunction: Secondary | ICD-10-CM | POA: Diagnosis present

## 2022-07-29 DIAGNOSIS — K920 Hematemesis: Secondary | ICD-10-CM

## 2022-07-29 DIAGNOSIS — R578 Other shock: Secondary | ICD-10-CM | POA: Diagnosis present

## 2022-07-29 DIAGNOSIS — D500 Iron deficiency anemia secondary to blood loss (chronic): Secondary | ICD-10-CM | POA: Diagnosis not present

## 2022-07-29 DIAGNOSIS — N139 Obstructive and reflux uropathy, unspecified: Secondary | ICD-10-CM | POA: Diagnosis not present

## 2022-07-29 DIAGNOSIS — Z7982 Long term (current) use of aspirin: Secondary | ICD-10-CM | POA: Diagnosis not present

## 2022-07-29 DIAGNOSIS — M542 Cervicalgia: Secondary | ICD-10-CM | POA: Diagnosis not present

## 2022-07-29 DIAGNOSIS — J9690 Respiratory failure, unspecified, unspecified whether with hypoxia or hypercapnia: Secondary | ICD-10-CM | POA: Diagnosis not present

## 2022-07-29 DIAGNOSIS — M1990 Unspecified osteoarthritis, unspecified site: Secondary | ICD-10-CM | POA: Diagnosis present

## 2022-07-29 DIAGNOSIS — E861 Hypovolemia: Secondary | ICD-10-CM | POA: Diagnosis present

## 2022-07-29 DIAGNOSIS — Z91011 Allergy to milk products: Secondary | ICD-10-CM

## 2022-07-29 DIAGNOSIS — B009 Herpesviral infection, unspecified: Secondary | ICD-10-CM | POA: Diagnosis present

## 2022-07-29 DIAGNOSIS — F419 Anxiety disorder, unspecified: Secondary | ICD-10-CM | POA: Diagnosis present

## 2022-07-29 DIAGNOSIS — K921 Melena: Secondary | ICD-10-CM | POA: Diagnosis not present

## 2022-07-29 HISTORY — PX: FOREIGN BODY REMOVAL: SHX962

## 2022-07-29 HISTORY — PX: ESOPHAGOGASTRODUODENOSCOPY: SHX5428

## 2022-07-29 HISTORY — DX: Gastrointestinal hemorrhage, unspecified: K92.2

## 2022-07-29 LAB — PREPARE PLATELET PHERESIS: Unit division: 0

## 2022-07-29 LAB — COMPREHENSIVE METABOLIC PANEL
ALT: 14 U/L (ref 0–44)
AST: 20 U/L (ref 15–41)
Albumin: 2.9 g/dL — ABNORMAL LOW (ref 3.5–5.0)
Alkaline Phosphatase: 34 U/L — ABNORMAL LOW (ref 38–126)
Anion gap: 7 (ref 5–15)
BUN: 44 mg/dL — ABNORMAL HIGH (ref 6–20)
CO2: 24 mmol/L (ref 22–32)
Calcium: 7.9 mg/dL — ABNORMAL LOW (ref 8.9–10.3)
Chloride: 106 mmol/L (ref 98–111)
Creatinine, Ser: 1.03 mg/dL (ref 0.61–1.24)
GFR, Estimated: >60 mL/min (ref >60)
Glucose, Bld: 169 mg/dL — ABNORMAL HIGH (ref 70–99)
Potassium: 4.2 mmol/L (ref 3.5–5.1)
Sodium: 137 mmol/L (ref 135–145)
Total Bilirubin: 0.4 mg/dL (ref 0.3–1.2)
Total Protein: 4.6 g/dL — ABNORMAL LOW (ref 6.5–8.1)

## 2022-07-29 LAB — CBC WITH DIFFERENTIAL/PLATELET
Abs Immature Granulocytes: 0.07 10*3/uL (ref 0.00–0.07)
Basophils Absolute: 0.1 10*3/uL (ref 0.0–0.1)
Basophils Relative: 1 %
Eosinophils Absolute: 0.1 10*3/uL (ref 0.0–0.5)
Eosinophils Relative: 1 %
HCT: 28 % — ABNORMAL LOW (ref 39.0–52.0)
Hemoglobin: 8.8 g/dL — ABNORMAL LOW (ref 13.0–17.0)
Immature Granulocytes: 1 %
Lymphocytes Relative: 19 %
Lymphs Abs: 1.9 10*3/uL (ref 0.7–4.0)
MCH: 29.8 pg (ref 26.0–34.0)
MCHC: 31.4 g/dL (ref 30.0–36.0)
MCV: 94.9 fL (ref 80.0–100.0)
Monocytes Absolute: 0.5 10*3/uL (ref 0.1–1.0)
Monocytes Relative: 6 %
Neutro Abs: 7.3 10*3/uL (ref 1.7–7.7)
Neutrophils Relative %: 72 %
Platelets: 135 10*3/uL — ABNORMAL LOW (ref 150–400)
RBC: 2.95 MIL/uL — ABNORMAL LOW (ref 4.22–5.81)
RDW: 14.1 % (ref 11.5–15.5)
WBC: 9.9 10*3/uL (ref 4.0–10.5)
nRBC: 0 % (ref 0.0–0.2)

## 2022-07-29 LAB — I-STAT CHEM 8, ED
BUN: 42 mg/dL — ABNORMAL HIGH (ref 6–20)
Calcium, Ion: 1.14 mmol/L — ABNORMAL LOW (ref 1.15–1.40)
Chloride: 103 mmol/L (ref 98–111)
Creatinine, Ser: 1 mg/dL (ref 0.61–1.24)
Glucose, Bld: 167 mg/dL — ABNORMAL HIGH (ref 70–99)
HCT: 25 % — ABNORMAL LOW (ref 39.0–52.0)
Hemoglobin: 8.5 g/dL — ABNORMAL LOW (ref 13.0–17.0)
Potassium: 4.3 mmol/L (ref 3.5–5.1)
Sodium: 138 mmol/L (ref 135–145)
TCO2: 25 mmol/L (ref 22–32)

## 2022-07-29 LAB — TYPE AND SCREEN
ABO/RH(D): A POS
Antibody Screen: NEGATIVE
Unit division: 0

## 2022-07-29 LAB — POCT I-STAT 7, (LYTES, BLD GAS, ICA,H+H)
Acid-Base Excess: 0 mmol/L (ref 0.0–2.0)
Bicarbonate: 25.9 mmol/L (ref 20.0–28.0)
Calcium, Ion: 1.24 mmol/L (ref 1.15–1.40)
HCT: 23 % — ABNORMAL LOW (ref 39.0–52.0)
Hemoglobin: 7.8 g/dL — ABNORMAL LOW (ref 13.0–17.0)
O2 Saturation: 100 %
Patient temperature: 97.5
Potassium: 6 mmol/L — ABNORMAL HIGH (ref 3.5–5.1)
Sodium: 136 mmol/L (ref 135–145)
TCO2: 27 mmol/L (ref 22–32)
pCO2 arterial: 49.2 mmHg — ABNORMAL HIGH (ref 32–48)
pH, Arterial: 7.327 — ABNORMAL LOW (ref 7.35–7.45)
pO2, Arterial: 521 mmHg — ABNORMAL HIGH (ref 83–108)

## 2022-07-29 LAB — PROTIME-INR
INR: 1.2 (ref 0.8–1.2)
Prothrombin Time: 15 seconds (ref 11.4–15.2)

## 2022-07-29 LAB — PREPARE FRESH FROZEN PLASMA

## 2022-07-29 LAB — BPAM RBC
Blood Product Expiration Date: 202404272359
ISSUE DATE / TIME: 202404172056

## 2022-07-29 LAB — PREPARE RBC (CROSSMATCH)

## 2022-07-29 LAB — BPAM PLATELET PHERESIS
Blood Product Expiration Date: 202404192359
Unit Type and Rh: 5100

## 2022-07-29 LAB — GLUCOSE, CAPILLARY
Glucose-Capillary: 107 mg/dL — ABNORMAL HIGH (ref 70–99)
Glucose-Capillary: 127 mg/dL — ABNORMAL HIGH (ref 70–99)

## 2022-07-29 LAB — BPAM FFP: ISSUE DATE / TIME: 202404172056

## 2022-07-29 LAB — MRSA NEXT GEN BY PCR, NASAL: MRSA by PCR Next Gen: NOT DETECTED

## 2022-07-29 SURGERY — EGD (ESOPHAGOGASTRODUODENOSCOPY)
Anesthesia: General

## 2022-07-29 SURGERY — EGD (ESOPHAGOGASTRODUODENOSCOPY)
Anesthesia: Monitor Anesthesia Care

## 2022-07-29 MED ORDER — MIDAZOLAM HCL 2 MG/2ML IJ SOLN: 2.0000 mg | Freq: Once | INTRAMUSCULAR | Status: AC

## 2022-07-29 MED ORDER — FENTANYL CITRATE PF 50 MCG/ML IJ SOSY
50.0000 ug | PREFILLED_SYRINGE | INTRAMUSCULAR | Status: DC | PRN
  Administered 2022-07-29: 100 ug via INTRAVENOUS
  Filled 2022-07-29: qty 2

## 2022-07-29 MED ORDER — DOCUSATE SODIUM 50 MG/5ML PO LIQD: 100.0000 mg | Freq: Two times a day (BID) | ORAL | Status: DC

## 2022-07-29 MED ORDER — POLYETHYLENE GLYCOL 3350 17 G PO PACK: 17.0000 g | PACK | Freq: Every day | ORAL | Status: DC

## 2022-07-29 MED ORDER — PANTOPRAZOLE 80MG IVPB - SIMPLE MED
80.0000 mg | Freq: Once | INTRAVENOUS | Status: AC
  Administered 2022-07-29: 80 mg via INTRAVENOUS
  Filled 2022-07-29: qty 100

## 2022-07-29 MED ORDER — SODIUM CHLORIDE 0.9% IV SOLUTION: Freq: Once | INTRAVENOUS | Status: DC

## 2022-07-29 MED ORDER — PROPOFOL BOLUS VIA INFUSION
10.0000 mg | Freq: Once | INTRAVENOUS | Status: AC
  Administered 2022-07-29: 10 mg via INTRAVENOUS
  Filled 2022-07-29: qty 10

## 2022-07-29 MED ORDER — FENTANYL CITRATE PF 50 MCG/ML IJ SOSY: 50.0000 ug | PREFILLED_SYRINGE | Freq: Once | INTRAMUSCULAR | Status: DC

## 2022-07-29 MED ORDER — KETAMINE HCL 50 MG/5ML IJ SOSY
PREFILLED_SYRINGE | INTRAMUSCULAR | Status: AC
  Filled 2022-07-29: qty 10

## 2022-07-29 MED ORDER — ORAL CARE MOUTH RINSE
15.0000 mL | OROMUCOSAL | Status: DC
  Administered 2022-07-30: 15 mL via OROMUCOSAL

## 2022-07-29 MED ORDER — ETOMIDATE 2 MG/ML IV SOLN: 20.0000 mg | Freq: Once | INTRAVENOUS | Status: AC

## 2022-07-29 MED ORDER — METOCLOPRAMIDE HCL 5 MG/ML IJ SOLN
10.0000 mg | Freq: Once | INTRAMUSCULAR | Status: AC
  Administered 2022-07-29: 10 mg via INTRAVENOUS

## 2022-07-29 MED ORDER — NOREPINEPHRINE 4 MG/250ML-% IV SOLN
INTRAVENOUS | Status: AC
  Administered 2022-07-29: 10 ug/min via INTRAVENOUS
  Filled 2022-07-29: qty 250

## 2022-07-29 MED ORDER — FENTANYL CITRATE PF 50 MCG/ML IJ SOSY: 50.0000 ug | PREFILLED_SYRINGE | INTRAMUSCULAR | Status: DC | PRN

## 2022-07-29 MED ORDER — FENTANYL CITRATE PF 50 MCG/ML IJ SOSY
50.0000 ug | PREFILLED_SYRINGE | Freq: Once | INTRAMUSCULAR | Status: AC
  Administered 2022-07-29: 50 ug via INTRAVENOUS

## 2022-07-29 MED ORDER — ROCURONIUM BROMIDE 10 MG/ML (PF) SYRINGE: 50.0000 mg | PREFILLED_SYRINGE | Freq: Once | INTRAVENOUS | Status: AC

## 2022-07-29 MED ORDER — ORAL CARE MOUTH RINSE: 15.0000 mL | OROMUCOSAL | Status: DC | PRN

## 2022-07-29 MED ORDER — MIDAZOLAM HCL 2 MG/2ML IJ SOLN
2.0000 mg | Freq: Once | INTRAMUSCULAR | Status: AC
  Administered 2022-07-29: 2 mg via INTRAVENOUS
  Filled 2022-07-29: qty 2

## 2022-07-29 MED ORDER — NOREPINEPHRINE 4 MG/250ML-% IV SOLN
2.0000 ug/min | INTRAVENOUS | Status: DC
  Administered 2022-07-30: 5 ug/min via INTRAVENOUS
  Filled 2022-07-29 (×2): qty 250

## 2022-07-29 MED ORDER — MIDAZOLAM HCL 2 MG/2ML IJ SOLN
1.0000 mg | INTRAMUSCULAR | Status: DC | PRN
  Administered 2022-07-29 – 2022-07-30 (×4): 2 mg via INTRAVENOUS
  Filled 2022-07-29 (×4): qty 2

## 2022-07-29 MED ORDER — ROCURONIUM BROMIDE 10 MG/ML (PF) SYRINGE
PREFILLED_SYRINGE | INTRAVENOUS | Status: AC
  Administered 2022-07-29: 50 mg via INTRAVENOUS
  Filled 2022-07-29: qty 10

## 2022-07-29 MED ORDER — MIDAZOLAM HCL 2 MG/2ML IJ SOLN
INTRAMUSCULAR | Status: AC
  Administered 2022-07-29: 2 mg via INTRAVENOUS
  Filled 2022-07-29: qty 2

## 2022-07-29 MED ORDER — SUCCINYLCHOLINE CHLORIDE 200 MG/10ML IV SOSY
PREFILLED_SYRINGE | INTRAVENOUS | Status: AC
  Filled 2022-07-29: qty 10

## 2022-07-29 MED ORDER — PHENYLEPHRINE 80 MCG/ML (10ML) SYRINGE FOR IV PUSH (FOR BLOOD PRESSURE SUPPORT)
PREFILLED_SYRINGE | INTRAVENOUS | Status: AC
  Filled 2022-07-29: qty 10

## 2022-07-29 MED ORDER — DOCUSATE SODIUM 100 MG PO CAPS: 100.0000 mg | ORAL_CAPSULE | Freq: Two times a day (BID) | ORAL | Status: DC | PRN

## 2022-07-29 MED ORDER — METOCLOPRAMIDE HCL 5 MG/ML IJ SOLN
10.0000 mg | Freq: Once | INTRAMUSCULAR | Status: AC
  Administered 2022-07-30: 10 mg via INTRAVENOUS
  Filled 2022-07-29: qty 2

## 2022-07-29 MED ORDER — FENTANYL CITRATE PF 50 MCG/ML IJ SOSY
PREFILLED_SYRINGE | INTRAMUSCULAR | Status: AC
  Administered 2022-07-29: 50 ug
  Filled 2022-07-29: qty 2

## 2022-07-29 MED ORDER — PANTOPRAZOLE INFUSION (NEW) - SIMPLE MED
8.0000 mg/h | INTRAVENOUS | Status: DC
  Administered 2022-07-29 – 2022-08-01 (×7): 8 mg/h via INTRAVENOUS
  Filled 2022-07-29 (×7): qty 100

## 2022-07-29 MED ORDER — FENTANYL CITRATE PF 50 MCG/ML IJ SOSY: 50.0000 ug | PREFILLED_SYRINGE | Freq: Once | INTRAMUSCULAR | Status: AC

## 2022-07-29 MED ORDER — POLYETHYLENE GLYCOL 3350 17 G PO PACK: 17.0000 g | PACK | Freq: Every day | ORAL | Status: DC | PRN

## 2022-07-29 MED ORDER — ETOMIDATE 2 MG/ML IV SOLN
INTRAVENOUS | Status: AC
  Administered 2022-07-29: 20 mg via INTRAVENOUS
  Filled 2022-07-29: qty 20

## 2022-07-29 MED ORDER — ONDANSETRON HCL 4 MG/2ML IJ SOLN
4.0000 mg | Freq: Once | INTRAMUSCULAR | Status: AC | PRN
  Administered 2022-07-29: 4 mg via INTRAVENOUS
  Filled 2022-07-29: qty 2

## 2022-07-29 MED ORDER — FENTANYL BOLUS VIA INFUSION
50.0000 ug | INTRAVENOUS | Status: DC | PRN
  Administered 2022-07-29 – 2022-07-30 (×8): 100 ug via INTRAVENOUS

## 2022-07-29 MED ORDER — FENTANYL 2500MCG IN NS 250ML (10MCG/ML) PREMIX INFUSION
INTRAVENOUS | Status: AC
  Administered 2022-07-29: 100 ug via INTRAVENOUS
  Filled 2022-07-29: qty 250

## 2022-07-29 MED ORDER — SODIUM CHLORIDE 0.9 % IV SOLN
250.0000 mL | INTRAVENOUS | Status: DC
  Administered 2022-07-29: 250 mL via INTRAVENOUS

## 2022-07-29 MED ORDER — CHLORHEXIDINE GLUCONATE CLOTH 2 % EX PADS
6.0000 | MEDICATED_PAD | Freq: Every day | CUTANEOUS | Status: DC
  Administered 2022-07-29 – 2022-08-02 (×5): 6 via TOPICAL

## 2022-07-29 MED ORDER — LACTATED RINGERS IV SOLN: INTRAVENOUS | Status: DC

## 2022-07-29 MED ORDER — METOCLOPRAMIDE HCL 5 MG/ML IJ SOLN
INTRAMUSCULAR | Status: AC
  Filled 2022-07-29: qty 2

## 2022-07-29 MED ORDER — CALCIUM GLUCONATE-NACL 1-0.675 GM/50ML-% IV SOLN
1.0000 g | Freq: Once | INTRAVENOUS | Status: AC
  Administered 2022-07-29: 1000 mg via INTRAVENOUS
  Filled 2022-07-29: qty 50

## 2022-07-29 MED ORDER — FAMOTIDINE 20 MG PO TABS: 20.0000 mg | ORAL_TABLET | Freq: Two times a day (BID) | ORAL | Status: DC

## 2022-07-29 MED ORDER — PROPOFOL 1000 MG/100ML IV EMUL
0.0000 ug/kg/min | INTRAVENOUS | Status: DC
  Administered 2022-07-29: 10 ug/kg/min via INTRAVENOUS
  Administered 2022-07-29 – 2022-07-30 (×3): 50 ug/kg/min via INTRAVENOUS
  Administered 2022-07-30: 45 ug/kg/min via INTRAVENOUS
  Filled 2022-07-29: qty 100
  Filled 2022-07-29: qty 200
  Filled 2022-07-29 (×2): qty 100

## 2022-07-29 MED ORDER — FENTANYL 2500MCG IN NS 250ML (10MCG/ML) PREMIX INFUSION
50.0000 ug/h | INTRAVENOUS | Status: DC
  Administered 2022-07-29: 100 ug/h via INTRAVENOUS
  Administered 2022-07-30: 150 ug/h via INTRAVENOUS
  Filled 2022-07-29: qty 250

## 2022-07-29 NOTE — Anesthesia Preprocedure Evaluation (Deleted)
Anesthesia Evaluation    Reviewed: Allergy & Precautions, Patient's Chart, lab work & pertinent test results  History of Anesthesia Complications Negative for: history of anesthetic complications  Airway        Dental   Pulmonary former smoker          Cardiovascular negative cardio ROS      Neuro/Psych negative neurological ROS     GI/Hepatic negative GI ROS, Neg liver ROS,,,  Endo/Other  negative endocrine ROS    Renal/GU negative Renal ROS     Musculoskeletal  (+) Arthritis ,    Abdominal   Peds  Hematology negative hematology ROS (+)   Anesthesia Other Findings   Reproductive/Obstetrics                             Anesthesia Physical Anesthesia Plan  ASA: 2  Anesthesia Plan: MAC   Post-op Pain Management: Minimal or no pain anticipated   Induction: Intravenous  PONV Risk Score and Plan: 1 and Ondansetron, Treatment may vary due to age or medical condition and Propofol infusion  Airway Management Planned: Natural Airway  Additional Equipment: None  Intra-op Plan:   Post-operative Plan:   Informed Consent:   Plan Discussed with: CRNA and Anesthesiologist  Anesthesia Plan Comments:        Anesthesia Quick Evaluation

## 2022-07-29 NOTE — ED Provider Notes (Signed)
[facility]ER[facility]ENC[facility]PART[facility] AT [facility] Provider Note   CSN: [id number] Arrival date & time: 07/29/22  1721     History  Chief Complaint  Patient presents with   GI Bleeding    Dillon Macdonald is a 52 y.o. male.  HPI 52 year old male presents with acute GI bleeding.  He recently had neck surgery and has been taking ibuprofen for this.  Today after he woke up from a nap he felt acutely ill and sweaty.  He tried to get up to go to the bathroom as he felt like he had to have a bowel movement and passed out.  He had multiple black stools and has vomited bright red blood multiple times.  EMS and patient described significant blood loss.  He is never had GI bleeding and denies liver disease, known ulcer, or alcohol abuse.  He is having some diffuse abdominal discomfort.  Upon arrival his blood pressures in the 60s and 70s.  Home Medications Prior to Admission medications   Medication Sig Start Date End Date Taking? Authorizing Provider  aspirin EC 81 MG tablet Take 1 tablet (81 mg total) by mouth daily. 05/18/16   Chilton Si, MD  busPIRone (BUSPAR) 10 MG tablet Take 10 mg by mouth daily as needed (Anxiety).    [provider]  cyclobenzaprine (FLEXERIL) 10 MG tablet Take 1 tablet (10 mg total) by mouth 3 (three) times daily as needed for muscle spasms. 07/28/22   Val Eagle D, NP  gabapentin (NEURONTIN) 100 MG capsule Take 1 capsule (100 mg total) by mouth 3 (three) times daily. 07/28/22 10/26/22  Val Eagle D, NP  HYDROcodone-acetaminophen (NORCO/VICODIN) 5-325 MG tablet Take 1-2 tablets by mouth every 4 (four) hours as needed for moderate pain ((score 4 to 6)). 07/28/22   Val Eagle D, NP      Allergies    Lactose intolerance (gi) and Sildenafil    Review of Systems   Review of Systems  Gastrointestinal:  Positive for abdominal pain, blood in stool and vomiting.  Neurological:  Positive for syncope and light-headedness.    Physical  Exam Updated Vital Signs BP (!) 80/53   Pulse (!) 107   Temp 99.5 F (37.5 C) (Axillary)   Resp 18   SpO2 98%  Physical Exam Vitals and nursing note reviewed.  Constitutional:      Appearance: He is well-developed. He is ill-appearing.  HENT:     Head: Normocephalic and atraumatic.  Cardiovascular:     Rate and Rhythm: Regular rhythm. Tachycardia present.     Heart sounds: Normal heart sounds.  Pulmonary:     Effort: Pulmonary effort is normal.  Abdominal:     Palpations: Abdomen is soft.     Tenderness: There is abdominal tenderness (generalized).  Skin:    General: Skin is warm and dry.  Neurological:     Mental Status: He is alert.     ED Results / Procedures / Treatments   Labs (all labs ordered are listed, but only abnormal results are displayed) Labs Reviewed  COMPREHENSIVE METABOLIC PANEL - Abnormal; Notable for the following components:      Result Value   Glucose, Bld 169 (*)    BUN 44 (*)    Calcium 7.9 (*)    Total Protein 4.6 (*)    Albumin 2.9 (*)    Alkaline Phosphatase 34 (*)    All other components within normal limits  CBC WITH DIFFERENTIAL/PLATELET - Abnormal; Notable for the following components:  RBC 2.95 (*)    Hemoglobin 8.8 (*)    HCT 28.0 (*)    Platelets 135 (*)    All other components within normal limits  GLUCOSE, CAPILLARY - Abnormal; Notable for the following components:   Glucose-Capillary 107 (*)    All other components within normal limits  I-STAT CHEM 8, ED - Abnormal; Notable for the following components:   BUN 42 (*)    Glucose, Bld 167 (*)    Calcium, Ion 1.14 (*)    Hemoglobin 8.5 (*)    HCT 25.0 (*)    All other components within normal limits  MRSA NEXT GEN BY PCR, NASAL  PROTIME-INR  CBC  BASIC METABOLIC PANEL  MAGNESIUM  PHOSPHORUS  HEMOGLOBIN AND HEMATOCRIT, BLOOD  HIV ANTIBODY (ROUTINE TESTING W REFLEX)  TRIGLYCERIDES  BLOOD GAS, ARTERIAL  TYPE AND SCREEN  PREPARE RBC (CROSSMATCH)  PREPARE RBC  (CROSSMATCH)  PREPARE FRESH FROZEN PLASMA  PREPARE PLATELET PHERESIS    EKG None  Radiology DG CHEST PORT 1 VIEW  Result Date: 07/29/2022 CLINICAL DATA:  Intubation EXAM: PORTABLE CHEST 1 VIEW COMPARISON:  05/21/2022 FINDINGS: Endotracheal tube terminates 4.5 cm above the carina. Lungs are clear No pleural effusion or pneumothorax. The heart is normal in size. Cervical spine fixation hardware. IMPRESSION: Endotracheal tube terminates 4.5 cm above the carina. Electronically Signed   By: Charline Bills M.D.   On: 07/29/2022 20:46    Procedures .Critical Care  Performed by: Pricilla Loveless, MD Authorized by: Pricilla Loveless, MD   Critical care provider statement:    Critical care time (minutes):  40   Critical care time was exclusive of:  Separately billable procedures and treating other patients   Critical care was necessary to treat or prevent imminent or life-threatening deterioration of the following conditions:  Circulatory failure   Critical care was time spent personally by me on the following activities:  Development of treatment plan with patient or surrogate, discussions with consultants, evaluation of patient's response to treatment, examination of patient, ordering and review of laboratory studies, ordering and review of radiographic studies, ordering and performing treatments and interventions, pulse oximetry, re-evaluation of patient's condition and review of old charts     Medications Ordered in ED Medications  pantoprozole (PROTONIX) 80 mg /NS 100 mL infusion (8 mg/hr Intravenous New Bag/Given 07/29/22 1809)  0.9 %  sodium chloride infusion (Manually program via Guardrails IV Fluids) (has no administration in time range)  lactated ringers infusion ( Intravenous New Bag/Given 07/29/22 2010)  calcium gluconate 1 g/ 50 mL sodium chloride IVPB (has no administration in time range)  0.9 %  sodium chloride infusion (Manually program via Guardrails IV Fluids) (has no  administration in time range)  Chlorhexidine Gluconate Cloth 2 % PADS 6 each (has no administration in time range)  ketamine HCl 50 MG/5ML SOSY (has no administration in time range)  succinylcholine (ANECTINE) 200 MG/10ML syringe (has no administration in time range)  0.9 %  sodium chloride infusion (has no administration in time range)  norepinephrine (LEVOPHED)  in (0.016 mg/mL) premix infusion (10 mcg/min Intravenous New Bag/Given 07/29/22 2018)  docusate (COLACE) 50 MG/5ML liquid 100 mg (has no administration in time range)  polyethylene glycol (MIRALAX / GLYCOLAX) packet 17 g (has no administration in time range)  propofol (DIPRIVAN) 1000 MG/100ML infusion (10 mcg/kg/min  77.1 kg Intravenous New Bag/Given 07/29/22 2034)  midazolam (VERSED) injection 1-2 mg (2 mg Intravenous Given 07/29/22 2104)  phenylephrine 80 mcg/10 mL injection (has no  administration in time range)  famotidine (PEPCID) tablet 20 mg (has no administration in time range)  Oral care mouth rinse (has no administration in time range)  Oral care mouth rinse (has no administration in time range)  fentaNYL in NS (70mcg/ml) infusion-PREMIX (100 mcg/hr Intravenous New Bag/Given 07/29/22 2047)  fentaNYL (SUBLIMAZE) bolus via infusion 50-100 mcg (100 mcg Intravenous Bolus from Bag 07/29/22 2130)  propofol (DIPRIVAN) bolus via infusion 10 mg (has no administration in time range)  pantoprazole (PROTONIX) 80 mg /NS 100 mL IVPB (0 mg Intravenous Stopped 07/29/22 1843)  ondansetron (ZOFRAN) injection 4 mg (4 mg Intravenous Given 07/29/22 1904)  fentaNYL (SUBLIMAZE) injection 50 mcg (50 mcg Intravenous Given 07/29/22 2021)  midazolam (VERSED) injection 2 mg (2 mg Intravenous Given 07/29/22 2023)  etomidate (AMIDATE) injection 20 mg (20 mg Intravenous Given 07/29/22 2025)  rocuronium bromide 10 mg/mL (PF) syringe (50 mg Intravenous Given 07/29/22 2025)  fentaNYL (SUBLIMAZE) injection 50 mcg (50 mcg Intravenous Given 07/29/22  2019)  midazolam (VERSED) injection 2 mg (2 mg Intravenous Given 07/29/22 2020)    ED Course/ Medical Decision Making/ A&P                             Medical Decision Making Amount and/or Complexity of Data Reviewed Labs: ordered.    Details: I-STAT hemoglobin of 8.5.  BUN elevation consistent with GI bleed.  Hemoglobin is multiple points lower than his baseline.  Risk Prescription drug management. Decision regarding hospitalization.   Patient presents with acute GI bleed.  He is ill-appearing on arrival and given he was hypotensive in the 60s-70s, he was given a unit of emergency release blood as well as the fluids that were started by nursing staff.  He had a significant response and is feeling a lot better.  Discussed with GI, Dr. Myrtie Neither, who agrees with treatment so far including Protonix bolus and drip which has already been started.  He will consult but otherwise will need ICU admission.  I did discuss with the ICU who has seen and will admit though he started developing further hypotension and required more blood and so Dr. Myrtie Neither was made aware and will come in for EGD and consultation.  Patient is hovering around the 90s currently with his blood pressure and appears stable for transfer to the ICU.  Likely this is an upper GI bleed from NSAID use.        Final Clinical Impression(s) / ED Diagnoses Final diagnoses:  Upper GI bleed  Blood loss anemia  Shock    Rx / DC Orders ED Discharge Orders     None         Pricilla Loveless, MD 07/29/22 2140

## 2022-07-29 NOTE — Procedures (Signed)
Intubation Procedure Note  Dillon Macdonald  [id number]  November 01, 1970  Date:07/29/22  Time:8:37 PM   Provider Performing:[place] Masaki L Mirha Brucato    Procedure: Intubation (31500)  Indication(s) Respiratory Failure  Consent Risks of the procedure as well as the alternatives and risks of each were explained to the patient and/or caregiver.  Consent for the procedure was obtained and is signed in the bedside chart   Anesthesia Etomidate, Versed, Fentanyl, and Rocuronium   Time Out Verified patient identification, verified procedure, site/side was marked, verified correct patient position, special equipment/implants available, medications/allergies/relevant history reviewed, required imaging and test results available.   Sterile Technique Usual hand hygeine, masks, and gloves were used   Procedure Description Patient positioned in bed supine.  Sedation given as noted above.  Patient was intubated with endotracheal tube using Glidescope.  View was Grade 1 full glottis .  Number of attempts was 1.  Colorimetric CO2 detector was consistent with tracheal placement.   Complications/Tolerance None; patient tolerated the procedure well. Chest X-ray is ordered to verify placement.   EBL Minimal   Specimen(s) None  Josephine Igo, DO [facility] 07/29/2022 8:37 PM

## 2022-07-29 NOTE — H&P (Signed)
NAME:  Dillon Macdonald, MRN:  [id number], DOB:  06-12-1970, LOS: 0 ADMISSION DATE:  07/29/2022, CONSULTATION DATE:  4/17 REFERRING MD:  Dr. Criss Alvine, CHIEF COMPLAINT:  gi bleed   History of Present Illness:  Patient is a 52 yo M w/ pertinent PMH of recent reck surgery (Left C7-T1 laminotomies/foraminotomies) discharged 4/16 taking ibu profen admitted to [facility] ED on 4/17 w/ GI bleed.   Patient had cervical radiculopathy and came to [facility] for elective surgery on 4/15. Patient received Left C7-T1 laminotomies/foraminotomies. Discharged on 4/16 and has been taking ibu profen for pain. On 4/17 patient woke up from nap diaphoretic and felt acutely ill. Patient went to bathroom and passed out. He had multiple black stools and vomited bright red blood to the point "there was a blood bath". EMS called and transported patient to [facility] Ed. No hx of GI bleed or liver disease, alcohol abuse.  Upon arrival to [facility], patient ill appearing and hypotensive with sbp 60-70s. Patient complaining of abdominal tenderness. Patient type and screened and was given 1 unit PRBC w/ improvement in BP. CBC showing hgb 8.5. Patient started on PPI. PCCM consulted.    Pertinent  Medical History   Past Medical History:  Diagnosis Date   Arthritis    Atypical chest pain 01/17/2016   Back pain    Herpes simplex    Hypogonadism male    Shortness of breath    feels that way all the time, ? pain or Percocet   Thrombocytopenia 2014   Tobacco abuse 05/18/2016     Significant Hospital Events: Including procedures, antibiotic start and stop dates in addition to other pertinent events   4/17 admitted w/ gi bleed  Interim History / Subjective:  Feels "rough". BP improved after 1u PRBC.  Objective   Blood pressure 114/76, pulse 100, temperature (!) 97.4 F (36.3 C), temperature source Temporal, resp. rate 14, SpO2 100 %.       No intake or output data in the 24 hours ending 07/29/22 1817 There were no vitals filed for this  visit.  Examination: General: Adult male, resting in bed, in NAD. Neuro: A&O x 3, no deficits. HEENT: [place]/AT. Sclerae anicteric. EOMI. Cardiovascular: RRR, no M/R/G.  Lungs: Respirations even and unlabored.  CTA bilaterally, No W/R/R. Abdomen: BS x 4, soft, NT/ND.  Musculoskeletal: No gross deformities, no edema.  Skin: Pale, warm, no rashes.    Assessment & Plan:    GIB - presumed upper, likely PUD given recent NSAID use following his neck surgery. - GI consulted by EDP, they will follow up again this evening but tentative plan is for EGD 07/30/22 unless has any decompensation tonight. - Continue PPI. - H/H q6hrs and monitor for further bleeding. - Education on NSAID avoidance. - Hold home ASA.   Hypocalcemia. - 1g Ca gluconate. - Follow BMP.   Hx Cervical Radiculopathy - s/p C7 and T1 laminotomies and foraminotomies with microdiscectomy (Dr. Jordan Likes 07/27/22) - F/u with neurosurgery as outpatient. - Hold home flexeril and gabapentin.  Hx of anxiety. - Hold buspar for now.  Best Practice (right click and "Reselect all SmartList Selections" daily)   Diet/type: NPO DVT prophylaxis: SCD GI prophylaxis: PPI Lines: N/A Foley:  N/A Code Status:  full code Last date of multidisciplinary goals of care discussion [pending]  Labs   CBC: Recent Labs  Lab 07/29/22 1737  HGB 8.5*  HCT 25.0*    Basic Metabolic Panel: Recent Labs  Lab 07/29/22 1737  NA 138  K  4.3  CL 103  GLUCOSE 167*  BUN 42*  CREATININE 1.00   GFR: Estimated Creatinine Clearance: 89.4 mL/min (by C-G formula based on SCr of 1 mg/dL). No results for input(s): "PROCALCITON", "WBC", "LATICACIDVEN" in the last 168 hours.  Liver Function Tests: No results for input(s): "AST", "ALT", "ALKPHOS", "BILITOT", "PROT", "ALBUMIN" in the last 168 hours. No results for input(s): "LIPASE", "AMYLASE" in the last 168 hours. No results for input(s): "AMMONIA" in the last 168 hours.  ABG    Component Value  Date/Time   TCO2 25 07/29/2022 1737     Coagulation Profile: No results for input(s): "INR", "PROTIME" in the last 168 hours.  Cardiac Enzymes: No results for input(s): "CKTOTAL", "CKMB", "CKMBINDEX", "TROPONINI" in the last 168 hours.  HbA1C: No results found for: "HGBA1C"  CBG: No results for input(s): "GLUCAP" in the last 168 hours.  Review of Systems:   All negative; except for those that are bolded, which indicate positives.  Constitutional: weight loss, weight gain, night sweats, fevers, chills, fatigue, weakness.  HEENT: headaches, sore throat, sneezing, nasal congestion, post nasal drip, difficulty swallowing, tooth/dental problems, visual complaints, visual changes, ear aches. Neuro: difficulty with speech, weakness, numbness, ataxia. CV:  chest pain, orthopnea, PND, swelling in lower extremities, dizziness, palpitations, syncope.  Resp: cough, hemoptysis, dyspnea, wheezing. GI: heartburn, indigestion, abdominal pain, nausea, vomiting, diarrhea, constipation, change in bowel habits, loss of appetite, hematemesis, melena, hematochezia.  GU: dysuria, change in color of urine, urgency or frequency, flank pain, hematuria. MSK: joint pain or swelling, decreased range of motion. Psych: change in mood or affect, depression, anxiety, suicidal ideations, homicidal ideations. Skin: rash, itching, bruising.   Past Medical History:  He,  has a past medical history of Arthritis, Atypical chest pain (01/17/2016), Back pain, Herpes simplex, Hypogonadism male, Shortness of breath, Thrombocytopenia (2014), and Tobacco abuse (05/18/2016).   Surgical History:   Past Surgical History:  Procedure Laterality Date   BACK SURGERY     lumbar fusion L3- 4, S 1   CERVICAL FUSION  2014   POSTERIOR CERVICAL LAMINECTOMY Left 07/27/2022   Procedure: Microdiscectomy - left Cervical seven-Thoracic one;  Surgeon: Julio Sicks, MD;  Location: [facility] OR;  Service: Neurosurgery;  Laterality: Left;    VASECTOMY       Social History:   reports that he quit smoking about 10 months ago. His smoking use included cigarettes. He has a 40.00 pack-year smoking history. He has never used smokeless tobacco. He reports that he does not drink alcohol and does not use drugs.   Family History:  His family history includes Heart attack in his brother, brother, and paternal uncle; Hypertension in his brother; Hyperthyroidism in his mother; Stroke in his father. There is no history of Other.   Allergies Allergies  Allergen Reactions   Lactose Intolerance (Gi) Other (See Comments)   Sildenafil Other (See Comments)    Blurry vision     Home Medications  Prior to Admission medications   Medication Sig Start Date End Date Taking? Authorizing Provider  aspirin EC 81 MG tablet Take 1 tablet (81 mg total) by mouth daily. 05/18/16   Chilton Si, MD  busPIRone (BUSPAR) 10 MG tablet Take 10 mg by mouth daily as needed (Anxiety).    [provider]  cyclobenzaprine (FLEXERIL) 10 MG tablet Take 1 tablet (10 mg total) by mouth 3 (three) times daily as needed for muscle spasms. 07/28/22   Val Eagle D, NP  gabapentin (NEURONTIN) 100 MG capsule Take 1 capsule (  100 mg total) by mouth 3 (three) times daily. 07/28/22 10/26/22  Val Eagle D, NP  HYDROcodone-acetaminophen (NORCO/VICODIN) 5-325 MG tablet Take 1-2 tablets by mouth every 4 (four) hours as needed for moderate pain ((score 4 to 6)). 07/28/22   Val Eagle D, NP     Critical care time: 35 min.   Rutherford Guys, PA - [facility] For pager details, please see AMION or use Epic chat  After 1900, please call [facility] for cross coverage needs 07/29/2022, 6:46 PM

## 2022-07-29 NOTE — Progress Notes (Addendum)
eLink Physician-Brief Progress Note Patient Name: EMANNUEL VISE DOB: 08-03-1970 MRN: [id number]   Date of Service  07/29/2022  HPI/Events of Note  52 year old male with history of neck pain at this status post recent neck surgery for the pain.  Patient had been on high doses of NSAIDs prior to surgery.  He presented to the emergency department with complaints of vomiting blood and passing dark tarry stools.  He was hypotensive and had had a 7 point drop in hemoglobin from last week.  Admitted to ICU for hemorrhagic shock, upper GI bleed.  Electively intubated in ICU for EGD.    Patient having issues with sedation for EGD.  eICU Interventions  Currently intubated. Sedation with fentanyl and propofol.  On PPI. 2 large-bore IVs at all times.  Keep 2 units packed red cells on hold at all times.  Transfuse for anemia and hypotension.  Will follow.  On fentanyl and propofol.  As needed Versed added.  Okay to go up to 60 mcg/kg/min of propofol while patient is having EGD.  Communicated to bedside nurse.  Consider ketamine if these measures are not enough.     Intervention Category Major Interventions: Other: Intermediate Interventions: Bleeding - evaluation and treatment with blood products  Carilyn Goodpasture 07/29/2022, 8:47 PM

## 2022-07-29 NOTE — ED Notes (Signed)
Pt's BP went down to 80s. Md Agarwala made aware.

## 2022-07-29 NOTE — Consult Note (Signed)
[facility] Gastroenterology Consult Note   History Dillon Macdonald MRN # [id number]  Date of Admission: 07/29/2022 Date of Consultation: 07/29/2022 Referring physician: Dr. Lynnell Catalan, MD Primary Care Provider: Pcp, No Primary Gastroenterologist: None   Reason for Consultation/Chief Complaint: Upper GI bleed  Subjective  HPI:  52 year old man came to the ED this evening after the onset of upper GI bleeding several hours prior.  He passed black tarry stool several times, then had multiple episodes of hematemesis with clots and then passed out at least once.  His family called 911 and he is currently in the trauma room.  He is alert and conversational, though pale and weak.  He was hypotensive and tachycardic persistently, and his initial hemoglobin was 8.8 down from 15 just over a week ago on preoperative labs.  He has so far received 2 units PRBCs, as blood pressures come up to 105/90 but remains tachycardic at a pulse of 105.  Dillon Macdonald just had cervical spine laminotomy and foraminotomies with microdiscectomy 2 days ago.  He was discharged home after an uneventful postop course yesterday.  Patient reports having been using ibuprofen regularly for particular the last few weeks prior to his surgery.  He currently describes some vague abdominal discomfort, he has been witnessed to have hematemesis and melena in the ED.  His daughter and her fianc are at the bedside.  I have also spoken with this patient's critical care team and ED physician.  ROS:  Constitutional: Neck and upper back pain.  Also fatigue   All other systems are negative except as noted above in the HPI  Past Medical History Past Medical History:  Diagnosis Date   Arthritis    Atypical chest pain 01/17/2016   Back pain    Herpes simplex    Hypogonadism male    Shortness of breath    feels that way all the time, ? pain or Percocet   Thrombocytopenia 2014   Tobacco abuse 05/18/2016    Past Surgical  History Past Surgical History:  Procedure Laterality Date   BACK SURGERY     lumbar fusion L3- 4, S 1   CERVICAL FUSION  2014   POSTERIOR CERVICAL LAMINECTOMY Left 07/27/2022   Procedure: Microdiscectomy - left Cervical seven-Thoracic one;  Surgeon: Dillon Sicks, MD;  Location: [facility] OR;  Service: Neurosurgery;  Laterality: Left;   VASECTOMY      Family History Family History  Problem Relation Age of Onset   Hyperthyroidism Mother    Stroke Father    Heart attack Brother    Heart attack Paternal Uncle    Heart attack Brother    Hypertension Brother    Other Neg Hx        low testosterone    Social History Social History   Socioeconomic History   Marital status: Divorced    Spouse name: Not on file   Number of children: Not on file   Years of education: Not on file   Highest education level: Not on file  Occupational History   Not on file  Tobacco Use   Smoking status: Former    Packs/day: 2.00    Years: 20.00    Additional pack years: 0.00    Total pack years: 40.00    Types: Cigarettes    Quit date: 09/2021    Years since quitting: 0.8   Smokeless tobacco: Never  Vaping Use   Vaping Use: Never used  Substance and Sexual Activity   Alcohol use: No  Drug use: No   Sexual activity: Not on file  Other Topics Concern   Not on file  Social History Narrative   Not on file   Social Determinants of Health   Financial Resource Strain: Not on file  Food Insecurity: Not on file  Transportation Needs: Not on file  Physical Activity: Not on file  Stress: Not on file  Social Connections: Not on file    Allergies Allergies  Allergen Reactions   Lactose Intolerance (Gi) Other (See Comments)   Sildenafil Other (See Comments)    Blurry vision    Outpatient Meds Home medications from the H+P and/or nursing med reconciliation reviewed.  Inpatient med list reviewed  _____________________________________________________________________ Objective    Exam:  Current vital signs  Patient Vitals for the past 8 hrs:  BP Temp Temp src Pulse Resp SpO2  07/29/22 1927 114/67 -- -- (!) 104 20 100 %  07/29/22 1924 (!) 110/58 -- -- (!) 123 (!) 21 100 %  07/29/22 1900 107/63 -- -- (!) 101 10 100 %  07/29/22 1854 98/67 -- -- 98 14 100 %  07/29/22 1851 (!) 88/71 -- -- 94 13 100 %  07/29/22 1845 116/66 -- -- 96 15 100 %  07/29/22 1843 -- -- -- 100 15 100 %  07/29/22 1842 110/63 -- -- 98 13 100 %  07/29/22 1839 109/75 -- -- (!) 104 13 100 %  07/29/22 1830 107/75 -- -- (!) 101 14 100 %  07/29/22 1812 114/76 -- -- 100 14 100 %  07/29/22 1810 -- -- -- 96 14 100 %  07/29/22 1809 106/77 -- -- (!) 104 13 100 %  07/29/22 1800 97/79 -- -- 98 14 100 %  07/29/22 1757 94/63 -- -- 99 18 100 %  07/29/22 1749 -- -- -- 99 15 100 %  07/29/22 1748 94/61 -- -- 100 12 100 %  07/29/22 1745 100/60 -- -- (!) 107 17 100 %  07/29/22 1742 102/66 -- -- (!) 103 19 100 %  07/29/22 1740 (!) 89/69 -- -- (!) 108 (!) 21 99 %  07/29/22 1739 -- -- -- (!) 111 16 100 %  07/29/22 1738 -- -- -- (!) 110 15 100 %  07/29/22 1737 91/62 -- -- -- 14 --  07/29/22 1730 96/64 -- -- (!) 128 16 100 %  07/29/22 1726 -- (!) 97.4 F (36.3 C) Temporal -- -- --   No intake or output data in the 24 hours ending 07/29/22 1952  Physical Exam:   General: this is an acutely ill-appearing pale man.  He has limited range of neck motion from pain Eyes: sclera anicteric, no redness ENT: oral mucosa moist without lesions, no cervical or supraclavicular lymphadenopathy, good dentition CV: RRR without murmur, S1/S2, no JVD,, no peripheral edema Resp: clear to auscultation bilaterally, normal RR and effort noted GI: soft, mild upper tenderness, with normal active bowel sounds. No guarding or palpable organomegaly noted.  No distention Skin; warm and dry, no rash or jaundice noted Neuro: awake, alert and oriented x 3. Normal gross motor function and fluent speech.  Labs:     Latest Ref Rng &  Units 07/29/2022    5:37 PM 07/29/2022    5:33 PM 07/21/2022    1:39 PM  CBC  WBC 4.0 - 10.5 K/uL  9.9  5.5   Hemoglobin 13.0 - 17.0 g/dL 8.5  8.8  16.1   Hematocrit 39.0 - 52.0 % 25.0  28.0  43.4   Platelets  150 - 400 K/uL  135  134        Latest Ref Rng & Units 07/29/2022    5:37 PM 07/29/2022    5:33 PM 07/21/2022    1:39 PM  CMP  Glucose 70 - 99 mg/dL 409  811  95   BUN 6 - 20 mg/dL 42  44  23   Creatinine 0.61 - 1.24 mg/dL 9.14  7.82  9.56   Sodium 135 - 145 mmol/L 138  137  139   Potassium 3.5 - 5.1 mmol/L 4.3  4.2  4.2   Chloride 98 - 111 mmol/L 103  106  105   CO2 22 - 32 mmol/L  24  26   Calcium 8.9 - 10.3 mg/dL  7.9  9.1   Total Protein 6.5 - 8.1 g/dL  4.6    Total Bilirubin 0.3 - 1.2 mg/dL  0.4    Alkaline Phos 38 - 126 U/L  34    AST 15 - 41 U/L  20    ALT 0 - 44 U/L  14      Recent Labs  Lab 07/29/22 1733  INR 1.2   _________________________________________________________ Radiologic studies:   ______________________________________________________ Other studies:   _______________________________________________________ Assessment & Plan  Impression: Hematemesis Melena Acute blood loss anemia  This man most likely has a gastric or duodenal NSAID induced ulcer with ongoing GI bleeding and hemodynamic instability with hypotension and tachycardia.  He has marked acute blood loss anemia with a hemoglobin of 8.8 down from a normal level of 15 8 days ago.  He has been evaluated by the critical care team and is en route to the ICU.  I spoke with him about the need for an upper endoscopy tonight as a bedside exam with the endoscopy team, and that I also feel he needs elective intubation for airway protection.  That procedure was described in detail along with risk and benefits and he was agreeable.  The benefits and risks of the planned procedure were described in detail with the patient or (when appropriate) their health care proxy.  Risks were outlined as  including, but not limited to, bleeding, infection, perforation, adverse medication reaction leading to cardiac or pulmonary decompensation, pancreatitis (if ERCP).  The limitation of incomplete mucosal visualization was also discussed.  No guarantees or warranties were given.  Patient at increased risk for cardiopulmonary complications of procedure due to medical comorbidities. (Acute bleeding, marked anemia, recent cervical/thoracic spine surgery) His family was present at the bedside during my entire visit.   He has had 2 units PRBCs, blood pressure improved for now, still tachycardic.  He has good peripheral IV access  INR normal, Protonix bolus given and 8 mg an hour drip running.   The plan is for endoscopic localization and hopeful cessation of the bleeding with further plans to follow.    Thank you for the courtesy of this consult.  Please contact me with any questions or concerns.  Charlie Pitter III Office: [phone]

## 2022-07-29 NOTE — Op Note (Signed)
[facility] Patient Name: Dillon Macdonald Procedure Date : 07/29/2022 MRN: [id number] Attending MD: Starr Lake. Myrtie Neither , MD, [id number] Date of Birth: Oct 28, 1970 CSN: [id number] Age: 52 Admit Type: Inpatient Procedure:                Upper GI endoscopy Indications:              Acute post hemorrhagic anemia, Hematemesis, Melena Providers:                Sherilyn Cooter L. Myrtie Neither, MD, Lorenza Evangelist, RN, Leanne Lovely, Technician Referring MD:             Initially [facility] ED physician, then ICU team. Medicines:                Monitored Anesthesia Care Complications:            No immediate complications. Estimated Blood Loss:     Estimated blood loss: none. Procedure:                Pre-Anesthesia Assessment:                           - Prior to the procedure, a History and Physical                            was performed, and patient medications and                            allergies were reviewed. The patient's tolerance of                            previous anesthesia was also reviewed. The risks                            and benefits of the procedure and the sedation                            options and risks were discussed with the patient.                            All questions were answered, and informed consent                            was obtained. Prior Anticoagulants: The patient has                            taken no anticoagulant or antiplatelet agents. ASA                            Grade Assessment: IV - A patient with severe                            systemic disease that is a constant threat to life.  After reviewing the risks and benefits, the patient                            was deemed in satisfactory condition to undergo the                            procedure.                           After obtaining informed consent, the endoscope was                            passed under direct vision.  Throughout the                            procedure, the patient's blood pressure, pulse, and                            oxygen saturations were monitored continuously. The                            [id number]-[id number] ([id number]) Therapeutic endoscope was                            introduced through the mouth, and advanced to the                            second part of duodenum. The upper GI endoscopy was                            performed with difficulty due to presence of food.                            The patient tolerated the procedure well. Scope In: Scope Out: Findings:      The esophagus was normal.      A large amount of food and both black as well as fresh and clotted blood       was found in the gastric fundus and in the gastric body. This severely       limited visualization in the proximal half of the stomach. The distal       half of the stomach was only notable for a diminutive erosion in the       prepyloric area, on otherwise normal with no apparent bleeding sources.      Over the course of greater than 90 minutes, countless passes of the       scope were made to clear the stomach of clots and food using a retrieval       net. This resulted in significantly improved visualization to the point       that it could be determined there was no active bleeding. No clear       source of the bleeding could be seen because there was still adherent       black material and some scattered food debris that could not be       completely cleared. Good visualization was obtained of  the gastric       cardia, and there were no apparent gastric varices, and no Dieulafoy's       lesion or ulcers clearly seen. By the end of the procedure, the       patient's heart rate had normalized into the 80s, and his blood pressure       was well into the normal range on a low-dose of Levophed that was being       weaned off by the ICU nurse.      The examined duodenum was normal. Impression:                - Normal esophagus.                           - Clotted blood in the gastric fundus and in the                            gastric body.                           - Normal examined duodenum.                           - No specimens collected. Recommendation:           - Keep patient intubated overnight in the ICU with                            all supportive care.                           Patient received another unit of PRBCs as well as                            10 mg of IV metoclopramide during the procedure.                           Monitor serial hemoglobin and hematocrit, starting                            3 hours after finishing the most recent transfusion.                           An order will be given for another 10 mg of IV                            Reglan early tomorrow morning.                           Signout will be given to the daytime consult team                            with recommendation to perform a repeat upper                            endoscopy tomorrow.  Continue Protonix drip.                           OG tube was not placed because this patient does                            not need to receive any medicines by the enteral                            route, and the tube seems likely to clog given the                            residual stomach contents.                           - The findings and recommendations were discussed                            with the patient's family. (Daughter by telephone) Procedure Code(s):        --- Professional ---                           [phone], Esophagogastroduodenoscopy, flexible,                            transoral; diagnostic, including collection of                            specimen(s) by brushing or washing, when performed                            (separate procedure) Diagnosis Code(s):        --- Professional ---                           K92.2, Gastrointestinal hemorrhage,  unspecified                           D62, Acute posthemorrhagic anemia                           K92.0, Hematemesis                           K92.1, Melena (includes Hematochezia) CPT copyright 2022 American Medical Association. All rights reserved. The codes documented in this report are preliminary and upon coder review may  be revised to meet current compliance requirements. Ilyse Tremain L. Myrtie Neither, MD 07/29/2022 11:53:45 PM This report has been signed electronically. Number of Addenda: 0

## 2022-07-29 NOTE — ED Notes (Signed)
ED TO INPATIENT HANDOFF REPORT  ED Nurse Name and Phone #: [phone], Lendon Ka Name/Age/Gender Dillon Macdonald 52 y.o. male Room/Bed: TRACC/TRACC  Code Status   Code Status: Full Code  Home/SNF/Other Home Patient oriented to: self, place, time, and situation Is this baseline? Yes   Triage Complete: Triage complete  Chief Complaint GI bleed [K92.2]  Triage Note No notes on file   Allergies Allergies  Allergen Reactions   Lactose Intolerance (Gi) Other (See Comments)   Sildenafil Other (See Comments)    Blurry vision    Level of Care/Admitting Diagnosis ED Disposition     ED Disposition  Admit   Condition  --   Comment  Hospital Area: [facility] [[id number]]  Level of Care: ICU [6]  May admit patient to [facility] or [facility] if equivalent level of care is available:: Yes  Covid Evaluation: Asymptomatic - no recent exposure (last 10 days) testing not required  Diagnosis: GI bleed [191478]  Admitting Physician: PCCM, MD [phone]  Attending Physician: Lynnell Catalan [phone]  Certification:: I certify there are rare and unusual circumstances requiring inpatient admission  Estimated Length of Stay: 4          B Medical/Surgery History Past Medical History:  Diagnosis Date   Arthritis    Atypical chest pain 01/17/2016   Back pain    Herpes simplex    Hypogonadism male    Shortness of breath    feels that way all the time, ? pain or Percocet   Thrombocytopenia 2014   Tobacco abuse 05/18/2016   Past Surgical History:  Procedure Laterality Date   BACK SURGERY     lumbar fusion L3- 4, S 1   CERVICAL FUSION  2014   POSTERIOR CERVICAL LAMINECTOMY Left 07/27/2022   Procedure: Microdiscectomy - left Cervical seven-Thoracic one;  Surgeon: Julio Sicks, MD;  Location: [facility] OR;  Service: Neurosurgery;  Laterality: Left;   VASECTOMY       A IV Location/Drains/Wounds Patient Lines/Drains/Airways Status     Active Line/Drains/Airways      Name Placement date Placement time Site Days   Peripheral IV 03/26/16 Right Antecubital 03/26/16  1100  Antecubital  2316   Peripheral IV 10/18/17 Left Antecubital 10/18/17  1408  Antecubital  1745   Peripheral IV 10/18/17 Left Antecubital 10/18/17  1445  Antecubital  1745   Peripheral IV 07/27/22 18 G Left;Posterior Hand 07/27/22  0810  Hand  2   Wound 12/18/12 Laceration Face Left see triage 12/18/12  1545  Face  3510            Intake/Output Last 24 hours No intake or output data in the 24 hours ending 07/29/22 1857  Labs/Imaging Results for orders placed or performed during the hospital encounter of 07/29/22 (from the past 48 hour(s))  Type and screen [facility]     Status: None (Preliminary result)   Collection Time: 07/29/22  5:31 PM  Result Value Ref Range   ABO/RH(D) A POS    Antibody Screen NEG    Sample Expiration      08/01/2022,2359 Performed at [facility], [address] [place], [place] [address]    Unit Number [id number]    Blood Component Type RED CELLS,LR    Unit division 00    Status of Unit ISSUED    Transfusion Status PENDING    Crossmatch Result PENDING   CBC with Differential     Status: Abnormal  Collection Time: 07/29/22  5:33 PM  Result Value Ref Range   WBC 9.9 4.0 - 10.5 K/uL   RBC 2.95 (L) 4.22 - 5.81 MIL/uL   Hemoglobin 8.8 (L) 13.0 - 17.0 g/dL   HCT 16.1 (L) 09.6 - 04.5 %   MCV 94.9 80.0 - 100.0 fL   MCH 29.8 26.0 - 34.0 pg   MCHC 31.4 30.0 - 36.0 g/dL   RDW 40.9 81.1 - 91.4 %   Platelets 135 (L) 150 - 400 K/uL   nRBC 0.0 0.0 - 0.2 %   Neutrophils Relative % 72 %   Neutro Abs 7.3 1.7 - 7.7 K/uL   Lymphocytes Relative 19 %   Lymphs Abs 1.9 0.7 - 4.0 K/uL   Monocytes Relative 6 %   Monocytes Absolute 0.5 0.1 - 1.0 K/uL   Eosinophils Relative 1 %   Eosinophils Absolute 0.1 0.0 - 0.5 K/uL   Basophils Relative 1 %   Basophils Absolute 0.1 0.0 - 0.1 K/uL   Immature Granulocytes 1 %   Abs Immature  Granulocytes 0.07 0.00 - 0.07 K/uL    Comment: Performed at [facility], [address], [place], [place] [address]  Protime-INR     Status: None   Collection Time: 07/29/22  5:33 PM  Result Value Ref Range   Prothrombin Time 15.0 11.4 - 15.2 seconds   INR 1.2 0.8 - 1.2    Comment: (NOTE) INR goal varies based on device and disease states. Performed at [facility], [address], [place], [place] [address]   Prepare RBC     Status: None   Collection Time: 07/29/22  5:34 PM  Result Value Ref Range   Order Confirmation      ORDER PROCESSED BY BLOOD BANK Performed at [facility], [address], [place], [place] [address]   I-Stat Chem 8, ED     Status: Abnormal   Collection Time: 07/29/22  5:37 PM  Result Value Ref Range   Sodium 138 135 - 145 mmol/L   Potassium 4.3 3.5 - 5.1 mmol/L   Chloride 103 98 - 111 mmol/L   BUN 42 (H) 6 - 20 mg/dL   Creatinine, Ser 4.69 0.61 - 1.24 mg/dL   Glucose, Bld 629 (H) 70 - 99 mg/dL    Comment: Glucose reference range applies only to samples taken after fasting for at least 8 hours.   Calcium, Ion 1.14 (L) 1.15 - 1.40 mmol/L   TCO2 25 22 - 32 mmol/L   Hemoglobin 8.5 (L) 13.0 - 17.0 g/dL   HCT 52.8 (L) 41.3 - 24.4 %   No results found.  Pending Labs Unresulted Labs (From admission, onward)     Start     Ordered   07/30/22 0500  CBC  Tomorrow morning,   R        07/29/22 1847   07/30/22 0500  Basic metabolic panel  Tomorrow morning,   R        07/29/22 1847   07/30/22 0500  Magnesium  Tomorrow morning,   R        07/29/22 1847   07/30/22 0500  Phosphorus  Tomorrow morning,   R        07/29/22 1847   07/30/22 0000  Hemoglobin and hematocrit, blood  Now then every 6 hours,   R      07/29/22 1848   07/29/22 1845  HIV Antibody (routine testing w rflx)  (HIV Antibody (Routine  testing w reflex) panel)  Once,   R        07/29/22 1847   07/29/22 1733  Comprehensive metabolic panel  Once,   STAT        07/29/22 1733             Vitals/Pain Today's Vitals   07/29/22 1843 07/29/22 1845 07/29/22 1851 07/29/22 1854  BP:  116/66 (!) 88/71 98/67  Pulse: 100 96 94 98  Resp: Temp:      TempSrc:      SpO2: 100% 100% 100% 100%    Isolation Precautions No active isolations  Medications Medications  pantoprozole (PROTONIX) 80 mg /NS 100 mL infusion (8 mg/hr Intravenous New Bag/Given 07/29/22 1809)  ondansetron (ZOFRAN) injection 4 mg (has no administration in time range)  0.9 %  sodium chloride infusion (Manually program via Guardrails IV Fluids) (has no administration in time range)  docusate sodium (COLACE) capsule 100 mg (has no administration in time range)  polyethylene glycol (MIRALAX / GLYCOLAX) packet 17 g (has no administration in time range)  lactated ringers infusion (has no administration in time range)  calcium gluconate 1 g/ 50 mL sodium chloride IVPB (has no administration in time range)  pantoprazole (PROTONIX) 80 mg /NS 100 mL IVPB (0 mg Intravenous Stopped 07/29/22 1843)    Mobility walks     Focused Assessments    R Recommendations: See Admitting Provider Note  Report given to:   Additional Notes:

## 2022-07-29 NOTE — Progress Notes (Signed)
Arrived to place USGPIV for vasopressors.  Patient is currently in a procedure.  Mindy, RN will have bedside RN re consult IV team when patient is available.

## 2022-07-29 NOTE — Brief Op Note (Signed)
Full EGD report to follow  Very long procedure to remove a large amount of clotted blood and food from the stomach. Still some limited visualization from adherent material, enough to determine there was no active bleeding.  No clear bleeding source was seen.  Duodenum was well-visualized with no active bleeding or source seen. Esophagitis without bleeding source or any abnormalities.   Patient received an additional unit of PRBCs during the procedure, and by the end of the procedure his blood pressure had normalized on a low-dose of Levophed and his heart rate normalized into the 80s.   Bleeding has stopped at present, suspect gastric ulcer or Dieulafoy's lesion not visualized due to factors noted above.  Keep patient intubated overnight.  No OG tube was placed because he does not need any medicines enterally at the moment, and if it is put to suction I believe the tube will probably clogged from residual food material in the stomach.  Attempted wean off vasopressors per critical care team.  10 mg of metoclopramide was given during the procedure  Given additional 10 mg dose of IV metoclopramide 6 hours after the first dose   Signout will be given to daytime consult team with plans for repeat upper endoscopy tomorrow.   Amada Jupiter, MD    [facility]

## 2022-07-30 ENCOUNTER — Encounter (HOSPITAL_COMMUNITY): Payer: Self-pay | Admitting: Gastroenterology

## 2022-07-30 ENCOUNTER — Encounter (HOSPITAL_COMMUNITY)
Admission: EM | Disposition: A | Discharge status: Home / Self Care | Payer: Self-pay | Source: Home / Self Care | Attending: Internal Medicine

## 2022-07-30 DIAGNOSIS — K3182 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: Secondary | ICD-10-CM | POA: Insufficient documentation

## 2022-07-30 DIAGNOSIS — K921 Melena: Secondary | ICD-10-CM | POA: Diagnosis not present

## 2022-07-30 DIAGNOSIS — K920 Hematemesis: Secondary | ICD-10-CM | POA: Diagnosis not present

## 2022-07-30 DIAGNOSIS — K922 Gastrointestinal hemorrhage, unspecified: Secondary | ICD-10-CM | POA: Diagnosis not present

## 2022-07-30 DIAGNOSIS — R579 Shock, unspecified: Secondary | ICD-10-CM

## 2022-07-30 DIAGNOSIS — D62 Acute posthemorrhagic anemia: Secondary | ICD-10-CM | POA: Diagnosis not present

## 2022-07-30 DIAGNOSIS — D500 Iron deficiency anemia secondary to blood loss (chronic): Secondary | ICD-10-CM

## 2022-07-30 HISTORY — PX: HOT HEMOSTASIS: SHX5433

## 2022-07-30 HISTORY — PX: SUBMUCOSAL INJECTION: SHX5543

## 2022-07-30 HISTORY — PX: ESOPHAGOGASTRODUODENOSCOPY: SHX5428

## 2022-07-30 LAB — PHOSPHORUS: Phosphorus: 2.9 mg/dL (ref 2.5–4.6)

## 2022-07-30 LAB — BPAM RBC
Blood Product Expiration Date: 202405132359
Blood Product Expiration Date: 202405152359
ISSUE DATE / TIME: 202404171734
ISSUE DATE / TIME: 202404172056
Unit Type and Rh: 5100
Unit Type and Rh: 6200
Unit Type and Rh: 6200

## 2022-07-30 LAB — PREPARE PLATELET PHERESIS

## 2022-07-30 LAB — BASIC METABOLIC PANEL
Anion gap: 7 (ref 5–15)
BUN: 26 mg/dL — ABNORMAL HIGH (ref 6–20)
CO2: 26 mmol/L (ref 22–32)
Calcium: 7.9 mg/dL — ABNORMAL LOW (ref 8.9–10.3)
Chloride: 107 mmol/L (ref 98–111)
Creatinine, Ser: 0.99 mg/dL (ref 0.61–1.24)
GFR, Estimated: >60 mL/min (ref >60)
Glucose, Bld: 104 mg/dL — ABNORMAL HIGH (ref 70–99)
Potassium: 4 mmol/L (ref 3.5–5.1)
Sodium: 140 mmol/L (ref 135–145)

## 2022-07-30 LAB — PREPARE FRESH FROZEN PLASMA

## 2022-07-30 LAB — CBC
HCT: 25.6 % — ABNORMAL LOW (ref 39.0–52.0)
Hemoglobin: 8.6 g/dL — ABNORMAL LOW (ref 13.0–17.0)
MCH: 29.7 pg (ref 26.0–34.0)
MCHC: 33.6 g/dL (ref 30.0–36.0)
MCV: 88.3 fL (ref 80.0–100.0)
Platelets: 103 10*3/uL — ABNORMAL LOW (ref 150–400)
RBC: 2.9 MIL/uL — ABNORMAL LOW (ref 4.22–5.81)
RDW: 14.5 % (ref 11.5–15.5)
WBC: 5.7 10*3/uL (ref 4.0–10.5)
nRBC: 0 % (ref 0.0–0.2)

## 2022-07-30 LAB — HEMOGLOBIN AND HEMATOCRIT, BLOOD
HCT: 25.8 % — ABNORMAL LOW (ref 39.0–52.0)
HCT: 26.3 % — ABNORMAL LOW (ref 39.0–52.0)
HCT: 26.7 % — ABNORMAL LOW (ref 39.0–52.0)
Hemoglobin: 9 g/dL — ABNORMAL LOW (ref 13.0–17.0)
Hemoglobin: 9.1 g/dL — ABNORMAL LOW (ref 13.0–17.0)
Hemoglobin: 9.5 g/dL — ABNORMAL LOW (ref 13.0–17.0)

## 2022-07-30 LAB — BPAM FFP
Blood Product Expiration Date: 202404222359
Unit Type and Rh: 6200

## 2022-07-30 LAB — TYPE AND SCREEN
Unit division: 0
Unit division: 0

## 2022-07-30 LAB — GLUCOSE, CAPILLARY
Glucose-Capillary: 105 mg/dL — ABNORMAL HIGH (ref 70–99)
Glucose-Capillary: 98 mg/dL (ref 70–99)
Glucose-Capillary: 81 mg/dL (ref 70–99)
Glucose-Capillary: 98 mg/dL (ref 70–99)

## 2022-07-30 LAB — TRIGLYCERIDES: Triglycerides: 149 mg/dL (ref <150)

## 2022-07-30 LAB — MAGNESIUM: Magnesium: 1.7 mg/dL (ref 1.7–2.4)

## 2022-07-30 LAB — HIV ANTIBODY (ROUTINE TESTING W REFLEX): HIV Screen 4th Generation wRfx: NONREACTIVE

## 2022-07-30 SURGERY — EGD (ESOPHAGOGASTRODUODENOSCOPY)
Anesthesia: Moderate Sedation

## 2022-07-30 MED ORDER — FENTANYL CITRATE PF 50 MCG/ML IJ SOSY
12.5000 ug | PREFILLED_SYRINGE | INTRAMUSCULAR | Status: DC | PRN
  Administered 2022-07-30 – 2022-07-31 (×4): 12.5 ug via INTRAVENOUS
  Filled 2022-07-30 (×4): qty 1

## 2022-07-30 MED ORDER — SODIUM CHLORIDE 0.9 % IV SOLN: INTRAVENOUS | Status: DC

## 2022-07-30 MED ORDER — ORAL CARE MOUTH RINSE
15.0000 mL | OROMUCOSAL | Status: DC
  Administered 2022-07-30 (×9): 15 mL via OROMUCOSAL

## 2022-07-30 MED ORDER — FENTANYL CITRATE (PF) 100 MCG/2ML IJ SOLN
INTRAMUSCULAR | Status: AC
  Filled 2022-07-30: qty 4

## 2022-07-30 MED ORDER — ORAL CARE MOUTH RINSE: 15.0000 mL | OROMUCOSAL | Status: DC | PRN

## 2022-07-30 MED ORDER — PHENOL 1.4 % MT LIQD
1.0000 | OROMUCOSAL | Status: DC | PRN
  Filled 2022-07-30: qty 177

## 2022-07-30 MED ORDER — PROPOFOL BOLUS VIA INFUSION
50.0000 mg | Freq: Once | INTRAVENOUS | Status: AC
  Administered 2022-07-30: 50 mg via INTRAVENOUS
  Filled 2022-07-30: qty 50

## 2022-07-30 MED ORDER — EPINEPHRINE 1 MG/10ML IJ SOSY
PREFILLED_SYRINGE | INTRAMUSCULAR | Status: AC
  Filled 2022-07-30: qty 20

## 2022-07-30 MED ORDER — MAGNESIUM SULFATE 2 GM/50ML IV SOLN
2.0000 g | Freq: Once | INTRAVENOUS | Status: AC
  Administered 2022-07-30: 2 g via INTRAVENOUS
  Filled 2022-07-30: qty 50

## 2022-07-30 MED ORDER — ORAL CARE MOUTH RINSE
15.0000 mL | Freq: Two times a day (BID) | OROMUCOSAL | Status: DC
  Administered 2022-08-01 – 2022-08-03 (×5): 15 mL via OROMUCOSAL

## 2022-07-30 MED ORDER — MIDAZOLAM HCL (PF) 5 MG/ML IJ SOLN
INTRAMUSCULAR | Status: AC
  Filled 2022-07-30: qty 2

## 2022-07-30 MED ORDER — SODIUM CHLORIDE (PF) 0.9 % IJ SOLN
PREFILLED_SYRINGE | INTRAMUSCULAR | Status: DC | PRN
  Administered 2022-07-30: 3 mL

## 2022-07-30 NOTE — Progress Notes (Signed)
Extubated earlier this afternoon s/p EGD Now awake and comfortable  Plan Wean O2 Clear liquids  Simonne Martinet ACNP-BC [facility] Pulmonary/Critical Care Pager # [phone] OR # [phone] if no answer

## 2022-07-30 NOTE — Procedures (Signed)
Extubation Procedure Note  Patient Details:   Name: Dillon Macdonald DOB: Dec 08, 1970 MRN: [id number]   Airway Documentation:    Vent end date: 07/30/22 Vent end time: 1440   Evaluation  O2 sats: stable throughout Complications: No apparent complications Patient did tolerate procedure well. Bilateral Breath Sounds: Clear   No  RT extubated pt per MD order. Patient currently on 6L [place] HFNC. Spo2 decreased initially but has settled out at 98%. Pt tolerating [place] well at this time.  Hiram Comber 07/30/2022, 2:49 PM

## 2022-07-30 NOTE — Progress Notes (Signed)
Patient presenting with acute upper GI bleed EGD from Dr. Myrtie Neither reviewed  I called and spoke to the patient's mother, Reed Dady by phone.  She did not realize the patient was back in the hospital and was unaware that he was having GI bleeding  I went over what has been done to this point and what caused him to represent to the emergency room.  I discussed the recommendation to repeat upper endoscopy this afternoon at bedside.  I went over the risk, benefits and alternatives and she gives permission to proceed.  I will have my endoscopy nurse follow-up with her to witness this consent.  I let her know that I would update her after my upper endoscopy and that she would likely hear from critical care medicine as his hospitalization progresses.  I let her know his current hospital location.  Time provided for questions and answers and she thanked me for the call

## 2022-07-30 NOTE — Progress Notes (Signed)
An USGPIV (ultrasound guided PIV) has been placed for short-term vasopressor infusion. A correctly placed ivWatch must be used when administering Vasopressors. Should this treatment be needed beyond 72 hours, central line access should be obtained.  It will be the responsibility of the bedside nurse to follow best practice to prevent extravasations.   

## 2022-07-30 NOTE — Progress Notes (Addendum)
NAME:  Dillon Macdonald, MRN:  [id number], DOB:  December 28, 1970, LOS: 1 ADMISSION DATE:  07/29/2022, CONSULTATION DATE:  4/17 REFERRING MD:  Dr. Criss Alvine, CHIEF COMPLAINT:  gi bleed   History of Present Illness:  Patient is a 52 yo M w/ pertinent PMH of recent reck surgery (Left C7-T1 laminotomies/foraminotomies) discharged 4/16 taking ibuprofen admitted to [facility] ED on 4/17 w/ GI bleed.   Patient had cervical radiculopathy and came to [facility] for elective surgery on 4/15. Patient received Left C7-T1 laminotomies/foraminotomies. Discharged on 4/16 and has been taking ibuprofen for pain. On 4/17 patient woke up from nap diaphoretic and felt acutely ill. Patient went to bathroom and passed out. He had multiple black stools and vomited bright red blood to the point "there was a blood bath". EMS called and transported patient to [facility] Ed. No hx of GI bleed or liver disease, alcohol abuse.  Upon arrival to [facility], patient ill appearing and hypotensive with sbp 60-70s. Patient complaining of abdominal tenderness. Patient type and screened and was given 1 unit PRBC w/ improvement in BP. CBC showing hgb 8.5. Patient started on PPI. PCCM consulted.    Pertinent  Medical History   Past Medical History:  Diagnosis Date   Arthritis    Atypical chest pain 01/17/2016   Back pain    Herpes simplex    Hypogonadism male    Shortness of breath    feels that way all the time, ? pain or Percocet   Thrombocytopenia 2014   Tobacco abuse 05/18/2016     Significant Hospital Events: Including procedures, antibiotic start and stop dates in addition to other pertinent events   4/17 admitted w/ gi bleed, initial blood pressure was systolic in the 70s.  Hemoglobin 8.8, down from 15.  Transfused.  Intubated for airway protection.  Gastroenterology consulted.  EGD completed: Normal esophagus.  Large amount of food in both black as well as fresh clotted blood in the gastric fundus and gastric body limiting visualization in the proximal  stomach.  Some erosive changes in the prepyloric area.  He underwent more than 90 minutes with countless passes of the endoscopy scope to clear the stomach of clots and food using a retrieval net and effort to achieve desired visualization.  It was felt there was no active bleeding at that point however source of bleeding could not be seen because there is still adherent black material and scattered food debris which could not be completely cleared.  He was sent back to the intensive care intubated and sedated with plan to continue PPI infusion and repeat upper endoscopy on 4/18. 4/18 agitated on mechanical ventilation requiring sedation.  Demonstrating voiding residual of greater than a liter required straight cath time X 1 for one liter. In a.m. still with significant fluid in bladder so Foley catheter placed.  Interim History / Subjective:  -Sedated on ventilator  Objective   Blood pressure (Abnormal) 96/58, pulse 72, temperature 99 F (37.2 C), temperature source Axillary, resp. rate 10, weight 80.6 kg, SpO2 100 %.    Vent Mode: PRVC FiO2 (%):  [40 %-100 %] 40 % Set Rate:  [16 bmp-20 bmp] 20 bmp Vt Set:  [560 mL] 560 mL PEEP:  [5 cmH20] 5 cmH20 Plateau Pressure:  [12 cmH20-18 cmH20] 18 cmH20   Intake/Output Summary (Last 24 hours) at 07/30/2022 0730 Last data filed at 07/30/2022 0700 Gross per 24 hour  Intake 2592.72 ml  Output 975 ml  Net 1617.72 ml   [employer]  07/30/22 0422  Weight: 80.6 kg    Examination: General otherwise healthy appearing 52 year old white male currently sedated on propofol and fentanyl infusion HEENT orally intubated, scleral nonicteric, atraumatic Pulmonary: Clear to auscultation equal chest rise minimal ventilator support PCXR from postintubation on 4/18, endotracheal tube in satisfactory position cervical hardware noted lungs clear Cardiac: Regular rate and rhythm without murmur rub or gallop Abdomen: Soft nontender no organomegaly Extremities:  Warm dry with brisk capillary refill Neuro: Currently sedated. GU: Over 500 cc of residual urine in bladder by bladder scan  Assessment & Plan:    Acute GIB with acute blood loss anemia- presumed upper,H&H stable overnight Plan Continuing PPI infusion N.p.o. Repeat EGD today Avoid NSAIDs Hold home aspirin  Need for mechanical ventilation and airway protection Plan Continue full ventilator support VAP bundle PAD protocol RASS goal -2, once cleared by gastroenterology we will initiate SBT trial  Circulatory shock.  Initially hypovolemia.  At this point ongoing hypotension likely more sedation related Plan Continue to trend H&H Titrate norepinephrine for mean arterial pressure greater than 65 Sedation titration as noted above  Fluid and electrolyte imbalance: Hypocalcemia.  This was replaced. Plan Follow-up chemistry this morning   Hx Cervical Radiculopathy - s/p C7 and T1 laminotomies and foraminotomies with microdiscectomy (Dr. Jordan Likes 07/27/22) Plan  f/u with neurosurgery as outpatient. Hold home flexeril and gabapentin.  Hx of anxiety. Plan Hold buspar for now, resume after extubation  Best Practice (right click and "Reselect all SmartList Selections" daily)   Diet/type: NPO DVT prophylaxis: SCD GI prophylaxis: PPI Lines: N/A Foley:  N/A Code Status:  full code Last date of multidisciplinary goals of care discussion [pending] Daughter updated at length at bedside  Critical care time: 34 minutes    07/30/2022, 7:30 AM

## 2022-07-30 NOTE — Op Note (Signed)
[facility] Patient Name: Dillon Macdonald Procedure Date : 07/30/2022 MRN: [id number] Attending MD: Beverley Fiedler , MD, [id number] Date of Birth: Nov 29, 1970 CSN: [id number] Age: 52 Admit Type: Inpatient Procedure:                Upper GI endoscopy Indications:              Acute post hemorrhagic anemia, Hematemesis, Melena,                            Active gastrointestinal bleeding, EGD 12 hours                            before with blood and food obscuring views Providers:                Carie Caddy. Rhea Belton, MD, Joannie [place], Technician Referring MD:             Chilton Greathouse, MD, PCCM Medicines:                Total IV Anesthesia (TIVA) per ICU team (fentanyl                            and propofol gtt) Complications:            No immediate complications. Estimated Blood Loss:     Estimated blood loss: none. Procedure:                Pre-Anesthesia Assessment:                           - Prior to the procedure, a History and Physical                            was performed, and patient medications and                            allergies were reviewed. The patient's tolerance of                            previous anesthesia was also reviewed. The risks                            and benefits of the procedure and the sedation                            options and risks were discussed with the patient.                            All questions were answered, and informed consent                            was obtained. Prior Anticoagulants: The patient has                            taken no anticoagulant or antiplatelet agents. ASA  Grade Assessment: III - A patient with severe                            systemic disease. After reviewing the risks and                            benefits, the patient was deemed in satisfactory                            condition to undergo the procedure.                           After obtaining informed  consent, the endoscope was                            passed under direct vision. Throughout the                            procedure, the patient's blood pressure, pulse, and                            oxygen saturations were monitored continuously. The                            [id number]-[id number] ([id number]) Olympus endoscope was introduced                            through the mouth, and advanced to the second part                            of duodenum. The upper GI endoscopy was                            accomplished without difficulty. The patient                            tolerated the procedure well. Scope In: Scope Out: Findings:      The examined esophagus was normal.      A Dieulafoy lesion with no bleeding and stigmata (visible vessel) of       recent bleeding was found on the anterior wall of the gastric body. Area       around the lesion was successfully injected with 3 mL of a 0.1 mg/mL       solution of epinephrine for hemostasis with good effect. Coagulation for       hemostasis using monopolar gold probe was successful.      The exam of the stomach was otherwise normal.      The examined duodenum was normal. Impression:               - Normal esophagus.                           - Dieulafoy lesion of stomach. Injected and  cauterized.                           - Normal examined duodenum.                           - No specimens collected. Moderate Sedation:      N/A Recommendation:           - Return patient to ICU for ongoing care.                           - Advance diet as tolerated.                           - Continue present medications. PPI gtt x 24-48                            hours, then BID for 4 weeks.                           - We will check on him tomorrow.                           - Discussed with his mother by phone after the                            procedure. Procedure Code(s):        --- Professional ---                            [phone], Esophagogastroduodenoscopy, flexible,                            transoral; with control of bleeding, any method Diagnosis Code(s):        --- Professional ---                           K31.82, Dieulafoy lesion (hemorrhagic) of stomach                            and duodenum                           D62, Acute posthemorrhagic anemia                           K92.0, Hematemesis                           K92.1, Melena (includes Hematochezia)                           K92.2, Gastrointestinal hemorrhage, unspecified CPT copyright 2022 American [employer] Association. All rights reserved. The codes documented in this report are preliminary and upon coder review may  be revised to meet current compliance requirements. Beverley Fiedler, MD 07/30/2022 1:46:49 PM This report has been signed electronically. Number of Addenda: 0

## 2022-07-30 NOTE — Progress Notes (Signed)
EGD completed. Placed on pressure support ventilation.  Fairly restless however when not agitated able to pull in excess of 700 cc tidal volume. Plan Discontinue all sedation and extubate  Simonne Martinet ACNP-BC [facility] Pulmonary/Critical Care Pager # [phone] OR # [phone] if no answer

## 2022-07-30 NOTE — Progress Notes (Signed)
Pharmacy Electrolyte Replacement  Recent Labs:  Recent Labs    07/30/22 0752  K 4.0  MG 1.7  PHOS 2.9  CREATININE 0.99    Low Critical Values (K </= 2.5, Phos </= 1, Mg </= 1) Present: None  Plan: Give 2g mag IV x 1. Recheck in AM.   Estill Batten, PharmD, BCCCP

## 2022-07-30 NOTE — Progress Notes (Signed)
eLink Physician-Brief Progress Note Patient Name: Dillon Macdonald DOB: [birth year] MRN: [id number]   Date of Service  07/30/2022  HPI/Events of Note  Patient has difficulty voiding.  Bladder scan with greater than 1 L residual.  eICU Interventions  Straight cath patient.        Carilyn Goodpasture 07/30/2022, 1:51 AM

## 2022-07-31 DIAGNOSIS — K254 Chronic or unspecified gastric ulcer with hemorrhage: Secondary | ICD-10-CM | POA: Diagnosis not present

## 2022-07-31 LAB — BASIC METABOLIC PANEL
Anion gap: 9 (ref 5–15)
BUN: 14 mg/dL (ref 6–20)
CO2: 27 mmol/L (ref 22–32)
Calcium: 7.9 mg/dL — ABNORMAL LOW (ref 8.9–10.3)
Chloride: 100 mmol/L (ref 98–111)
Creatinine, Ser: 0.89 mg/dL (ref 0.61–1.24)
GFR, Estimated: >60 mL/min (ref >60)
Glucose, Bld: 95 mg/dL (ref 70–99)
Potassium: 3.9 mmol/L (ref 3.5–5.1)
Sodium: 136 mmol/L (ref 135–145)

## 2022-07-31 LAB — MAGNESIUM: Magnesium: 1.9 mg/dL (ref 1.7–2.4)

## 2022-07-31 LAB — HEMOGLOBIN: Hemoglobin: 7.9 g/dL — ABNORMAL LOW (ref 13.0–17.0)

## 2022-07-31 LAB — BPAM PLATELET PHERESIS: Blood Product Expiration Date: 202404192359

## 2022-07-31 LAB — PREPARE PLATELET PHERESIS

## 2022-07-31 MED ORDER — TAMSULOSIN HCL 0.4 MG PO CAPS
0.4000 mg | ORAL_CAPSULE | Freq: Every day | ORAL | Status: DC
  Administered 2022-07-31 – 2022-08-04 (×5): 0.4 mg via ORAL
  Filled 2022-07-31 (×5): qty 1

## 2022-07-31 MED ORDER — HYDROCODONE-ACETAMINOPHEN 7.5-325 MG PO TABS
1.0000 | ORAL_TABLET | Freq: Four times a day (QID) | ORAL | Status: DC | PRN
  Administered 2022-07-31: 2 via ORAL
  Administered 2022-07-31 (×2): 1 via ORAL
  Administered 2022-08-01: 2 via ORAL
  Administered 2022-08-01: 1 via ORAL
  Filled 2022-07-31: qty 2
  Filled 2022-07-31: qty 1
  Filled 2022-07-31: qty 2
  Filled 2022-07-31: qty 1
  Filled 2022-07-31: qty 2

## 2022-07-31 MED ORDER — CYCLOBENZAPRINE HCL 10 MG PO TABS
10.0000 mg | ORAL_TABLET | Freq: Three times a day (TID) | ORAL | Status: DC | PRN
  Administered 2022-07-31 – 2022-08-04 (×8): 10 mg via ORAL
  Filled 2022-07-31 (×9): qty 1

## 2022-07-31 MED ORDER — FENTANYL CITRATE PF 50 MCG/ML IJ SOSY
12.5000 ug | PREFILLED_SYRINGE | INTRAMUSCULAR | Status: DC | PRN
  Administered 2022-07-31 – 2022-08-01 (×3): 12.5 ug via INTRAVENOUS
  Filled 2022-07-31 (×3): qty 1

## 2022-07-31 NOTE — Progress Notes (Addendum)
PT Cancellation Note  Patient Details Name: KENYAN KARNES MRN: [id number] DOB: April 10, 1971   Cancelled Treatment:    Reason Eval/Treat Not Completed: Pain limiting ability to participate;Fatigue/lethargy limiting ability to participate Pt with c/o cervical pain and severe headache this AM. Pain meds received. Pt now sleeping soundly. Unable to rouse for participation in therapy eval. PT to re-attempt as time allows.    Ilda Foil 07/31/2022, 10:08 AM  Addendum 1148: Re-attempted PT eval. Pt sleeping and unable to fully rouse. RN reports pt was recently awake and conversing. Now pt keeping eyes closed, mumbling incoherently with attempts to wake him. Handout regarding mobility after cervical sx left on pt bedside table to reinforce education provided after cervical procedure 4/15. PT to continue efforts.  Ferd Glassing., PT  Office # [phone]

## 2022-07-31 NOTE — Progress Notes (Addendum)
Patient complained of splitting headache, and complained he can hear blood flow like swishing sound in his ears. Vitals within normal, NIH scale completed. Pt is weak on left side, that's his baseline before surgery per patient. Paged Pola Corn, talked to elink RN. Left call back number to reach out. PRN fentanyl administered. Notified Elink RN that patient pain has not been controlled with fentanyl. Patient stated he he could get to sleep some, headache will be better. Awaiting call back. Plan of care continues.

## 2022-07-31 NOTE — Progress Notes (Signed)
[facility] Progress Note  CC:  UGI bleed  Subjective: Complaining of an awful headache this morning.  I did tell him I would pass this along to the hospitalist team.  Otherwise no sign of active ongoing GI bleeding.  Objective:  Vital signs in last 24 hours: Temp:  [98.2 F (36.8 C)-99.4 F (37.4 C)] 99.1 F (37.3 C) (04/19 0750) Pulse Rate:  [62-130] 86 (04/19 0750) Resp:  [0-20] 18 (04/19 0750) BP: (92-128)/(53-94) 121/80 (04/19 0750) SpO2:  [76 %-100 %] 93 % (04/19 0750) FiO2 (%):  [40 %] 40 % (04/18 1142) Weight:  [84.2 kg] 84.2 kg (04/19 0648) Last BM Date :  (PTA) General:  Alert, Well-developed, in NAD, but appears uncomfortable due to headache Heart:  Regular rate and rhythm; no murmurs Pulm:  CTAB.  No W/R/R. Abdomen:  Soft, non-distended.  BS present.  Non-tender. Extremities:  Without edema. Neurologic:  Alert and oriented x 4;  grossly normal neurologically. Psych:  Alert and cooperative. Normal mood and affect.  Intake/Output from previous day: 04/18 0701 - 04/19 0700 In: 3051.7 [P.O.:680; I.V.:2366.1; IV Piggyback:5.6] Out: 4380 [Urine:4380]  Lab Results: Recent Labs    07/29/22 1733 07/29/22 1737 07/30/22 0752 07/30/22 1411 07/30/22 1757  WBC 9.9  --  5.7  --   --   HGB 8.8*   < > 8.6* 9.1* 9.0*  HCT 28.0*   < > 25.6* 26.3* 25.8*  PLT 135*  --  103*  --   --    < > = values in this interval not displayed.   BMET Recent Labs    07/29/22 1733 07/29/22 1737 07/29/22 2335 07/30/22 0752 07/31/22 0131  NA 137 138 136 140 136  K 4.2 4.3 6.0* 4.0 3.9  CL 106 103  --  107 100  CO2 24  --   --  26 27  GLUCOSE 169* 167*  --  104* 95  BUN 44* 42*  --  26* 14  CREATININE 1.03 1.00  --  0.99 0.89  CALCIUM 7.9*  --   --  7.9* 7.9*   LFT Recent Labs    07/29/22 1733  PROT 4.6*  ALBUMIN 2.9*  AST 20  ALT 14  ALKPHOS 34*  BILITOT 0.4   PT/INR Recent Labs    07/29/22 1733  LABPROT 15.0  INR 1.2   DG CHEST PORT 1  VIEW  Result Date: 07/29/2022 CLINICAL DATA:  Intubation EXAM: PORTABLE CHEST 1 VIEW COMPARISON:  05/21/2022 FINDINGS: Endotracheal tube terminates 4.5 cm above the carina. Lungs are clear No pleural effusion or pneumothorax. The heart is normal in size. Cervical spine fixation hardware. IMPRESSION: Endotracheal tube terminates 4.5 cm above the carina. Electronically Signed   By: Charline Bills M.D.   On: 07/29/2022 20:46    Assessment / Plan: Hematemesis Melena Acute blood loss anemia  EGD overnight 4/17 with lots of food and blood in the stomach, unable to visualize source.  Given some Reglan and repeat EGD 4/18 showed a Dieulafoy lesion that was injected and cauterized.  Hemoglobin not checked this morning so I will place order for one now.  BUN has normalized.  No evidence of active ongoing bleeding clinically.  Will give full liquid diet now and then may be able to advance to soft diet later today.  IV PPI drip for 24 to 48 hours and then p.o. twice daily for 1 month.     LOS: 2 days   [name]. Ammar Moffatt  07/31/2022, 10:58 AM

## 2022-07-31 NOTE — Plan of Care (Signed)

## 2022-07-31 NOTE — Progress Notes (Signed)
PROGRESS NOTE    Dillon Macdonald  BJY:[id number] DOB: 06-29-1970 DOA: 07/29/2022 PCP: Pcp, No  Outpatient Specialists:     Brief Narrative:  Patient is a 52 year old male with history of recent neck surgery on NSAIDs.  Patient was admitted with acute GI bleed.  EGD revealed Dieulafoy lesion that has been injected and cauterized.  No further hematemesis reported.  Hemoglobin is 7.9 g/dL today, down from 9 g/dL yesterday.  Patient is lethargic.  I believe patient just received fentanyl.  Urinary obstruction is also reported.   Assessment & Plan:   Principal Problem:   GI bleed Active Problems:   Hematemesis with nausea   Acute blood loss anemia   Melena   Gastric hemorrhage due to Dieulafoy lesion of stomach   Upper GI bleed: -Patient has not had EGD. -EGD revealed Dieulafoy lesion.   -S/p cauterization of the lesion. -Hemoglobin is 7.9 g/dL today. -Continue to monitor H/H. -No further hematemesis reported. -Continue PPI. -GI input is appreciated. -Counseled to quit NSAIDs.    Hx Cervical Radiculopathy - s/p C7 and T1 laminotomies and foraminotomies with microdiscectomy (Dr. Jordan Likes 07/27/22) - F/u with neurosurgery as outpatient. - Hold home flexeril and gabapentin. 07/31/2022: Optimize pain control.   Hx of anxiety.  DVT prophylaxis:  Code Status: Full code. Family Communication:  Disposition Plan: Home eventually   Consultants:  GI.  Procedures:  EGD. S/p intubation and extubation.  Antimicrobials:  None.   Subjective: -No further GI bleed.   Objective: Vitals:   07/31/22 0422 07/31/22 0648 07/31/22 0750 07/31/22 1100  BP: 115/71  121/80 117/65  Pulse: 94  86 94  Resp: Temp: 98.8 F (37.1 C)  99.1 F (37.3 C) 99 F (37.2 C)  TempSrc: Oral  Oral Oral  SpO2: 99%  93% 94%  Weight:  84.2 kg      Intake/Output Summary (Last 24 hours) at 07/31/2022 1230 Last data filed at 07/31/2022 1116 Gross per 24 hour  Intake 2705.29 ml  Output 4730  ml  Net -2024.71 ml   Filed Weights   07/30/22 0422 07/31/22 0648  Weight: 80.6 kg 84.2 kg    Examination:  General exam: Appears lethargic (patient just received fentanyl). Respiratory system: Clear to auscultation.  Cardiovascular system: S1 & S2 heard Gastrointestinal system: Abdomen is obese, soft and nontender. Central nervous system: Lethargic. Extremities: No leg edema.  Data Reviewed: I have personally reviewed following labs and imaging studies  CBC: Recent Labs  Lab 07/29/22 1733 07/29/22 1737 07/29/22 2335 07/30/22 0026 07/30/22 0752 07/30/22 1411 07/30/22 1757 07/31/22 1123  WBC 9.9  --   --   --  5.7  --   --   --   NEUTROABS 7.3  --   --   --   --   --   --   --   HGB 8.8*   < > 7.8* 9.5* 8.6* 9.1* 9.0* 7.9*  HCT 28.0*   < > 23.0* 26.7* 25.6* 26.3* 25.8*  --   MCV 94.9  --   --   --  88.3  --   --   --   PLT 135*  --   --   --  103*  --   --   --    < > = values in this interval not displayed.   Basic Metabolic Panel: Recent Labs  Lab 07/29/22 1733 07/29/22 1737 07/29/22 2335 07/30/22 0752 07/31/22 0131  NA 137 138 136 140 136  K 4.2 4.3 6.0* 4.0 3.9  CL 106 103  --  107 100  CO2 24  --   --  26 27  GLUCOSE 169* 167*  --  104* 95  BUN 44* 42*  --  26* 14  CREATININE 1.03 1.00  --  0.99 0.89  CALCIUM 7.9*  --   --  7.9* 7.9*  MG  --   --   --  1.7 1.9  PHOS  --   --   --  2.9  --    GFR: Estimated Creatinine Clearance: 100.4 mL/min (by C-G formula based on SCr of 0.89 mg/dL). Liver Function Tests: Recent Labs  Lab 07/29/22 1733  AST 20  ALT 14  ALKPHOS 34*  BILITOT 0.4  PROT 4.6*  ALBUMIN 2.9*   No results for input(s): "LIPASE", "AMYLASE" in the last 168 hours. No results for input(s): "AMMONIA" in the last 168 hours. Coagulation Profile: Recent Labs  Lab 07/29/22 1733  INR 1.2   Cardiac Enzymes: No results for input(s): "CKTOTAL", "CKMB", "CKMBINDEX", "TROPONINI" in the last 168 hours. BNP (last 3 results) No results for  input(s): "PROBNP" in the last 8760 hours. HbA1C: No results for input(s): "HGBA1C" in the last 72 hours. CBG: Recent Labs  Lab 07/29/22 2337 07/30/22 0305 07/30/22 0715 07/30/22 1129 07/30/22 1521  GLUCAP 127* 105* 98 81 98   Lipid Profile: Recent Labs    07/30/22 0752  TRIG 149   Thyroid Function Tests: No results for input(s): "TSH", "T4TOTAL", "FREET4", "T3FREE", "THYROIDAB" in the last 72 hours. Anemia Panel: No results for input(s): "VITAMINB12", "FOLATE", "FERRITIN", "TIBC", "IRON", "RETICCTPCT" in the last 72 hours. Urine analysis:    Component Value Date/Time   COLORURINE YELLOW 10/18/2017 1644   APPEARANCEUR CLEAR 10/18/2017 1644   LABSPEC 1.025 10/18/2017 1644   PHURINE 5.0 10/18/2017 1644   GLUCOSEU NEGATIVE 10/18/2017 1644   HGBUR NEGATIVE 10/18/2017 1644   BILIRUBINUR NEGATIVE 10/18/2017 1644   KETONESUR NEGATIVE 10/18/2017 1644   PROTEINUR NEGATIVE 10/18/2017 1644   NITRITE NEGATIVE 10/18/2017 1644   LEUKOCYTESUR NEGATIVE 10/18/2017 1644   Sepsis Labs: (procalcitonin:4,lacticidven:4)  ) Recent Results (from the past 240 hour(s))  Surgical pcr screen     Status: None   Collection Time: 07/21/22  1:03 PM   Specimen: Nasal Mucosa; Nasal Swab  Result Value Ref Range Status   MRSA, PCR NEGATIVE NEGATIVE Final   Staphylococcus aureus NEGATIVE NEGATIVE Final    Comment: (NOTE) The Xpert SA Assay (FDA approved for NASAL specimens in patients 71 years of age and older), is one component of a comprehensive surveillance program. It is not intended to diagnose infection nor to guide or monitor treatment. Performed at [facility], [address], [place], [place] [address]   MRSA Next Gen by PCR, Nasal     Status: None   Collection Time: 07/29/22  7:55 PM   Specimen: Nasal Mucosa; Nasal Swab  Result Value Ref Range Status   MRSA by PCR Next Gen NOT DETECTED NOT DETECTED Final    Comment: (NOTE) The GeneXpert MRSA Assay (FDA approved  for NASAL specimens only), is one component of a comprehensive MRSA colonization surveillance program. It is not intended to diagnose MRSA infection nor to guide or monitor treatment for MRSA infections. Test performance is not FDA approved in patients less than 49 years old. Performed at [facility], [address], [place], [place] [address]          Radiology  Studies: DG CHEST PORT 1 VIEW  Result Date: 07/29/2022 CLINICAL DATA:  Intubation EXAM: PORTABLE CHEST 1 VIEW COMPARISON:  05/21/2022 FINDINGS: Endotracheal tube terminates 4.5 cm above the carina. Lungs are clear No pleural effusion or pneumothorax. The heart is normal in size. Cervical spine fixation hardware. IMPRESSION: Endotracheal tube terminates 4.5 cm above the carina. Electronically Signed   By: Charline Bills M.D.   On: 07/29/2022 20:46        Scheduled Meds:  Chlorhexidine Gluconate Cloth  6 each Topical Q0600   mouth rinse  15 mL Mouth Rinse BID PC   Continuous Infusions:  sodium chloride Stopped (07/29/22 2145)   lactated ringers 75 mL/hr at 07/30/22 1800   pantoprazole 8 mg/hr (07/31/22 0805)     LOS: 2 days    Time spent: 35 minutes.    Berton Mount, MD  [facility] Pager #: [phone] 7PM-7AM contact night coverage as above

## 2022-07-31 NOTE — Progress Notes (Signed)
eLink Physician-Brief Progress Note Patient Name: Dillon Macdonald DOB: 1971-02-17 MRN: [id number]   Date of Service  07/31/2022  HPI/Events of Note  Pt is having surgical neck pain and then sudden severe headache that started about 1hr ago. Having "wave" sounds in his ears.   PRN fent given 2x this shift for neck pain but now w/ severe HA wondering if he needs something else. Bedside is giving him the fent dose now because of severe HA pain  Clear liquid diet.   eICU Interventions  Add home cyclobenzaprine  Add home longer acting  hydrocodone, use the fentanyl for breakthrough     Intervention Category Intermediate Interventions: Pain - evaluation and management  Nicolai Labonte 07/31/2022, 2:26 AM

## 2022-08-01 DIAGNOSIS — K922 Gastrointestinal hemorrhage, unspecified: Secondary | ICD-10-CM | POA: Diagnosis not present

## 2022-08-01 LAB — CBC WITH DIFFERENTIAL/PLATELET
Abs Immature Granulocytes: 0.01 10*3/uL (ref 0.00–0.07)
Basophils Absolute: 0 10*3/uL (ref 0.0–0.1)
Basophils Relative: 0 %
Eosinophils Absolute: 0.1 10*3/uL (ref 0.0–0.5)
Eosinophils Relative: 2 %
HCT: 23.5 % — ABNORMAL LOW (ref 39.0–52.0)
Hemoglobin: 8.1 g/dL — ABNORMAL LOW (ref 13.0–17.0)
Immature Granulocytes: 0 %
Lymphocytes Relative: 22 %
Lymphs Abs: 0.9 10*3/uL (ref 0.7–4.0)
MCH: 30.3 pg (ref 26.0–34.0)
MCHC: 34.5 g/dL (ref 30.0–36.0)
MCV: 88 fL (ref 80.0–100.0)
Monocytes Absolute: 0.3 10*3/uL (ref 0.1–1.0)
Monocytes Relative: 7 %
Neutro Abs: 3 10*3/uL (ref 1.7–7.7)
Neutrophils Relative %: 69 %
Platelets: 98 10*3/uL — ABNORMAL LOW (ref 150–400)
RBC: 2.67 MIL/uL — ABNORMAL LOW (ref 4.22–5.81)
RDW: 13.7 % (ref 11.5–15.5)
WBC: 4.3 10*3/uL (ref 4.0–10.5)
nRBC: 0 % (ref 0.0–0.2)

## 2022-08-01 MED ORDER — SUMATRIPTAN SUCCINATE 6 MG/0.5ML ~~LOC~~ SOLN
6.0000 mg | SUBCUTANEOUS | Status: DC | PRN
  Administered 2022-08-01: 6 mg via SUBCUTANEOUS
  Filled 2022-08-01 (×3): qty 0.5

## 2022-08-01 MED ORDER — HYDROCODONE-ACETAMINOPHEN 5-325 MG PO TABS
1.0000 | ORAL_TABLET | Freq: Four times a day (QID) | ORAL | Status: DC | PRN
  Administered 2022-08-01 – 2022-08-04 (×8): 1 via ORAL
  Filled 2022-08-01 (×8): qty 1

## 2022-08-01 MED ORDER — PANTOPRAZOLE SODIUM 40 MG PO TBEC
40.0000 mg | DELAYED_RELEASE_TABLET | Freq: Two times a day (BID) | ORAL | Status: DC
  Administered 2022-08-01 – 2022-08-04 (×6): 40 mg via ORAL
  Filled 2022-08-01 (×6): qty 1

## 2022-08-01 MED ORDER — ACETAMINOPHEN 325 MG PO TABS
650.0000 mg | ORAL_TABLET | Freq: Four times a day (QID) | ORAL | Status: DC | PRN
  Filled 2022-08-01: qty 2

## 2022-08-01 NOTE — Plan of Care (Signed)
  Problem: Activity: Goal: Ability to tolerate increased activity will improve Outcome: Progressing Goal: Will remain free from falls Outcome: Progressing   Problem: Clinical Measurements: Goal: Postoperative complications will be avoided or minimized Outcome: Progressing   Problem: Pain Management: Goal: Pain level will decrease Outcome: Progressing

## 2022-08-01 NOTE — Evaluation (Signed)
Physical Therapy Evaluation Patient Details Name: Dillon Macdonald MRN: [id number] DOB: Dec 05, 1970 Today's Date: 08/01/2022  History of Present Illness  52 y.o. male arrives in ED 4/18  via EMS after large volume hematemesis and melena and syncopal event, Found to be hypotensive in ED and EGD performed revealing acute GI bleed and endoscopic treatment of dieulafoy lesion, pt requiring short term intubation after EGD PMHx: s/p 4/15 microdiscectomy C7-T1  arthritis, herpes simplex, tobacco use, lumbar and cervical fusions  Clinical Impression  PTA pt living with dog in single story home with a few steps to enter. Pt had recent microdiscectomy, and prior to that was completely independent. Pt is limited in mobility today by headache and profound dizziness with movement. Vestibular screen reveals L nystagmus and disconjugate gaze at medial end range. Pt will order formal Vestibular Exam. Pt is min guard for mobility with RW. Pt would benefit from HHPT for balance deficits at discharge. PT will continue to follow acutely.      Recommendations for follow up therapy are one component of a multi-disciplinary discharge planning process, led by the attending physician.  Recommendations may be updated based on patient status, additional functional criteria and insurance authorization.     Assistance Recommended at Discharge Intermittent Supervision/Assistance  Patient can return home with the following  Assist for transportation;Assistance with cooking/housework    Equipment Recommendations None recommended by PT     Functional Status Assessment Patient has had a recent decline in their functional status and demonstrates the ability to make significant improvements in function in a reasonable and predictable amount of time.     Precautions / Restrictions Precautions Precautions: Fall;Cervical;Back Precaution Comments: reviewed cervical precautions Restrictions Weight Bearing Restrictions: No       Mobility  Bed Mobility Overal bed mobility: Modified Independent             General bed mobility comments: reminder for spinal precautions    Transfers Overall transfer level: Modified independent   Transfers: Sit to/from Stand Sit to Stand: Modified independent (Device/Increase time)           General transfer comment: slight unsteadiness without DME    Ambulation/Gait Ambulation/Gait assistance: Min guard Gait Distance (Feet): 20 Feet Assistive device: Rolling walker (2 wheels) Gait Pattern/deviations: Step-through pattern, Shuffle Gait velocity: slowed Gait velocity interpretation: <1.31 ft/sec, indicative of household ambulator   General Gait Details: min guard for safety, mild unsteadiness, no overt LoB, obvious dizziness      Balance Overall balance assessment: Needs assistance Sitting-balance support: Feet supported Sitting balance-Leahy Scale: Good     Standing balance support: Bilateral upper extremity supported, No upper extremity supported, During functional activity Standing balance-Leahy Scale: Fair Standing balance comment: fair static standing benefits from DME support for dynamic activity                             Pertinent Vitals/Pain Pain Assessment Pain Assessment: 0-10 Pain Score: 4  Pain Location: headache Pain Descriptors / Indicators: Throbbing, Aching Pain Intervention(s): Limited activity within patient's tolerance, Monitored during session, Repositioned    Home Living Family/patient expects to be discharged to:: Private residence Living Arrangements: Alone Available Help at Discharge: Family;Neighbor;Available PRN/intermittently Type of Home: House Home Access: Stairs to enter   Entrance Stairs-Number of Steps: 3   Home Layout: One level Home Equipment: Rollator (4 wheels)      Prior Function Prior Level of Function : Independent/Modified Independent;Driving;Working/employed  Mobility  Comments: indep ADLs Comments: works, indep.     Hand Dominance   Dominant Hand: Right    Extremity/Trunk Assessment   Upper Extremity Assessment Upper Extremity Assessment: Defer to OT evaluation    Lower Extremity Assessment Lower Extremity Assessment: Overall WFL for tasks assessed    Cervical / Trunk Assessment Cervical / Trunk Assessment: Back Surgery;Neck Surgery  Communication   Communication: No difficulties  Cognition Arousal/Alertness: Awake/alert Behavior During Therapy: WFL for tasks assessed/performed Overall Cognitive Status: Within Functional Limits for tasks assessed                                          General Comments General comments (skin integrity, edema, etc.): Pt reports hitting his head on the tub when he was syncopal prior to admission. Vestibular screen performed, dizziness and nausea one beat nystagmus with finger follow to R, increased dizziness and disconjugate gaze in L eye with with slight cervical rotation to the R. Ordered PT Vestibular exam        Assessment/Plan    PT Assessment Patient needs continued PT services  PT Problem List Decreased balance;Decreased mobility;Decreased activity tolerance;Pain       PT Treatment Interventions Gait training;Stair training;Functional mobility training;Therapeutic activities;Therapeutic exercise;Balance training;Neuromuscular re-education;Patient/family education    PT Goals (Current goals can be found in the Care Plan section)  Acute Rehab PT Goals Patient Stated Goal: have less dizziness PT Goal Formulation: With patient Time For Goal Achievement: 08/15/22 Potential to Achieve Goals: Fair    Frequency Min 1X/week        AM-PAC PT "6 Clicks" Mobility  Outcome Measure Help needed turning from your back to your side while in a flat bed without using bedrails?: None Help needed moving from lying on your back to sitting on the side of a flat bed without using bedrails?:  None Help needed moving to and from a bed to a chair (including a wheelchair)?: None Help needed standing up from a chair using your arms (e.g., wheelchair or bedside chair)?: None Help needed to walk in hospital room?: None Help needed climbing 3-5 steps with a railing? : A Little 6 Click Score: 23    End of Session   Activity Tolerance: Patient limited by pain (headache and dizziness) Patient left: in bed;with call bell/phone within reach Nurse Communication: Mobility status PT Visit Diagnosis: BPPV    Time: 1610-9604 PT Time Calculation (min) (ACUTE ONLY): 22 min   Charges:   PT Evaluation $PT Eval Moderate Complexity: 1 Mod          Jabarri Stefanelli B. Beverely Risen PT, DPT Acute Rehabilitation Services Please use secure chat or  Call Office [phone]   Elon Alas [facility] 08/01/2022, 3:04 PM

## 2022-08-01 NOTE — Progress Notes (Signed)
PROGRESS NOTE    Dillon Macdonald  UEA:[id number] DOB: 1970/07/03 DOA: 07/29/2022 PCP: Pcp, No  Outpatient Specialists:     Brief Narrative:  Patient is a 52 year old male with history of recent neck surgery on NSAIDs.  Patient was admitted with acute GI bleed.  EGD revealed Dieulafoy lesion that has been injected and cauterized.  No further hematemesis reported.  Hemoglobin is 7.9 g/dL today, down from 9 g/dL yesterday.  Patient is lethargic.  I believe patient just received fentanyl.  Urinary obstruction is also reported.  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 -EGD revealed Dieulafoy lesion.   -S/p cauterization of the lesion. -Hemoglobin is 7.9 g/dL today. -Continue to monitor H/H. -No further hematemesis reported. -Continue PPI. -GI input is appreciated. -Counseled to quit NSAIDs.    Hx Cervical Radiculopathy - s/p C7 and T1 laminotomies and foraminotomies with microdiscectomy (Dr. Jordan Likes 07/27/22) - F/u with neurosurgery as outpatient. - Hold home flexeril and gabapentin. 07/31/2022: Optimize pain control.   Hx of anxiety.  Migraine like headache:  DVT prophylaxis:  Code Status: Full code. Family Communication:  Disposition Plan: Home eventually   Consultants:  GI.  Procedures:  EGD. S/p intubation and extubation.  Antimicrobials:  None.   Subjective: -No further GI bleed.   Objective: Vitals:   07/31/22 2252 07/31/22 2320 08/01/22 0549 08/01/22 0843  BP: 110/80 116/72  101/71  Pulse: 82 80  77  Resp: Temp:  98.4 F (36.9 C)  98 F (36.7 C)  TempSrc:  Oral  Oral  SpO2: 96% 94%  97%  Weight:   77.9 kg     Intake/Output Summary (Last 24 hours) at 08/01/2022  1142 Last data filed at 08/01/2022 0847 Gross per 24 hour  Intake --  Output 5500 ml  Net -5500 ml    Filed Weights   07/30/22 0422 07/31/22 0648 08/01/22 0549  Weight: 80.6 kg 84.2 kg 77.9 kg    Examination:  General exam: Appears lethargic (patient just received fentanyl). Respiratory system: Clear to auscultation.  Cardiovascular system: S1 & S2 heard Gastrointestinal system: Abdomen is obese, soft and nontender. Central nervous system: Lethargic. Extremities: No leg edema.  Data Reviewed: I have personally reviewed following labs and imaging studies  CBC: Recent Labs  Lab 07/29/22 1733 07/29/22 1737 07/30/22 0026 07/30/22 0752 07/30/22 1411 07/30/22 1757 07/31/22 1123 08/01/22 0128  WBC 9.9  --   --  5.7  --   --   --  4.3  NEUTROABS 7.3  --   --   --   --   --   --  3.0  HGB 8.8*   < > 9.5* 8.6* 9.1* 9.0* 7.9* 8.1*  HCT 28.0*   < > 26.7* 25.6* 26.3* 25.8*  --  23.5*  MCV 94.9  --   --  88.3  --   --   --  88.0  PLT 135*  --   --  103*  --   --   --  98*   < > = values in this interval not displayed.    Basic Metabolic Panel: Recent Labs  Lab 07/29/22 1733 07/29/22 1737 07/29/22 2335  07/30/22 0752 07/31/22 0131  NA 137 138 136 140 136  K 4.2 4.3 6.0* 4.0 3.9  CL 106 103  --  107 100  CO2 24  --   --  26 27  GLUCOSE 169* 167*  --  104* 95  BUN 44* 42*  --  26* 14  CREATININE 1.03 1.00  --  0.99 0.89  CALCIUM 7.9*  --   --  7.9* 7.9*  MG  --   --   --  1.7 1.9  PHOS  --   --   --  2.9  --     GFR: Estimated Creatinine Clearance: 100.4 mL/min (by C-G formula based on SCr of 0.89 mg/dL). Liver Function Tests: Recent Labs  Lab 07/29/22 1733  AST 20  ALT 14  ALKPHOS 34*  BILITOT 0.4  PROT 4.6*  ALBUMIN 2.9*    No results for input(s): "LIPASE", "AMYLASE" in the last 168 hours. No results for input(s): "AMMONIA" in the last 168 hours. Coagulation Profile: Recent Labs  Lab 07/29/22 1733  INR 1.2    Cardiac Enzymes: No results for  input(s): "CKTOTAL", "CKMB", "CKMBINDEX", "TROPONINI" in the last 168 hours. BNP (last 3 results) No results for input(s): "PROBNP" in the last 8760 hours. HbA1C: No results for input(s): "HGBA1C" in the last 72 hours. CBG: Recent Labs  Lab 07/29/22 2337 07/30/22 0305 07/30/22 0715 07/30/22 1129 07/30/22 1521  GLUCAP 127* 105* 98 81 98    Lipid Profile: Recent Labs    07/30/22 0752  TRIG 149    Thyroid Function Tests: No results for input(s): "TSH", "T4TOTAL", "FREET4", "T3FREE", "THYROIDAB" in the last 72 hours. Anemia Panel: No results for input(s): "VITAMINB12", "FOLATE", "FERRITIN", "TIBC", "IRON", "RETICCTPCT" in the last 72 hours. Urine analysis:    Component Value Date/Time   COLORURINE YELLOW 10/18/2017 1644   APPEARANCEUR CLEAR 10/18/2017 1644   LABSPEC 1.025 10/18/2017 1644   PHURINE 5.0 10/18/2017 1644   GLUCOSEU NEGATIVE 10/18/2017 1644   HGBUR NEGATIVE 10/18/2017 1644   BILIRUBINUR NEGATIVE 10/18/2017 1644   KETONESUR NEGATIVE 10/18/2017 1644   PROTEINUR NEGATIVE 10/18/2017 1644   NITRITE NEGATIVE 10/18/2017 1644   LEUKOCYTESUR NEGATIVE 10/18/2017 1644   Sepsis Labs: @LABRCNTIP (procalcitonin:4,lacticidven:4)  ) Recent Results (from the past 240 hour(s))  MRSA Next Gen by PCR, Nasal     Status: None   Collection Time: 07/29/22  7:55 PM   Specimen: Nasal Mucosa; Nasal Swab  Result Value Ref Range Status   MRSA by PCR Next Gen NOT DETECTED NOT DETECTED Final    Comment: (NOTE) The GeneXpert MRSA Assay (FDA approved for NASAL specimens only), is one component of a comprehensive MRSA colonization surveillance program. It is not intended to diagnose MRSA infection nor to guide or monitor treatment for MRSA infections. Test performance is not FDA approved in patients less than 73 years old. Performed at [facility], [address] [place], [place] [address]          Radiology Studies: No results found.      Scheduled Meds:   Chlorhexidine Gluconate Cloth  6 each Topical Q0600   mouth rinse  15 mL Mouth Rinse BID PC   pantoprazole  40 mg Oral BID   tamsulosin  0.4 mg Oral Daily   Continuous Infusions:  sodium chloride Stopped (07/29/22 2145)   lactated ringers 75 mL/hr at 07/31/22 1322     LOS: 3 days    Time spent: 35 minutes.    Berton Mount,  MD  [facility] Pager #: [phone] 7PM-7AM contact night coverage as above

## 2022-08-01 NOTE — Progress Notes (Signed)
Pt is very frustrated and said he was told he did not need to be on telemetry.  The telemetry electrodes and telemetry wires are not present.  He is frustrated and does not want to wear the telemetry.  Notified Dr. Dartha Lodge. He said place a note and discontinue.

## 2022-08-02 DIAGNOSIS — K922 Gastrointestinal hemorrhage, unspecified: Secondary | ICD-10-CM | POA: Diagnosis not present

## 2022-08-02 LAB — CBC WITH DIFFERENTIAL/PLATELET
Abs Immature Granulocytes: 0.01 10*3/uL (ref 0.00–0.07)
Basophils Absolute: 0 10*3/uL (ref 0.0–0.1)
Basophils Relative: 0 %
Eosinophils Absolute: 0.2 10*3/uL (ref 0.0–0.5)
Eosinophils Relative: 4 %
HCT: 23.7 % — ABNORMAL LOW (ref 39.0–52.0)
Hemoglobin: 8 g/dL — ABNORMAL LOW (ref 13.0–17.0)
Immature Granulocytes: 0 %
Lymphocytes Relative: 22 %
Lymphs Abs: 0.9 10*3/uL (ref 0.7–4.0)
MCH: 30.2 pg (ref 26.0–34.0)
MCHC: 33.8 g/dL (ref 30.0–36.0)
MCV: 89.4 fL (ref 80.0–100.0)
Monocytes Absolute: 0.3 10*3/uL (ref 0.1–1.0)
Monocytes Relative: 7 %
Neutro Abs: 2.7 10*3/uL (ref 1.7–7.7)
Neutrophils Relative %: 67 %
Platelets: 113 10*3/uL — ABNORMAL LOW (ref 150–400)
RBC: 2.65 MIL/uL — ABNORMAL LOW (ref 4.22–5.81)
RDW: 13.7 % (ref 11.5–15.5)
WBC: 4 10*3/uL (ref 4.0–10.5)
nRBC: 0 % (ref 0.0–0.2)

## 2022-08-02 MED ORDER — SUMATRIPTAN SUCCINATE 6 MG/0.5ML ~~LOC~~ SOLN
6.0000 mg | SUBCUTANEOUS | Status: DC | PRN
  Administered 2022-08-02: 6 mg via SUBCUTANEOUS
  Filled 2022-08-02 (×2): qty 0.5

## 2022-08-02 NOTE — Progress Notes (Addendum)
Physical Therapy Treatment Patient Details Name: Dillon Macdonald MRN: [id number] DOB: [birth year] Today's Date: 08/02/2022   History of Present Illness 52 y.o. male arrives in ED 4/18  via EMS after large volume hematemesis and melena and syncopal event, Found to be hypotensive in ED and EGD performed revealing acute GI bleed and endoscopic treatment of dieulafoy lesion, pt requiring short term intubation after EGD PMHx: s/p 4/15 microdiscectomy C7-T1  arthritis, herpes simplex, tobacco use, lumbar and cervical fusions    PT Comments    Pt reporting dizziness/"spinning" sensation, tinnitus, and headache as constant and only relieved with lying down or sleeping. Pt with left upbeating nystagmus during smooth pursuits. Positive Dix Hallpike to left, and subsequently performed Epley maneuver, however, pt reporting no change following treatment. Pt ambulating 150 ft with a walker at a min guard assist level. Encouraged and instructed on gaze stabilization throughout. D/c plan remains appropriate.    Recommendations for follow up therapy are one component of a multi-disciplinary discharge planning process, led by the attending physician.  Recommendations may be updated based on patient status, additional functional criteria and insurance authorization.  Follow Up Recommendations       Assistance Recommended at Discharge Intermittent Supervision/Assistance  Patient can return home with the following Assist for transportation;Assistance with cooking/housework   Equipment Recommendations  None recommended by PT    Recommendations for Other Services       Precautions / Restrictions Precautions Precautions: Fall;Cervical;Back Restrictions Weight Bearing Restrictions: No     Mobility  Bed Mobility Overal bed mobility: Modified Independent             General bed mobility comments: reminder for spinal precautions    Transfers Overall transfer level: Modified independent   Transfers:  Sit to/from Stand             General transfer comment: slight unsteadiness without DME    Ambulation/Gait Ambulation/Gait assistance: Min guard Gait Distance (Feet): 150 Feet Assistive device: Rolling walker (2 wheels), None Gait Pattern/deviations: Step-through pattern, Decreased stride length Gait velocity: slowed     General Gait Details: Mild unsteadiness, improved anterior/posterior sway with RW vs wtihout   Stairs             Wheelchair Mobility    Modified Rankin (Stroke Patients Only)       Balance Overall balance assessment: Needs assistance Sitting-balance support: Feet supported Sitting balance-Leahy Scale: Good     Standing balance support: Bilateral upper extremity supported, No upper extremity supported, During functional activity Standing balance-Leahy Scale: Fair Standing balance comment: fair static standing benefits from DME support for dynamic activity                            Cognition Arousal/Alertness: Awake/alert Behavior During Therapy: WFL for tasks assessed/performed Overall Cognitive Status: Within Functional Limits for tasks assessed                                          Exercises Other Exercises Other Exercises: Pt with left upbeating nystagmus with smooth pursuits. Performed Gilberto Better to left with one beat of nystagmus and pt reporting increased dizziness, treated with Epley manuever. Pt reporting no change following manuever Other Exercises: VORx1 (5 reps)    General Comments        Pertinent Vitals/Pain Pain Assessment Pain Assessment: Faces Faces Pain  Scale: Hurts little more Pain Location: headache Pain Descriptors / Indicators: Throbbing, Aching Pain Intervention(s): Limited activity within patient's tolerance, Monitored during session, Patient requesting pain meds-RN notified    Home Living                          Prior Function            PT Goals  (current goals can now be found in the care plan section) Acute Rehab PT Goals Patient Stated Goal: have less dizziness PT Goal Formulation: With patient Time For Goal Achievement: 08/15/22 Potential to Achieve Goals: Good Progress towards PT goals: Progressing toward goals    Frequency    Min 1X/week      PT Plan Current plan remains appropriate    Co-evaluation              AM-PAC PT "6 Clicks" Mobility   Outcome Measure  Help needed turning from your back to your side while in a flat bed without using bedrails?: None Help needed moving from lying on your back to sitting on the side of a flat bed without using bedrails?: None Help needed moving to and from a bed to a chair (including a wheelchair)?: None Help needed standing up from a chair using your arms (e.g., wheelchair or bedside chair)?: None Help needed to walk in hospital room?: None Help needed climbing 3-5 steps with a railing? : A Little 6 Click Score: 23    End of Session   Activity Tolerance: Patient limited by pain (headache and dizziness) Patient left: in bed;with call bell/phone within reach Nurse Communication: Mobility status PT Visit Diagnosis: BPPV     Time: 1610-9604 PT Time Calculation (min) (ACUTE ONLY): 23 min  Charges:  $Therapeutic Activity: 8-22 mins $Canalith Rep Proc: 8-22 mins                     Lillia Pauls, PT, DPT Acute Rehabilitation Services Office [phone]    Dillon Macdonald 08/02/2022, 12:54 PM

## 2022-08-02 NOTE — Progress Notes (Signed)
PROGRESS NOTE    Dillon Macdonald  ZOX:[id number] DOB: Sep 23, 1970 DOA: 07/29/2022 PCP: Pcp, No  Outpatient Specialists:     Brief Narrative:  Patient is a 52 year old male with history of recent neck surgery on NSAIDs.  Patient was admitted with acute GI bleed.  EGD revealed Dieulafoy lesion that has been injected and cauterized.  No further hematemesis reported.  Hemoglobin is 7.9 g/dL today, down from 9 g/dL yesterday.  Patient is lethargic.  I believe patient just received fentanyl.  Urinary obstruction is also reported.  08/01/2022: Patient continues to report headache.  Headache seems migraine-like.  Will discontinue fentanyl.  Will start patient on subcutaneous sumatriptan every 2 hourly as needed x 2 doses for migraine headache.  Will discontinue Protonix drip.  Continue oral Protonix.  Discontinue Foley catheter.  Bladder scan-outpatient voids.  Discontinue IV fluids.  No further bleeding reported.  Hopefully, patient be discharged back home in the next 1 to 2 days.  08/02/2022: Patient seen.  Migraine-like headache persists.  Have a low threshold to proceed with MRI head if there is no contraindication.  No further GI bleeding.  Patient is tolerating orally.  Likely discharge in 24 to 48 hours.   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 -EGD revealed Dieulafoy lesion.   -S/p cauterization of the lesion. -Hemoglobin is 8.1 g/dL today.  -Continue to monitor H/H. -No further hematemesis reported. -Continue PPI. -GI input is appreciated. -Counseled to quit NSAIDs.    Hx Cervical Radiculopathy: - s/p C7 and T1 laminotomies and foraminotomies with microdiscectomy (Dr. Jordan Likes 07/27/22) - F/u with neurosurgery as outpatient. - Hold home flexeril and gabapentin. 07/31/2022: Optimize pain  control.   Hx of anxiety.  Migraine like headache: Subcutaneous sumatriptan.  DVT prophylaxis:  Code Status: Full code. Family Communication:  Disposition Plan: Home eventually   Consultants:  GI.  Procedures:  EGD. S/p intubation and extubation.  Antimicrobials:  None.   Subjective: -No further GI bleed. -Patient continues to report migraine-like headache.   Objective: Vitals:   07/31/22 2320 08/01/22 0549 08/01/22 0843 08/02/22 1810  BP: 116/72  101/71 119/76  Pulse: 80  77 77  Resp: Temp: 98.4 F (36.9 C)  98 F (36.7 C) 97.9 F (36.6 C)  TempSrc: Oral  Oral Oral  SpO2: 94%  97% 96%  Weight:  77.9 kg     No intake or output data in the 24 hours ending 08/02/22 1840  Filed Weights   07/30/22 0422 07/31/22 0648 08/01/22 0549  Weight: 80.6 kg 84.2 kg 77.9 kg    Examination:  General exam: Appears lethargic (patient just received fentanyl). Respiratory system: Clear to auscultation.  Cardiovascular system: S1 & S2 heard Gastrointestinal system: Abdomen is obese, soft and nontender. Central nervous system: Lethargic. Extremities: No leg edema.  Data Reviewed: I have personally reviewed following labs and imaging studies  CBC: Recent Labs  Lab 07/29/22 1733 07/29/22 1737 07/30/22 0752 07/30/22 1411 07/30/22 1757 07/31/22 1123 08/01/22 0128 08/02/22 0114  WBC 9.9  --  5.7  --   --   --  4.3 4.0  NEUTROABS 7.3  --   --   --   --   --  3.0 2.7  HGB 8.8*   < >  8.6* 9.1* 9.0* 7.9* 8.1* 8.0*  HCT 28.0*   < > 25.6* 26.3* 25.8*  --  23.5* 23.7*  MCV 94.9  --  88.3  --   --   --  88.0 89.4  PLT 135*  --  103*  --   --   --  98* 113*   < > = values in this interval not displayed.    Basic Metabolic Panel: Recent Labs  Lab 07/29/22 1733 07/29/22 1737 07/29/22 2335 07/30/22 0752 07/31/22 0131  NA 137 138 136 140 136  K 4.2 4.3 6.0* 4.0 3.9  CL 106 103  --  107 100  CO2 24  --   --  26 27  GLUCOSE 169* 167*  --  104* 95  BUN 44*  42*  --  26* 14  CREATININE 1.03 1.00  --  0.99 0.89  CALCIUM 7.9*  --   --  7.9* 7.9*  MG  --   --   --  1.7 1.9  PHOS  --   --   --  2.9  --     GFR: Estimated Creatinine Clearance: 100.4 mL/min (by C-G formula based on SCr of 0.89 mg/dL). Liver Function Tests: Recent Labs  Lab 07/29/22 1733  AST 20  ALT 14  ALKPHOS 34*  BILITOT 0.4  PROT 4.6*  ALBUMIN 2.9*    No results for input(s): "LIPASE", "AMYLASE" in the last 168 hours. No results for input(s): "AMMONIA" in the last 168 hours. Coagulation Profile: Recent Labs  Lab 07/29/22 1733  INR 1.2    Cardiac Enzymes: No results for input(s): "CKTOTAL", "CKMB", "CKMBINDEX", "TROPONINI" in the last 168 hours. BNP (last 3 results) No results for input(s): "PROBNP" in the last 8760 hours. HbA1C: No results for input(s): "HGBA1C" in the last 72 hours. CBG: Recent Labs  Lab 07/29/22 2337 07/30/22 0305 07/30/22 0715 07/30/22 1129 07/30/22 1521  GLUCAP 127* 105* 98 81 98    Lipid Profile: No results for input(s): "CHOL", "HDL", "LDLCALC", "TRIG", "CHOLHDL", "LDLDIRECT" in the last 72 hours.  Thyroid Function Tests: No results for input(s): "TSH", "T4TOTAL", "FREET4", "T3FREE", "THYROIDAB" in the last 72 hours. Anemia Panel: No results for input(s): "VITAMINB12", "FOLATE", "FERRITIN", "TIBC", "IRON", "RETICCTPCT" in the last 72 hours. Urine analysis:    Component Value Date/Time   COLORURINE YELLOW 10/18/2017 1644   APPEARANCEUR CLEAR 10/18/2017 1644   LABSPEC 1.025 10/18/2017 1644   PHURINE 5.0 10/18/2017 1644   GLUCOSEU NEGATIVE 10/18/2017 1644   HGBUR NEGATIVE 10/18/2017 1644   BILIRUBINUR NEGATIVE 10/18/2017 1644   KETONESUR NEGATIVE 10/18/2017 1644   PROTEINUR NEGATIVE 10/18/2017 1644   NITRITE NEGATIVE 10/18/2017 1644   LEUKOCYTESUR NEGATIVE 10/18/2017 1644   Sepsis Labs: @LABRCNTIP (procalcitonin:4,lacticidven:4)  ) Recent Results (from the past 240 hour(s))  MRSA Next Gen by PCR, Nasal     Status:  None   Collection Time: 07/29/22  7:55 PM   Specimen: Nasal Mucosa; Nasal Swab  Result Value Ref Range Status   MRSA by PCR Next Gen NOT DETECTED NOT DETECTED Final    Comment: (NOTE) The GeneXpert MRSA Assay (FDA approved for NASAL specimens only), is one component of a comprehensive MRSA colonization surveillance program. It is not intended to diagnose MRSA infection nor to guide or monitor treatment for MRSA infections. Test performance is not FDA approved in patients less than 22 years old. Performed at [facility], [address] [place], [place] [address]  Radiology Studies: No results found.      Scheduled Meds:  Chlorhexidine Gluconate Cloth  6 each Topical Q0600   mouth rinse  15 mL Mouth Rinse BID PC   pantoprazole  40 mg Oral BID   tamsulosin  0.4 mg Oral Daily   Continuous Infusions:  sodium chloride Stopped (07/29/22 2145)     LOS: 4 days    Time spent: 35 minutes.    Berton Mount, MD  [facility] Pager #: [phone] 7PM-7AM contact night coverage as above

## 2022-08-03 ENCOUNTER — Inpatient Hospital Stay (HOSPITAL_COMMUNITY): Payer: Medicaid Other

## 2022-08-03 DIAGNOSIS — D500 Iron deficiency anemia secondary to blood loss (chronic): Secondary | ICD-10-CM | POA: Diagnosis not present

## 2022-08-03 DIAGNOSIS — K922 Gastrointestinal hemorrhage, unspecified: Secondary | ICD-10-CM | POA: Diagnosis not present

## 2022-08-03 DIAGNOSIS — R579 Shock, unspecified: Secondary | ICD-10-CM | POA: Diagnosis not present

## 2022-08-03 LAB — CBC WITH DIFFERENTIAL/PLATELET
Abs Immature Granulocytes: 0.01 10*3/uL (ref 0.00–0.07)
Basophils Absolute: 0 10*3/uL (ref 0.0–0.1)
Basophils Relative: 0 %
Eosinophils Absolute: 0.2 10*3/uL (ref 0.0–0.5)
Eosinophils Relative: 4 %
HCT: 26.8 % — ABNORMAL LOW (ref 39.0–52.0)
Hemoglobin: 8.9 g/dL — ABNORMAL LOW (ref 13.0–17.0)
Immature Granulocytes: 0 %
Lymphocytes Relative: 30 %
Lymphs Abs: 1 10*3/uL (ref 0.7–4.0)
MCH: 29.6 pg (ref 26.0–34.0)
MCHC: 33.2 g/dL (ref 30.0–36.0)
MCV: 89 fL (ref 80.0–100.0)
Monocytes Absolute: 0.3 10*3/uL (ref 0.1–1.0)
Monocytes Relative: 9 %
Neutro Abs: 2 10*3/uL (ref 1.7–7.7)
Neutrophils Relative %: 57 %
Platelets: 130 10*3/uL — ABNORMAL LOW (ref 150–400)
RBC: 3.01 MIL/uL — ABNORMAL LOW (ref 4.22–5.81)
RDW: 14 % (ref 11.5–15.5)
WBC: 3.5 10*3/uL — ABNORMAL LOW (ref 4.0–10.5)
nRBC: 0 % (ref 0.0–0.2)

## 2022-08-03 NOTE — Evaluation (Addendum)
Occupational Therapy Evaluation Patient Details Name: Dillon Macdonald MRN: [id number] DOB: December 09, 1970 Today's Date: 08/03/2022   History of Present Illness 52 y.o. male arrives in ED 4/18  via EMS after large volume hematemesis and melena and syncopal event, Found to be hypotensive in ED and EGD performed revealing acute GI bleed and endoscopic treatment of dieulafoy lesion, pt requiring short term intubation after EGD PMHx: s/p 4/15 microdiscectomy C7-T1  arthritis, herpes simplex, tobacco use, lumbar and cervical fusions   Clinical Impression   Pt reports independence at baseline with ADLs and functional mobility, lives alone and was very active prior to admission. Pt reports feeling very fatigued and noted decreased cognition. Pt needing min guard-min A for ADLs, mod I for bed mobility, and min guard for transfers without AD. Pt with LUE weakness (reports x1 month prior to neck surgery) with numbness in digits 3-5, impaired fine motor coordination. Pt reports experiencing visual symptoms, blurred vision and diplopia but intermittently (see vision section), able to read short paragraph and functional for BADL, recommended pt follow up with eye doctor and pt verbalized understanding. Pt presenting with impairments listed below, will follow acutely. Recommend OP OT to address LUE and vision deficits at d/c.      Recommendations for follow up therapy are one component of a multi-disciplinary discharge planning process, led by the attending physician.  Recommendations may be updated based on patient status, additional functional criteria and insurance authorization.   Assistance Recommended at Discharge Set up Supervision/Assistance  Patient can return home with the following Assist for transportation;Direct supervision/assist for medications management;Direct supervision/assist for financial management;Assistance with cooking/housework    Functional Status Assessment  Patient has had a recent decline  in their functional status and demonstrates the ability to make significant improvements in function in a reasonable and predictable amount of time.  Equipment Recommendations  None recommended by OT    Recommendations for Other Services       Precautions / Restrictions Precautions Precautions: Fall;Cervical;Back Precaution Comments: verbally reviewed cervical precautions Required Braces or Orthoses: Other Brace Other Brace: per pt he did not have a cervical collar/brace from neck surgery Restrictions Weight Bearing Restrictions: No      Mobility Bed Mobility Overal bed mobility: Modified Independent             General bed mobility comments: log rolls adhering to precautions    Transfers Overall transfer level: Needs assistance Equipment used: None Transfers: Sit to/from Stand Sit to Stand: Min guard                  Balance Overall balance assessment: Needs assistance Sitting-balance support: Feet supported Sitting balance-Leahy Scale: Normal     Standing balance support: Bilateral upper extremity supported, No upper extremity supported, During functional activity Standing balance-Leahy Scale: Fair Standing balance comment: mild unsteadiness with short distance ambulation in room                           ADL either performed or assessed with clinical judgement   ADL Overall ADL's : Needs assistance/impaired Eating/Feeding: Sitting;Minimal assistance Eating/Feeding Details (indicate cue type and reason): for opening containers Grooming: Standing;Minimal assistance Grooming Details (indicate cue type and reason): for opening containers Upper Body Bathing: Min guard   Lower Body Bathing: Min guard   Upper Body Dressing : Min guard   Lower Body Dressing: Min guard   Toilet Transfer: Min guard;Ambulation;Comfort height toilet Toilet Transfer Details (indicate cue type  and reason): simulated via functional mobility Toileting- Clothing  Manipulation and Hygiene: Min guard       Functional mobility during ADLs: Min guard       Vision Baseline Vision/History: 1 Wears glasses Ability to See in Adequate Light: 0 Adequate Patient Visual Report: Diplopia;Blurring of vision (intermittently) Vision Assessment?: Yes Eye Alignment: Within Functional Limits Ocular Range of Motion: Within Functional Limits Alignment/Gaze Preference: Within Defined Limits Tracking/Visual Pursuits: Able to track stimulus in all quads without difficulty Saccades: Within functional limits;Additional head turns occurred during testing Convergence: Within functional limits Visual Fields: No apparent deficits Diplopia Assessment: Objects split on top of one another;Other (comment) (present intermittently per pt, reports stacked on top of eachother and "to the right") Depth Perception:  (did not note any under/overshooting with finger to nose test bilaterally, slowed on the L, due to LUE weakness. however pt reports he has when double vision is occuring) Additional Comments: pt reporting double vision in L eye with R eye occluded, states items are "stacked on top of eachother and to the right", only reports blurred vision in R eye with L eye occluded. Wears glasses for reading and is able accurately read short paragraph with glasses donned. Attempted taping 1/3 and 1/6 nasal portion of pt's L (non-dominant eye) pt reports no change, is able to see better without glasses taped. Attempted also taping 1/3 top and then 1/3 bottom of glasses, pt reports no change, reports he can see better without glasses. Encouraged pt to keep track of "episodes " of double vision to see if there is any correlation.     Perception Perception Perception Tested?: No   Praxis Praxis Praxis tested?: Not tested    Pertinent Vitals/Pain Pain Assessment Pain Assessment: Faces Pain Score: 4  Faces Pain Scale: Hurts little more Pain Location: headache Pain Descriptors /  Indicators: Throbbing, Aching Pain Intervention(s): Limited activity within patient's tolerance, Monitored during session, Repositioned     Hand Dominance Right   Extremity/Trunk Assessment Upper Extremity Assessment Upper Extremity Assessment: LUE deficits/detail LUE Deficits / Details: 2+/5 shoulder ROM (limited to 90* due to cervical precautions), elbow/wrist/hand weakness, pt reports numbness tingling in digits 3-5 on L hand x 1 month, consistent with ulnar nerve distribution, able to form full fist but difficutly with grasping objects (opening jar) LUE Sensation: decreased light touch;decreased proprioception LUE Coordination: decreased fine motor;decreased gross motor   Lower Extremity Assessment Lower Extremity Assessment: Defer to PT evaluation   Cervical / Trunk Assessment Cervical / Trunk Assessment: Back Surgery;Neck Surgery   Communication Communication Communication: No difficulties   Cognition Arousal/Alertness: Awake/alert Behavior During Therapy: Flat affect Overall Cognitive Status: Impaired/Different from baseline Area of Impairment: Following commands, Memory                     Memory: Decreased short-term memory Following Commands: Follows one step commands with increased time       General Comments: pt with slowed processing, difficulty describing symptoms when asked by therapist. States he feels "tired' and thinking is slower than normal.     General Comments  VSS on RA    Exercises Other Exercises Other Exercises: LUE squeeze ball x5   Shoulder Instructions      Home Living Family/patient expects to be discharged to:: Private residence Living Arrangements: Alone Available Help at Discharge: Family;Neighbor;Available PRN/intermittently Type of Home: House Home Access: Stairs to enter Entrance Stairs-Number of Steps: 3   Home Layout: One level     Bathroom Shower/Tub:  Tub/shower unit   Bathroom Toilet: Standard Bathroom  Accessibility: Yes   Home Equipment: Agricultural consultant (2 wheels)          Prior Functioning/Environment Prior Level of Function : Independent/Modified Independent;Driving;Working/employed             Mobility Comments: indep ADLs Comments: works, indep. reports very active, took care of his parents and went to the gym        OT Problem List: Decreased strength;Decreased range of motion;Decreased activity tolerance;Decreased knowledge of precautions;Impaired vision/perception;Decreased cognition;Impaired UE functional use;Impaired sensation      OT Treatment/Interventions: Self-care/ADL training;Therapeutic exercise;DME and/or AE instruction;Therapeutic activities;Patient/family education;Balance training;Energy conservation;Cognitive remediation/compensation;Visual/perceptual remediation/compensation    OT Goals(Current goals can be found in the care plan section) Acute Rehab OT Goals Patient Stated Goal: to feel better OT Goal Formulation: With patient Time For Goal Achievement: 08/17/22 Potential to Achieve Goals: Good ADL Goals Pt Will Perform Upper Body Dressing: Independently;sitting Pt Will Perform Lower Body Dressing: Independently;sit to/from stand;sitting/lateral leans Pt Will Transfer to Toilet: Independently;ambulating;regular height toilet Pt Will Perform Tub/Shower Transfer: Tub transfer;Shower transfer;Independently;ambulating Pt/caregiver will Perform Home Exercise Program: Increased strength;Increased ROM;Left upper extremity;With written HEP provided;Independently Additional ADL Goal #1: pt will identify 3 visual compensatory strategies in prep for ADLs  OT Frequency: Min 1X/week    Co-evaluation              AM-PAC OT "6 Clicks" Daily Activity     Outcome Measure Help from another person eating meals?: A Little Help from another person taking care of personal grooming?: A Little Help from another person toileting, which includes using toliet, bedpan,  or urinal?: A Little Help from another person bathing (including washing, rinsing, drying)?: A Little Help from another person to put on and taking off regular upper body clothing?: A Little Help from another person to put on and taking off regular lower body clothing?: A Little 6 Click Score: 18   End of Session Nurse Communication: Mobility status  Activity Tolerance: Patient tolerated treatment well Patient left: in bed;with call bell/phone within reach;with family/visitor present  OT Visit Diagnosis: Unsteadiness on feet (R26.81);Other abnormalities of gait and mobility (R26.89);Low vision, both eyes (H54.2);Muscle weakness (generalized) (M62.81)                Time: 1610-9604 OT Time Calculation (min): 39 min Charges:  OT General Charges $OT Visit: 1 Visit OT Evaluation $OT Eval Moderate Complexity: 1 Mod OT Treatments $Therapeutic Activity: 23-37 mins  Carver Fila, OTD, OTR/L SecureChat Preferred Acute Rehab [phone]  Dalphine Handing 08/03/2022, 4:47 PM

## 2022-08-03 NOTE — Progress Notes (Signed)
Physical Therapy Treatment Patient Details Name: Dillon Macdonald MRN: [id number] DOB: 1970-10-29 Today's Date: 08/03/2022   History of Present Illness 52 y.o. male arrives in ED 4/18  via EMS after large volume hematemesis and melena and syncopal event, Found to be hypotensive in ED and EGD performed revealing acute GI bleed and endoscopic treatment of dieulafoy lesion, pt requiring short term intubation after EGD PMHx: s/p 4/15 microdiscectomy C7-T1  arthritis, herpes simplex, tobacco use, lumbar and cervical fusions    PT Comments    Pt progressing towards all goals. Pt denies feeling of "spinning" but reports "lightheadedness." Pt with no nystagmus or report of "dizziness" with dix hallpike both L/R. Pt did report "off sensation" when returning to sit. Pt with difficulty tracking finger and often over shoots target in which eyes have to come back to correct. In doing VORx1 exercises pt asked "Am I suppose to be seeing two As?" Pt found to have double vision out of R eye with L eye occluded. Pt reports blurred vision out of L eye when R eye occluded. OT order placed to further access vision. Acute PT to continue to follow.    Recommendations for follow up therapy are one component of a multi-disciplinary discharge planning process, led by the attending physician.  Recommendations may be updated based on patient status, additional functional criteria and insurance authorization.  Follow Up Recommendations       Assistance Recommended at Discharge Intermittent Supervision/Assistance  Patient can return home with the following Assist for transportation;Assistance with cooking/housework   Equipment Recommendations  None recommended by PT    Recommendations for Other Services       Precautions / Restrictions Precautions Precautions: Fall;Cervical;Back Precaution Booklet Issued: Yes (comment) Precaution Comments: verbally reviewed cervical precautions Required Braces or Orthoses: Other  Brace Other Brace: per pt he did not have a cervical collar/brace from neck surgery Restrictions Weight Bearing Restrictions: No     Mobility  Bed Mobility Overal bed mobility: Modified Independent Bed Mobility: Rolling, Sidelying to Sit Rolling: Modified independent (Device/Increase time) Sidelying to sit: Modified independent (Device/Increase time)       General bed mobility comments: log rolls adhering to precautions    Transfers Overall transfer level: Needs assistance Equipment used: None Transfers: Sit to/from Stand Sit to Stand: Min guard           General transfer comment: slight unsteadiness without DME upon initial standing, pt with report of "light headedness" but resolves quickly    Ambulation/Gait Ambulation/Gait assistance: Min guard Gait Distance (Feet): 200 Feet Assistive device: None Gait Pattern/deviations: Step-through pattern, Decreased stride length Gait velocity: slowed Gait velocity interpretation: <1.31 ft/sec, indicative of household ambulator   General Gait Details: Mild unsteadiness, pt with L sided limp/antalgia with L knee weakness but no overt LOB   Stairs             Wheelchair Mobility    Modified Rankin (Stroke Patients Only)       Balance Overall balance assessment: Needs assistance Sitting-balance support: Feet supported Sitting balance-Leahy Scale: Normal     Standing balance support: Bilateral upper extremity supported, No upper extremity supported, During functional activity Standing balance-Leahy Scale: Fair Standing balance comment: mild unsteadiness with short distance ambulation in room                            Cognition Arousal/Alertness: Awake/alert Behavior During Therapy: Flat affect Overall Cognitive Status: Impaired/Different from baseline Area of  Impairment: Following commands, Memory                     Memory: Decreased short-term memory Following Commands: Follows one  step commands with increased time       General Comments: pt with slowed processing, difficulty describing symptoms when asked by therapist. States he feels "tired' and thinking is slower than normal.        Exercises Other Exercises Other Exercises: VORx1, 2 trials for 30 sec    General Comments General comments (skin integrity, edema, etc.): VSS on RA      Pertinent Vitals/Pain Pain Assessment Pain Assessment: 0-10 Pain Score: 3  Pain Location: headache Pain Descriptors / Indicators: Throbbing, Aching Pain Intervention(s): Monitored during session    Home Living                          Prior Function            PT Goals (current goals can now be found in the care plan section) Acute Rehab PT Goals Patient Stated Goal: feel better, more like himself PT Goal Formulation: With patient Time For Goal Achievement: 08/15/22 Potential to Achieve Goals: Good Progress towards PT goals: Progressing toward goals    Frequency    Min 1X/week      PT Plan Current plan remains appropriate    Co-evaluation              AM-PAC PT "6 Clicks" Mobility   Outcome Measure  Help needed turning from your back to your side while in a flat bed without using bedrails?: None Help needed moving from lying on your back to sitting on the side of a flat bed without using bedrails?: None Help needed moving to and from a bed to a chair (including a wheelchair)?: None Help needed standing up from a chair using your arms (e.g., wheelchair or bedside chair)?: None Help needed to walk in hospital room?: A Little Help needed climbing 3-5 steps with a railing? : A Little 6 Click Score: 22    End of Session Equipment Utilized During Treatment: Gait belt Activity Tolerance: Patient limited by fatigue Patient left: in bed;with call bell/phone within reach (sitting on EOB to eat lunch) Nurse Communication: Mobility status PT Visit Diagnosis: BPPV;Muscle weakness (generalized)  (M62.81);Difficulty in walking, not elsewhere classified (R26.2)     Time: 1343-1430 PT Time Calculation (min) (ACUTE ONLY): 47 min  Charges:  $Gait Training: 8-22 mins $Therapeutic Exercise: 8-22 mins $Canalith Rep Proc: 8-22 mins                     Lewis Shock, PT, DPT Acute Rehabilitation Services Secure chat preferred Office #: [phone]    Iona Hansen 08/03/2022, 8:13 PM

## 2022-08-03 NOTE — Progress Notes (Signed)
[facility] PROGRESS NOTE    Progress Note  Dillon Macdonald  JXB:[id number] DOB: [birth year] DOA: 07/29/2022 PCP: Pcp, No     Brief Narrative:   Dillon Macdonald is an 52 y.o. male past medical history of neck surgery on NSAIDs was admitted for acute GI bleed, EGD revealed Dieulafoy lesion.  Now on oral Protonix IV fluids with been discontinued.     Assessment/Plan:   Acute Upper GI bleed Status post EGD cauterization of lesions. On oral Protonix no further hematemesis. Hemoglobin this morning is 8.9 (up from 8.0 the day prior).  History of cervical radiculopathy: Follow-up with neurosurgery as an outpatient. Continue home Flexeril and gabapentin.  History of anxiety: Noted.  Migraine headaches: Continue sumatriptan.  Pain has not significantly improved. Will get a CT scan of the head.    DVT prophylaxis: scd Family Communication:none Status is: Inpatient Remains inpatient appropriate because: Acute GI bleed.    Code Status:     Code Status Orders  (From admission, onward)           Start     Ordered   07/29/22 1848  Full code  Continuous       Question:  By:  Answer:  Consent: discussion documented in EHR   07/29/22 1847           Code Status History     Date Active Date Inactive Code Status Order ID Comments User Context   07/27/2022 1224 07/28/2022 1809 Full Code [id number]  Julio Sicks, MD Inpatient   11/06/2013 1807 11/09/2013 1621 Full Code [id number]  Temple Pacini, MD Inpatient         IV Access:   Peripheral IV   Procedures and diagnostic studies:   No results found.   Medical Consultants:   None.   Subjective:    [name] still complaining of headaches and lightheadedness upon standing.  Objective:    Vitals:   08/02/22 1810 08/02/22 1948 08/02/22 2335 08/03/22 0451  BP: 119/76 110/72 105/70 120/78  Pulse: 77 84 78 76  Resp: Temp: 97.9 F (36.6 C) 98.2 F (36.8 C) 98.2 F (36.8 C) 98.6 F  (37 C)  TempSrc: Oral Oral Oral Oral  SpO2: 96% 97% 97% 95%  Weight:    76 kg   SpO2: 95 % O2 Flow Rate (L/min): 2 L/min FiO2 (%): 40 %   Intake/Output Summary (Last 24 hours) at 08/03/2022 0740 Last data filed at 08/03/2022 0200 Gross per 24 hour  Intake 600 ml  Output --  Net 600 ml   Filed Weights   07/31/22 0648 08/01/22 0549 08/03/22 0451  Weight: 84.2 kg 77.9 kg 76 kg    Exam: General exam: In no acute distress. Respiratory system: Good air movement and clear to auscultation. Cardiovascular system: S1 & S2 heard, RRR. No JVD. Gastrointestinal system: Abdomen is nondistended, soft and nontender.  Extremities: No pedal edema. Skin: No rashes, lesions or ulcers Psychiatry: Judgement and insight appear normal. Mood & affect appropriate.    Data Reviewed:    Labs: Basic Metabolic Panel: Recent Labs  Lab 07/29/22 1733 07/29/22 1737 07/29/22 2335 07/30/22 0752 07/31/22 0131  NA 137 138 136 140 136  K 4.2 4.3 6.0* 4.0 3.9  CL 106 103  --  107 100  CO2 24  --   --  26 27  GLUCOSE 169* 167*  --  104* 95  BUN 44* 42*  --  26* 14  CREATININE 1.03 1.00  --  0.99 0.89  CALCIUM 7.9*  --   --  7.9* 7.9*  MG  --   --   --  1.7 1.9  PHOS  --   --   --  2.9  --    GFR Estimated Creatinine Clearance: 100.4 mL/min (by C-G formula based on SCr of 0.89 mg/dL). Liver Function Tests: Recent Labs  Lab 07/29/22 1733  AST 20  ALT 14  ALKPHOS 34*  BILITOT 0.4  PROT 4.6*  ALBUMIN 2.9*   No results for input(s): "LIPASE", "AMYLASE" in the last 168 hours. No results for input(s): "AMMONIA" in the last 168 hours. Coagulation profile Recent Labs  Lab 07/29/22 1733  INR 1.2   COVID-19 Labs  No results for input(s): "DDIMER", "FERRITIN", "LDH", "CRP" in the last 72 hours.  No results found for: "SARSCOV2NAA"  CBC: Recent Labs  Lab 07/29/22 1733 07/29/22 1737 07/30/22 0752 07/30/22 1411 07/30/22 1757 07/31/22 1123 08/01/22 0128 08/02/22 0114 08/03/22 0121   WBC 9.9  --  5.7  --   --   --  4.3 4.0 3.5*  NEUTROABS 7.3  --   --   --   --   --  3.0 2.7 2.0  HGB 8.8*   < > 8.6* 9.1* 9.0* 7.9* 8.1* 8.0* 8.9*  HCT 28.0*   < > 25.6* 26.3* 25.8*  --  23.5* 23.7* 26.8*  MCV 94.9  --  88.3  --   --   --  88.0 89.4 89.0  PLT 135*  --  103*  --   --   --  98* 113* 130*   < > = values in this interval not displayed.   Cardiac Enzymes: No results for input(s): "CKTOTAL", "CKMB", "CKMBINDEX", "TROPONINI" in the last 168 hours. BNP (last 3 results) No results for input(s): "PROBNP" in the last 8760 hours. CBG: Recent Labs  Lab 07/29/22 2337 07/30/22 0305 07/30/22 0715 07/30/22 1129 07/30/22 1521  GLUCAP 127* 105* 98 81 98   D-Dimer: No results for input(s): "DDIMER" in the last 72 hours. Hgb A1c: No results for input(s): "HGBA1C" in the last 72 hours. Lipid Profile: No results for input(s): "CHOL", "HDL", "LDLCALC", "TRIG", "CHOLHDL", "LDLDIRECT" in the last 72 hours. Thyroid function studies: No results for input(s): "TSH", "T4TOTAL", "T3FREE", "THYROIDAB" in the last 72 hours.  Invalid input(s): "FREET3" Anemia work up: No results for input(s): "VITAMINB12", "FOLATE", "FERRITIN", "TIBC", "IRON", "RETICCTPCT" in the last 72 hours. Sepsis Labs: Recent Labs  Lab 07/30/22 0752 08/01/22 0128 08/02/22 0114 08/03/22 0121  WBC 5.7 4.3 4.0 3.5*   Microbiology Recent Results (from the past 240 hour(s))  MRSA Next Gen by PCR, Nasal     Status: None   Collection Time: 07/29/22  7:55 PM   Specimen: Nasal Mucosa; Nasal Swab  Result Value Ref Range Status   MRSA by PCR Next Gen NOT DETECTED NOT DETECTED Final    Comment: (NOTE) The GeneXpert MRSA Assay (FDA approved for NASAL specimens only), is one component of a comprehensive MRSA colonization surveillance program. It is not intended to diagnose MRSA infection nor to guide or monitor treatment for MRSA infections. Test performance is not FDA approved in patients less than 13  years old. Performed at [facility], [address], [place], [place] [address]      Medications:    Chlorhexidine Gluconate Cloth  6 each Topical Q0600   mouth rinse  15 mL Mouth Rinse BID PC  pantoprazole  40 mg Oral BID   tamsulosin  0.4 mg Oral Daily   Continuous Infusions:  sodium chloride Stopped (07/29/22 2145)      LOS: 5 days   Marinda Elk  [facility]  08/03/2022, 7:40 AM

## 2022-08-04 ENCOUNTER — Encounter (HOSPITAL_COMMUNITY): Payer: Self-pay | Admitting: Internal Medicine

## 2022-08-04 ENCOUNTER — Other Ambulatory Visit (HOSPITAL_COMMUNITY): Payer: Self-pay

## 2022-08-04 DIAGNOSIS — K922 Gastrointestinal hemorrhage, unspecified: Secondary | ICD-10-CM | POA: Diagnosis not present

## 2022-08-04 DIAGNOSIS — R579 Shock, unspecified: Secondary | ICD-10-CM | POA: Diagnosis not present

## 2022-08-04 DIAGNOSIS — D500 Iron deficiency anemia secondary to blood loss (chronic): Secondary | ICD-10-CM | POA: Diagnosis not present

## 2022-08-04 MED ORDER — TAMSULOSIN HCL 0.4 MG PO CAPS
0.4000 mg | ORAL_CAPSULE | Freq: Every day | ORAL | 2 refills | Status: AC
  Filled 2022-08-04 – 2022-08-29 (×2): qty 30, 30d supply, fill #0
  Filled 2022-09-28: qty 30, 30d supply, fill #1

## 2022-08-04 MED ORDER — FERROUS SULFATE 325 (65 FE) MG PO TABS
325.0000 mg | ORAL_TABLET | Freq: Every day | ORAL | 1 refills | Status: AC
  Filled 2022-08-04 – 2022-09-28 (×3): qty 60, 60d supply, fill #0

## 2022-08-04 MED ORDER — PANTOPRAZOLE SODIUM 40 MG PO TBEC
40.0000 mg | DELAYED_RELEASE_TABLET | Freq: Two times a day (BID) | ORAL | 3 refills | Status: AC
  Filled 2022-08-04 – 2022-08-29 (×2): qty 60, 30d supply, fill #0
  Filled 2022-09-28: qty 60, 30d supply, fill #1
  Filled 2022-11-04: qty 60, 30d supply, fill #2

## 2022-08-04 NOTE — TOC Transition Note (Signed)
Transition of Care (TOC) - CM/SW Discharge Note Donn Pierini RN, BSN Transitions of Care Unit 4E- RN Case Manager See Treatment Team for direct phone #   Patient Details  Name: Dillon Macdonald MRN: [id number] Date of Birth: [birth year]  Transition of Care [facility]) CM/SW Contact:  Darrold Span, RN Phone Number: 08/04/2022, 10:08 AM   Clinical Narrative:    Pt stable for transition home today, noted recommendations for both [facility] and outpt per PT/OT.  CM spoke with pt at bedside regarding therapy recommendations and discussed both HH and outpt- pt voiced he would prefer to do outpt as he will not be homebound. Discussed with pt location options for outpt w/ vestibular rehab pt states he is agreeable to drive to [place] if needed.   Also discussed with pt need for PCP and GI follow up. Pt voiced that he does not have a PCP and needs to find one. CM provided pt list for both PCP and GI options from pt's insurance provider website for in-network providers- pt voiced he will f/u to make appointments.   Referral made to [facility] for outpt PT/OT (vestibular) rehab  Pt states he has transportation home, No further TOC needs noted.    Final next level of care: OP Rehab Barriers to Discharge: No Barriers Identified   Patient Goals and CMS Choice   Choice offered to / list presented to : NA  Discharge Placement                 Home        Discharge Plan and Services Additional resources added to the After Visit Summary for     Discharge Planning Services: CM Consult Post Acute Care Choice: NA          DME Arranged: N/A DME Agency: NA       HH Arranged: NA HH Agency: NA        Social Determinants of Health (SDOH) Interventions SDOH Screenings   Tobacco Use: Medium Risk (08/04/2022)     Readmission Risk Interventions    08/04/2022   10:08 AM  Readmission Risk Prevention Plan  Post Dischage Appt Not Complete  Appt Comments pt to schedule f/u   Medication Screening Complete  Transportation Screening Complete

## 2022-08-04 NOTE — Progress Notes (Signed)
Physical Therapy Treatment Patient Details Name: Dillon Macdonald MRN: [id number] DOB: Dec 07, 1970 Today's Date: 08/04/2022   History of Present Illness 52 y.o. male arrives in ED 4/18  via EMS after large volume hematemesis and melena and syncopal event, Found to be hypotensive in ED and EGD performed revealing acute GI bleed and endoscopic treatment of dieulafoy lesion, pt requiring short term intubation after EGD PMHx: s/p 4/15 microdiscectomy C7-T1  arthritis, herpes simplex, tobacco use, lumbar and cervical fusions    PT Comments    Pt admitted with above diagnosis. Pt progressing well and dizziness resolved today.  Pt does report that his right foot plantar fascitis is bothering him a lot and this PT gave pt stretches and recommendations for a brace to wear at night.  Pt appreciative.  F/u at Outpt PT and OT arranged per CM. Most likely will d/c today.   Pt currently with functional limitations due to balance and endurance deficits. Pt will benefit from acute skilled PT to increase their independence and safety with mobility to allow discharge.      Recommendations for follow up therapy are one component of a multi-disciplinary discharge planning process, led by the attending physician.  Recommendations may be updated based on patient status, additional functional criteria and insurance authorization.  Follow Up Recommendations       Assistance Recommended at Discharge Intermittent Supervision/Assistance  Patient can return home with the following Assist for transportation;Assistance with cooking/housework   Equipment Recommendations  None recommended by PT    Recommendations for Other Services       Precautions / Restrictions Precautions Precautions: Fall;Cervical;Back Precaution Booklet Issued: Yes (comment) Precaution Comments: verbally reviewed cervical precautions Required Braces or Orthoses: Other Brace Other Brace: per pt he did not have a cervical collar/brace from neck  surgery Restrictions Weight Bearing Restrictions: No     Mobility  Bed Mobility Overal bed mobility: Modified Independent Bed Mobility: Rolling, Sidelying to Sit Rolling: Modified independent (Device/Increase time) Sidelying to sit: Modified independent (Device/Increase time)       General bed mobility comments: log rolls adhering to precautions    Transfers Overall transfer level: Needs assistance Equipment used: None Transfers: Sit to/from Stand Sit to Stand: Supervision           General transfer comment: No LOB and no assist needed    Ambulation/Gait Ambulation/Gait assistance: Supervision Gait Distance (Feet): 500 Feet Assistive device: None Gait Pattern/deviations: Step-through pattern, Decreased stride length   Gait velocity interpretation: 1.31 - 2.62 ft/sec, indicative of limited community ambulator   General Gait Details: No unsteadiness, pt states only issue is he has right foot pain due to plantar fascitis.  Pt able to withstand mod challenges to balance.   Stairs             Wheelchair Mobility    Modified Rankin (Stroke Patients Only)       Balance Overall balance assessment: Needs assistance Sitting-balance support: Feet supported Sitting balance-Leahy Scale: Normal     Standing balance support: No upper extremity supported, During functional activity Standing balance-Leahy Scale: Fair                              Cognition Arousal/Alertness: Awake/alert Behavior During Therapy: Flat affect Overall Cognitive Status: Impaired/Different from baseline Area of Impairment: Following commands, Memory                     Memory: Decreased short-term  memory Following Commands: Follows multi-step commands consistently                Exercises Other Exercises Other Exercises: Hanging heels off step to strretch right foot Other Exercises: Roll right foot on can or hard ball    General Comments General  comments (skin integrity, edema, etc.): VSS.  With use of bed, assessed canals for BPPV using trendelenberg to get positioin as pt with cervical precautions. could not elicit symptoms or nystagmus.  Upon sitting, pt had no symptoms nor with ambulation.  Educated pt that he didnt need to continue any vestibular exercises unless dizziness returns. Discussed his plantar fascitis and gave him some stretches for home as well as recommended a brace to wear at night for stretching.      Pertinent Vitals/Pain Pain Assessment Pain Assessment: No/denies pain    Home Living                          Prior Function            PT Goals (current goals can now be found in the care plan section) Acute Rehab PT Goals Patient Stated Goal: feel better, more like himself Progress towards PT goals: Progressing toward goals    Frequency    Min 1X/week      PT Plan Current plan remains appropriate    Co-evaluation              AM-PAC PT "6 Clicks" Mobility   Outcome Measure  Help needed turning from your back to your side while in a flat bed without using bedrails?: None Help needed moving from lying on your back to sitting on the side of a flat bed without using bedrails?: None Help needed moving to and from a bed to a chair (including a wheelchair)?: None Help needed standing up from a chair using your arms (e.g., wheelchair or bedside chair)?: None Help needed to walk in hospital room?: A Little Help needed climbing 3-5 steps with a railing? : A Little 6 Click Score: 22    End of Session Equipment Utilized During Treatment: Gait belt Activity Tolerance: Patient tolerated treatment well Patient left: in bed;with call bell/phone within reach Nurse Communication: Mobility status PT Visit Diagnosis: Muscle weakness (generalized) (M62.81);Difficulty in walking, not elsewhere classified (R26.2)     Time: 1610-9604 PT Time Calculation (min) (ACUTE ONLY): 24 min  Charges:   $Gait Training: 8-22 mins $Therapeutic Exercise: 8-22 mins                     [name],PT Acute Rehab Services [phone]    [name] 08/04/2022, 11:17 AM

## 2022-08-04 NOTE — Progress Notes (Signed)
Discharge instructions given. Patient verbalized understanding and all questions were answered.  ?

## 2022-08-04 NOTE — Discharge Summary (Signed)
Physician Discharge Summary  Dillon Macdonald ZOX:[id number] DOB: 1971-03-13 DOA: 07/29/2022  PCP: Pcp, No  Admit date: 07/29/2022 Discharge date: 08/04/2022  Admitted From: Home Disposition:  Home  Recommendations for Outpatient Follow-up:  Follow up with PCP in 1-2 weeks Please obtain BMP/CBC in one week Follow-up with GI in 4 to 6 weeks  Home Health:Yes Equipment/Devices:None  Discharge Condition:Stable CODE STATUS:Full Diet recommendation: Heart Healthy  Brief/Interim Summary: 52 y.o. male past medical history of neck surgery on NSAIDs was admitted for acute GI bleed, EGD revealed Dieulafoy lesion.   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 Hemoglobin at 8.9. He will go home on ferrous sulfate once a day.  History of cervical radiculopathy: Follow-up with neurosurgery as an outpatient continue Flexeril and gabapentin.  History of anxiety: Noted.  Migraine headaches: Patient Was insisting that sumatriptan was not working, and demanded a CT scan of the head which was not indicated and was done showed no acute findings, headache resolved after CT scan.   Discharge Instructions  Discharge Instructions     Diet - low sodium heart healthy   Complete by: As directed    Increase activity slowly   Complete by: As directed       Allergies as of 08/04/2022       Reactions   Lactose Intolerance (gi) Other (See Comments)   Sildenafil Other (See Comments)   Blurry vision        Medication List     TAKE these medications    aspirin EC 81 MG tablet Take 1 tablet (81 mg total) by mouth daily.   busPIRone 10 MG tablet Commonly known as: BUSPAR Take  10 mg by mouth daily as needed (Anxiety).   cyclobenzaprine 10 MG tablet Commonly known as: FLEXERIL Take 1 tablet (10 mg total) by mouth 3 (three) times daily as needed for muscle spasms.   ferrous sulfate 325 (65 FE) MG EC tablet Take 1 tablet (325 mg total) by mouth daily with breakfast.   gabapentin 100 MG capsule Commonly known as: Neurontin Take 1 capsule (100 mg total) by mouth 3 (three) times daily.   HYDROcodone-acetaminophen 5-325 MG tablet Commonly known as: NORCO/VICODIN Take 1-2 tablets by mouth every 4 (four) hours as needed for moderate pain ((score 4 to 6)). What changed:  how much to take when to take this   pantoprazole 40 MG tablet Commonly known as: PROTONIX Take 1 tablet (40 mg total) by mouth 2 (two) times daily.   tamsulosin 0.4 MG Caps capsule Commonly known as: FLOMAX Take 1 capsule (0.4 mg total) by mouth daily.        Allergies  Allergen Reactions   Lactose Intolerance (Gi) Other (See Comments)   Sildenafil Other (See Comments)    Blurry vision    Consultations: Enterology   Procedures/Studies: CT HEAD WO CONTRAST ( )  Result Date: 08/03/2022 CLINICAL DATA:  Headache, new onset. EXAM: CT HEAD WITHOUT CONTRAST TECHNIQUE: Contiguous axial images were obtained from the base of the skull through the vertex without intravenous contrast. RADIATION DOSE REDUCTION: This exam was performed according to the departmental dose-optimization program which includes automated exposure control, adjustment of the mA  and/or kV according to patient size and/or use of iterative reconstruction technique. COMPARISON:  Head CT 07/18/2008. FINDINGS: Brain: No acute intracranial hemorrhage. Gray-white differentiation is preserved. No hydrocephalus or extra-axial collection. No mass effect or midline shift. Vascular: No hyperdense vessel or unexpected calcification. Skull: No calvarial fracture or suspicious bone lesion. Skull base is unremarkable. Sinuses/Orbits:  Unremarkable. Other: None. IMPRESSION: No acute intracranial abnormality. No findings to explain headaches. Electronically Signed   By: Orvan Falconer M.D.   On: 08/03/2022 09:52   DG CHEST PORT 1 VIEW  Result Date: 07/29/2022 CLINICAL DATA:  Intubation EXAM: PORTABLE CHEST 1 VIEW COMPARISON:  05/21/2022 FINDINGS: Endotracheal tube terminates 4.5 cm above the carina. Lungs are clear No pleural effusion or pneumothorax. The heart is normal in size. Cervical spine fixation hardware. IMPRESSION: Endotracheal tube terminates 4.5 cm above the carina. Electronically Signed   By: Charline Bills M.D.   On: 07/29/2022 20:46   DG Cervical Spine 2 or 3 views  Result Date: 07/27/2022 CLINICAL DATA:  Micro discectomy.  Operative imaging. EXAM: CERVICAL SPINE - 2-3 VIEW COMPARISON:  05/28/2022. FINDINGS: Lateral portable imaging of the cervical spine. Images demonstrate placement posterior retractors, posterior to the upper thoracic spine, that is not well visualized due to overlying soft tissues. Stable appearing anterior fusion plate, fixation screws and radiolucent disc spacer lies at the C6-C7 level. IMPRESSION: Portable lateral cervical spine imaging for micro discectomy. Electronically Signed   By: Amie Portland M.D.   On: 07/27/2022 15:45   (Echo, Carotid, EGD, Colonoscopy, ERCP)    Subjective: No complaints  Discharge Exam: Vitals:   08/03/22 2328 08/04/22 0438  BP: 99/66 101/66  Pulse: 86 79  Resp: 16 18  Temp: 97.6 F (36.4 C) 97.6 F (36.4 C)  SpO2: 95% 98%   Vitals:   08/03/22 1857 08/03/22 2002 08/03/22 2328 08/04/22 0438  BP: 97/75 100/75 99/66 101/66  Pulse: 97 (!) 118 86 79  Resp: Temp:  97.9 F (36.6 C) 97.6 F (36.4 C) 97.6 F (36.4 C)  TempSrc:  Oral Oral Oral  SpO2: 100% 97% 95% 98%  Weight:    75.4 kg    General: Pt is alert, awake, not in acute distress Cardiovascular: RRR, S1/S2 +, no rubs, no gallops Respiratory: CTA bilaterally, no wheezing, no  rhonchi Abdominal: Soft, NT, ND, bowel sounds + Extremities: no edema, no cyanosis    The results of significant diagnostics from this hospitalization (including imaging, microbiology, ancillary and laboratory) are listed below for reference.     Microbiology: Recent Results (from the past 240 hour(s))  MRSA Next Gen by PCR, Nasal     Status: None   Collection Time: 07/29/22  7:55 PM   Specimen: Nasal Mucosa; Nasal Swab  Result Value Ref Range Status   MRSA by PCR Next Gen NOT DETECTED NOT DETECTED Final    Comment: (NOTE) The GeneXpert MRSA Assay (FDA approved for NASAL specimens only), is one component of a comprehensive MRSA colonization surveillance program. It is not intended to diagnose MRSA infection nor to guide or monitor treatment for MRSA infections. Test performance is not FDA approved in patients less than 84 years old. Performed at [facility], [address], [place], [place] [address]      Labs: BNP (last 3 results) No results for input(s): "BNP" in the last 8760 hours. Basic Metabolic Panel: Recent Labs  Lab 07/29/22 1733 07/29/22 1737 07/29/22 2335 07/30/22 0752 07/31/22 0131  NA 137  138 136 140 136  K 4.2 4.3 6.0* 4.0 3.9  CL 106 103  --  107 100  CO2 24  --   --  26 27  GLUCOSE 169* 167*  --  104* 95  BUN 44* 42*  --  26* 14  CREATININE 1.03 1.00  --  0.99 0.89  CALCIUM 7.9*  --   --  7.9* 7.9*  MG  --   --   --  1.7 1.9  PHOS  --   --   --  2.9  --    Liver Function Tests: Recent Labs  Lab 07/29/22 1733  AST 20  ALT 14  ALKPHOS 34*  BILITOT 0.4  PROT 4.6*  ALBUMIN 2.9*   No results for input(s): "LIPASE", "AMYLASE" in the last 168 hours. No results for input(s): "AMMONIA" in the last 168 hours. CBC: Recent Labs  Lab 07/29/22 1733 07/29/22 1737 07/30/22 0752 07/30/22 1411 07/30/22 1757 07/31/22 1123 08/01/22 0128 08/02/22 0114 08/03/22 0121  WBC 9.9  --  5.7  --   --   --  4.3 4.0 3.5*  NEUTROABS 7.3  --   --   --    --   --  3.0 2.7 2.0  HGB 8.8*   < > 8.6* 9.1* 9.0* 7.9* 8.1* 8.0* 8.9*  HCT 28.0*   < > 25.6* 26.3* 25.8*  --  23.5* 23.7* 26.8*  MCV 94.9  --  88.3  --   --   --  88.0 89.4 89.0  PLT 135*  --  103*  --   --   --  98* 113* 130*   < > = values in this interval not displayed.   Cardiac Enzymes: No results for input(s): "CKTOTAL", "CKMB", "CKMBINDEX", "TROPONINI" in the last 168 hours. BNP: Invalid input(s): "POCBNP" CBG: Recent Labs  Lab 07/29/22 2337 07/30/22 0305 07/30/22 0715 07/30/22 1129 07/30/22 1521  GLUCAP 127* 105* 98 81 98   D-Dimer No results for input(s): "DDIMER" in the last 72 hours. Hgb A1c No results for input(s): "HGBA1C" in the last 72 hours. Lipid Profile No results for input(s): "CHOL", "HDL", "LDLCALC", "TRIG", "CHOLHDL", "LDLDIRECT" in the last 72 hours. Thyroid function studies No results for input(s): "TSH", "T4TOTAL", "T3FREE", "THYROIDAB" in the last 72 hours.  Invalid input(s): "FREET3" Anemia work up No results for input(s): "VITAMINB12", "FOLATE", "FERRITIN", "TIBC", "IRON", "RETICCTPCT" in the last 72 hours. Urinalysis    Component Value Date/Time   COLORURINE YELLOW 10/18/2017 1644   APPEARANCEUR CLEAR 10/18/2017 1644   LABSPEC 1.025 10/18/2017 1644   PHURINE 5.0 10/18/2017 1644   GLUCOSEU NEGATIVE 10/18/2017 1644   HGBUR NEGATIVE 10/18/2017 1644   BILIRUBINUR NEGATIVE 10/18/2017 1644   KETONESUR NEGATIVE 10/18/2017 1644   PROTEINUR NEGATIVE 10/18/2017 1644   NITRITE NEGATIVE 10/18/2017 1644   LEUKOCYTESUR NEGATIVE 10/18/2017 1644   Sepsis Labs Recent Labs  Lab 07/30/22 0752 08/01/22 0128 08/02/22 0114 08/03/22 0121  WBC 5.7 4.3 4.0 3.5*   Microbiology Recent Results (from the past 240 hour(s))  MRSA Next Gen by PCR, Nasal     Status: None   Collection Time: 07/29/22  7:55 PM   Specimen: Nasal Mucosa; Nasal Swab  Result Value Ref Range Status   MRSA by PCR Next Gen NOT DETECTED NOT DETECTED Final    Comment: (NOTE) The  GeneXpert MRSA Assay (FDA approved for NASAL specimens only), is one component of a comprehensive MRSA colonization surveillance program. It is not intended to diagnose MRSA infection  nor to guide or monitor treatment for MRSA infections. Test performance is not FDA approved in patients less than 70 years old. Performed at [facility], [address], [place], [place] [address]      SIGNED:   Marinda Elk, MD  [facility] 08/04/2022, 8:40 AM Pager   If 7PM-7AM, please contact night-coverage [email or web address] Password [email or web address]

## 2022-08-12 ENCOUNTER — Ambulatory Visit: Payer: Medicaid Other | Attending: Internal Medicine

## 2022-08-12 DIAGNOSIS — R2681 Unsteadiness on feet: Secondary | ICD-10-CM | POA: Insufficient documentation

## 2022-08-12 DIAGNOSIS — R42 Dizziness and giddiness: Secondary | ICD-10-CM

## 2022-08-12 NOTE — Therapy (Signed)
OUTPATIENT PHYSICAL THERAPY VESTIBULAR EVALUATION     Patient Name: Dillon Macdonald MRN: [id number] DOB:Jun 03, 1970, 52 y.o., male Today's Date: 08/12/2022  END OF SESSION:  PT End of Session - 08/12/22 1219     Visit Number 1    Number of Visits 2    Date for PT Re-Evaluation 09/09/22    Authorization Type [place] med[place] prepaid [employer]    PT Start Time 1231    PT Stop Time 1315    PT Time Calculation (min) 44 min    Activity Tolerance Patient tolerated treatment well    Behavior During Therapy Flat affect             Past Medical History:  Diagnosis Date   Arthritis    Atypical chest pain 01/17/2016   Back pain    Herpes simplex    Hypogonadism male    Shortness of breath    feels that way all the time, ? pain or Percocet   Thrombocytopenia (HCC) 2014   Tobacco abuse 05/18/2016   Past Surgical History:  Procedure Laterality Date   BACK SURGERY     lumbar fusion L3- 4, S 1   CERVICAL FUSION  2014   ESOPHAGOGASTRODUODENOSCOPY N/A 07/29/2022   Procedure: ESOPHAGOGASTRODUODENOSCOPY (EGD);  Surgeon: Sherrilyn Rist, MD;  Location: [facility] ENDOSCOPY;  Service: Gastroenterology;  Laterality: N/A;   ESOPHAGOGASTRODUODENOSCOPY N/A 07/30/2022   Procedure: ESOPHAGOGASTRODUODENOSCOPY (EGD);  Surgeon: Beverley Fiedler, MD;  Location[facility] ENDOSCOPY;  Service: Gastroenterology;  Laterality: N/A;   FOREIGN BODY REMOVAL  07/29/2022   Procedure: FOREIGN BODY REMOVAL;  Surgeon: Sherrilyn Rist, MD;  Location: [facility] ENDOSCOPY;  Service: Gastroenterology;;   HOT HEMOSTASIS N/A 07/30/2022   Procedure: HOT HEMOSTASIS (ARGON PLASMA COAGULATION/BICAP);  Surgeon: Beverley Fiedler, MD;  Location: [facility] ENDOSCOPY;  Service: Gastroenterology;  Laterality: N/A;   POSTERIOR CERVICAL LAMINECTOMY Left 07/27/2022   Procedure: Microdiscectomy - left Cervical seven-Thoracic one;  Surgeon: Julio Sicks, MD;  Location: [facility];  Service: Neurosurgery;  Laterality: Left;   SUBMUCOSAL INJECTION  07/30/2022   Procedure:  SUBMUCOSAL INJECTION;  Surgeon: Beverley Fiedler, MD;  Location: [facility] ENDOSCOPY;  Service: Gastroenterology;;   VASECTOMY     Patient Active Problem List   Diagnosis Date Noted   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 07/30/2022   Upper GI bleed 07/29/2022   Hematemesis with nausea 07/29/2022   Blood loss anemia 07/29/2022   Melena 07/29/2022   Cervical radiculopathy 07/27/2022   Tobacco abuse 05/18/2016   Atypical chest pain 01/17/2016   Hypogonadism male    Spinal stenosis, lumbar region, with neurogenic claudication 11/06/2013   Lumbar stenosis with neurogenic claudication 11/06/2013   Thrombocytopenia, unspecified (HCC) 12/21/2012    PCP: none reported REFERRING PROVIDER: Marinda Elk, MD  REFERRING DIAG: K92.2 (ICD-10-CM) - GIB (gastrointestinal bleeding)   THERAPY DIAG:  Unsteadiness on feet  Dizziness and giddiness  ONSET DATE: 08/04/2022  referral  Rationale for Evaluation and Treatment: Rehabilitation  SUBJECTIVE:   SUBJECTIVE STATEMENT: Patient arrive to clinic alone, no AD. A few months ago, had c-spine surgery. Still having L UE numbness. Morning after surgery, woke up very dizzy, passed out, felt ill, fell multiple times. Threw up a lot of blood. 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 At the hospital he reports a "nurse said his eyes were twitching." Has never had spinning dizziness, only feeling lightheaded. Has a long history of L ear "problems." C4-5 fused, T1-L5 fused per patient.  Patient does report being able to hear his own voice, heartbeat, chewing, etc in L ear and will wake up with "clear fluid" on his pillow coming from his L ear. Patient also reports consistent decline in memory over the past 3-4 months, citing forgetting to do his taxes and difficultly remembering whether he took his meds or not (now has a pill box).  Pt accompanied by: self  PERTINENT HISTORY: 4 back surgeries, including c-spine 4/15  PAIN:  Are you  having pain? Yes: NPRS scale: 3/10 Pain location: posterior neck related to recent sx Pain description: soreness  PRECAUTIONS: Cervical and Fall  WEIGHT BEARING RESTRICTIONS: No  FALLS: Has patient fallen in last 6 months? Yes. Number of falls 7-8x all related to passing out  LIVING ENVIRONMENT: Lives with: lives alone Lives in: House/apartment Stairs: No Has following equipment at home: Single point cane  PLOF: Independent  PATIENT GOALS: "I just want to get better"  OBJECTIVE:   DIAGNOSTIC FINDINGS: 08/03/22 head CT IMPRESSION: No acute intracranial abnormality. No findings to explain headaches.  COGNITION: Overall cognitive status: Within functional limits for tasks assessed   SENSATION: L UE deficits due to C-spine sx  Cervical ROM:   Grossly limited due to c-spine sx  STRENGTH: WFL LE  BED MOBILITY:  Independent   TRANSFERS: Assistive device utilized: None  Sit to stand: Complete Independence Stand to sit: Complete Independence GAIT: Gait pattern: WFL Distance walked: clinic Assistive device utilized: None Level of assistance: Complete Independence Comments: slow gait speed for age  VESTIBULAR ASSESSMENT:  GENERAL OBSERVATION: NAD, no AD   SYMPTOM BEHAVIOR:  Subjective history: see above  Non-Vestibular symptoms: changes in vision, diplopia, headaches, nausea/vomiting, and loss of consciousness  Type of dizziness: Lightheadedness/Faint  Frequency: varies  Duration: varies  Aggravating factors: Induced by position change: supine to sit and sit to stand  Relieving factors: rest and slow movements  Progression of symptoms: unchanged  Deferred due to very recent c-spine surgery   VESTIBULAR TREATMENT:                                                                                                   N/A eval  PATIENT EDUCATION: Education details: neurology contact info, c-spine precautions as it relates to vestibular eval, potential non-vestibular  causes for dizziness, social work information Person educated: Patient Education method: Chief Technology Officer Education comprehension: verbalized understanding and needs further education  HOME EXERCISE PROGRAM:  GOALS: Goals reviewed with patient? Yes  SHORT TERM GOALS: = LTG based on PT POC length LONG TERM GOALS: Target date: 09/09/22  Patient will participate in formal vestibular evaluation once c-spine surgery is healed Baseline: to be completed Goal status: INITIAL   ASSESSMENT:  CLINICAL IMPRESSION: Patient is a 52 y.o. male who was seen today for physical therapy evaluation and treatment for dizziness. His symptoms are intermittent and caused primarily by rapid positional changes. He has had multiple major surgeries in the past few weeks as well as known significant blood loss (GIB), which may also contribute to his dizziness. Due to recent c-spine surgery and apparent limited  tolerance to it (developed dizziness morning after surgery), unable to complete formal vestibular eval. PT unable to rule out vestibular contributions to his experience of dizziness. Patient would benefit from at least 1 more PT visit to allow for formal assessment of vestibular system as deemed appropriate.    OBJECTIVE IMPAIRMENTS: decreased cognition, decreased knowledge of condition, difficulty walking, dizziness, impaired UE functional use, impaired vision/preception, and pain.   ACTIVITY LIMITATIONS: carrying, lifting, bending, squatting, sleeping, stairs, bed mobility, locomotion level, and caring for others  PARTICIPATION LIMITATIONS: meal prep, cleaning, laundry, interpersonal relationship, driving, shopping, community activity, occupation, and yard work  PERSONAL FACTORS: Behavior pattern, Education, Financial risk analyst, Past/current experiences, Profession, Sex, Social background, Transportation, and 1-2 comorbidities: see above  are also affecting patient's functional outcome.   REHAB POTENTIAL: Fair  unknown etiology, recent medical complications  CLINICAL DECISION MAKING: Unstable/unpredictable  EVALUATION COMPLEXITY: High   PLAN:  PT FREQUENCY: 1x/month  PT DURATION: 4 weeks  PLANNED INTERVENTIONS: Therapeutic exercises, Therapeutic activity, Neuromuscular re-education, Balance training, Gait training, Patient/Family education, Self Care, Joint mobilization, Vestibular training, Canalith repositioning, Visual/preceptual remediation/compensation, DME instructions, Aquatic Therapy, Manual therapy, and Re-evaluation  PLAN FOR NEXT SESSION: vestibular eval   Westley Foots, PT, DPT, CBIS 08/12/2022, 3:01 PM

## 2022-08-31 ENCOUNTER — Other Ambulatory Visit (HOSPITAL_COMMUNITY): Payer: Self-pay

## 2022-08-31 ENCOUNTER — Other Ambulatory Visit: Payer: Self-pay

## 2022-09-02 ENCOUNTER — Other Ambulatory Visit (HOSPITAL_COMMUNITY): Payer: Self-pay

## 2022-09-04 ENCOUNTER — Other Ambulatory Visit (HOSPITAL_COMMUNITY): Payer: Self-pay

## 2022-09-08 ENCOUNTER — Ambulatory Visit: Payer: Medicaid Other | Admitting: Physical Therapy

## 2022-09-08 VITALS — BP 125/82 | HR 84

## 2022-09-08 DIAGNOSIS — R42 Dizziness and giddiness: Secondary | ICD-10-CM

## 2022-09-08 DIAGNOSIS — R2681 Unsteadiness on feet: Secondary | ICD-10-CM | POA: Diagnosis not present

## 2022-09-08 NOTE — Therapy (Signed)
OUTPATIENT PHYSICAL THERAPY VESTIBULAR TREATMENT NOTE     Patient Name: Dillon Macdonald MRN: [id number] DOB:1970-07-03, 52 y.o., male Today's Date: 09/09/2022  END OF SESSION:  PT End of Session - 09/09/22 1821     Visit Number 2    Number of Visits 2    Date for PT Re-Evaluation 09/09/22    Authorization Type [place] medicaid prepaid BCBS    PT Start Time 1147    PT Stop Time 1230    PT Time Calculation (min) 43 min    Activity Tolerance Patient tolerated treatment well    Behavior During Therapy [facility] for tasks assessed/performed              Past Medical History:  Diagnosis Date   Arthritis    Atypical chest pain 01/17/2016   Back pain    Herpes simplex    Hypogonadism male    Shortness of breath    feels that way all the time, ? pain or Percocet   Thrombocytopenia (HCC) 2014   Tobacco abuse 05/18/2016   Past Surgical History:  Procedure Laterality Date   BACK SURGERY     lumbar fusion L3- 4, S 1   CERVICAL FUSION  2014   ESOPHAGOGASTRODUODENOSCOPY N/A 07/29/2022   Procedure: ESOPHAGOGASTRODUODENOSCOPY (EGD);  Surgeon: Sherrilyn Rist, MD;  Location: [facility] ENDOSCOPY;  Service: Gastroenterology;  Laterality: N/A;   ESOPHAGOGASTRODUODENOSCOPY N/A 07/30/2022   Procedure: ESOPHAGOGASTRODUODENOSCOPY (EGD);  Surgeon: Beverley Fiedler, MD;  Location: [facility] ENDOSCOPY;  Service: Gastroenterology;  Laterality: N/A;   FOREIGN BODY REMOVAL  07/29/2022   Procedure: FOREIGN BODY REMOVAL;  Surgeon: Sherrilyn Rist, MD;  Location: [facility] ENDOSCOPY;  Service: Gastroenterology;;   HOT HEMOSTASIS N/A 07/30/2022   Procedure: HOT HEMOSTASIS (ARGON PLASMA COAGULATION/BICAP);  Surgeon: Beverley Fiedler, MD;  Location: [facility] ENDOSCOPY;  Service: Gastroenterology;  Laterality: N/A;   POSTERIOR CERVICAL LAMINECTOMY Left 07/27/2022   Procedure: Microdiscectomy - left Cervical seven-Thoracic one;  Surgeon: Julio Sicks, MD;  Location: [facility] OR;  Service: Neurosurgery;  Laterality: Left;   SUBMUCOSAL INJECTION   07/30/2022   Procedure: SUBMUCOSAL INJECTION;  Surgeon: Beverley Fiedler, MD;  Location: [facility] ENDOSCOPY;  Service: Gastroenterology;;   VASECTOMY     Patient Active Problem List   Diagnosis Date Noted   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 07/30/2022   Upper GI bleed 07/29/2022   Hematemesis with nausea 07/29/2022   Blood loss anemia 07/29/2022   Melena 07/29/2022   Cervical radiculopathy 07/27/2022   Tobacco abuse 05/18/2016   Atypical chest pain 01/17/2016   Hypogonadism male    Spinal stenosis, lumbar region, with neurogenic claudication 11/06/2013   Lumbar stenosis with neurogenic claudication 11/06/2013   Thrombocytopenia, unspecified (HCC) 12/21/2012    PCP: none reported REFERRING PROVIDER: Marinda Elk, MD  REFERRING DIAG: K92.2 (ICD-10-CM) - GIB (gastrointestinal bleeding)   THERAPY DIAG:  Dizziness and giddiness  ONSET DATE: 08/04/2022  referral  Rationale for Evaluation and Treatment: Rehabilitation  SUBJECTIVE:   SUBJECTIVE STATEMENT: Patient reports he got dizzy yesterday when he was picking up his dog's toy off the ground - lasted about 10 secs; says he gets dizzy if he bends down and returns to standing; pt reports he has drainage in Lt ear - started many months ago.  Pt reports the dizziness really only occurs if he bends down and returns to upright; states his labs are off - thinks the dizziness may be coming from medical issues/blood loss that occurred after his  cervical surgery in mid April Pt accompanied by: self  PERTINENT HISTORY: 4 back surgeries, including c-spine 4/15  PAIN:  Are you having pain? Yes: NPRS scale: 3/10 Pain location: posterior neck related to recent sx Pain description: soreness  and sharp, stabbing on Rt side of neck/upper trap  PRECAUTIONS: Cervical and Fall  WEIGHT BEARING RESTRICTIONS: No  FALLS: Has patient fallen in last 6 months? Yes. Number of falls 7-8x all related to passing out  LIVING  ENVIRONMENT: Lives with: lives alone Lives in: House/apartment Stairs: No Has following equipment at home: Single point cane  PLOF: Independent  PATIENT GOALS: "I just want to get better"  OBJECTIVE:     09/09/22 0001  Orthostatics  BP supine (x 5 minutes) 125/80  HR supine (x 5 minutes) 70  BP sitting 129/82 (moderate light headedness with supine to sit)  HR sitting 67  BP standing (after 1 minute) 105/77  HR standing (after 1 minute) 96  BP standing (after 3 minutes) 127/88  HR standing (after 3 minutes) 94    GAIT: Gait pattern: WFL Distance walked: clinic Assistive device utilized: None Level of assistance: Complete Independence Comments: no device used  VESTIBULAR ASSESSMENT:  Oculomotor testing - smooth pursuits horizontal and vertical WNL's with no nystagmus and no c/o dizziness with testing Saccades - WNL's with no nystagmus and no c/o dizziness  Positional testing - Rt and Lt Dix-Hallpike tests negative for nystagmus and c/o true spinning vertigo  PATIENT EDUCATION: Education details:  pt educated in techniques to increase BP prior to transfers - ankle pumps in seated position, marching in standing position Person educated: Patient Education method: Chief Technology Officer Education comprehension: verbalized understanding and needs further education  HOME EXERCISE PROGRAM:  GOALS: Goals reviewed with patient? Yes  SHORT TERM GOALS: = LTG based on PT POC length LONG TERM GOALS: Target date: 09/09/22  Patient will participate in formal vestibular evaluation once c-spine surgery is healed Baseline: to be completed Goal status: Goal met 09-08-22   ASSESSMENT:  CLINICAL IMPRESSION: Pt's c/o dizziness primarily occurs with bending down and returning to upright position - BP readings do indicate orthostatic hypotension as BP dropped from 129/82 to 105/77 from seated to standing transfer.  Symptoms do not appear to be of vestibular system dysfunction -  appears to be related to medical issues at this time.  Pt has been recommended to follow up with MD for further medical management.  D/C as no other needs identified at this time.    OBJECTIVE IMPAIRMENTS: decreased cognition, decreased knowledge of condition, difficulty walking, dizziness, impaired UE functional use, impaired vision/preception, and pain.   ACTIVITY LIMITATIONS: carrying, lifting, bending, squatting, sleeping, stairs, bed mobility, locomotion level, and caring for others  PARTICIPATION LIMITATIONS: meal prep, cleaning, laundry, interpersonal relationship, driving, shopping, community activity, occupation, and yard work  PERSONAL FACTORS: Behavior pattern, Education, Financial risk analyst, Past/current experiences, Profession, Sex, Social background, Transportation, and 1-2 comorbidities: see above  are also affecting patient's functional outcome.   REHAB POTENTIAL: Fair unknown etiology, recent medical complications  CLINICAL DECISION MAKING: Unstable/unpredictable  EVALUATION COMPLEXITY: High   PLAN:  PT FREQUENCY: 1x/month  PT DURATION: 4 weeks  PLANNED INTERVENTIONS: Therapeutic exercises, Therapeutic activity, Neuromuscular re-education, Balance training, Gait training, Patient/Family education, Self Care, Joint mobilization, Vestibular training, Canalith repositioning, Visual/preceptual remediation/compensation, DME instructions, Aquatic Therapy, Manual therapy, and Re-evaluation  PLAN FOR NEXT SESSION: D/C 09-08-22  - dizziness appears to be due to medical issues/orthostatic hypotension    Kadi Hession, Donavan Burnet, PT  09/09/2022, 6:23 PM

## 2022-09-09 ENCOUNTER — Encounter: Payer: Self-pay | Admitting: Physical Therapy

## 2022-09-09 NOTE — Progress Notes (Signed)
   09/09/22 0001  Orthostatics  BP supine (x 5 minutes) 125/80  HR supine (x 5 minutes) 70  BP sitting 129/82 (moderate light headedness with supine to sit)  HR sitting 67  BP standing (after 1 minute) 105/77  HR standing (after 1 minute) 96  BP standing (after 3 minutes) 127/88  HR standing (after 3 minutes) 94

## 2022-09-09 NOTE — Progress Notes (Signed)
   09/09/22 0001  Orthostatics  BP supine (x 5 minutes) 125/80  HR supine (x 5 minutes) 70  BP sitting 129/82 (moderate light headedness with supine to sit)  HR sitting 67  BP standing (after 1 minute) 105/77  HR standing (after 1 minute) 96  BP standing (after 3 minutes) 127/88  HR standing (after 3 minutes) 94    

## 2022-09-28 ENCOUNTER — Other Ambulatory Visit: Payer: Self-pay

## 2022-09-28 ENCOUNTER — Other Ambulatory Visit (HOSPITAL_COMMUNITY): Payer: Self-pay

## 2022-09-29 ENCOUNTER — Other Ambulatory Visit (HOSPITAL_COMMUNITY): Payer: Self-pay

## 2022-11-04 ENCOUNTER — Other Ambulatory Visit (HOSPITAL_COMMUNITY): Payer: Self-pay

## 2022-12-11 ENCOUNTER — Other Ambulatory Visit: Payer: Self-pay | Admitting: Neurosurgery

## 2022-12-21 NOTE — Pre-Procedure Instructions (Signed)
Surgical Instructions   Your procedure is scheduled on December 28, 2022. Report to [facility] Main Entrance "A" at 8:20 A.M., then check in with the Admitting office. Any questions or running late day of surgery: call [phone]  Questions prior to your surgery date: call [phone], Monday-Friday, 8am-4pm. If you experience any cold or flu symptoms such as cough, fever, chills, shortness of breath, etc. between now and your scheduled surgery, please notify us at the above number.     Remember:  Do not eat or drink after midnight the night before your surgery     Take these medicines the morning of surgery with A SIP OF WATER: tamsulosin [facility])    May take these medicines IF NEEDED: HYDROcodone-acetaminophen (NORCO)    One week prior to surgery, STOP taking any Aspirin (unless otherwise instructed by your surgeon) Aleve, Naproxen, Ibuprofen, Motrin, Advil, Goody's, BC's, all herbal medications, fish oil, and non-prescription vitamins.                     Do NOT Smoke (Tobacco/Vaping) for 24 hours prior to your procedure.  If you use a CPAP at night, you may bring your mask/headgear for your overnight stay.   You will be asked to remove any contacts, glasses, piercing's, hearing aid's, dentures/partials prior to surgery. Please bring cases for these items if needed.    Patients discharged the day of surgery will not be allowed to drive home, and someone needs to stay with them for 24 hours.  SURGICAL WAITING ROOM VISITATION Patients may have no more than 2 support people in the waiting area - these visitors may rotate.   Pre-op nurse will coordinate an appropriate time for 1 ADULT support person, who may not rotate, to accompany patient in pre-op.  Children under the age of 62 must have an adult with them who is not the patient and must remain in the main waiting area with an adult.  If the patient needs to stay at the hospital during part of their recovery, the visitor  guidelines for inpatient rooms apply.  Please refer to the [facility] website for the visitor guidelines for any additional information.   If you received a COVID test during your pre-op visit  it is requested that you wear a mask when out in public, stay away from anyone that may not be feeling well and notify your surgeon if you develop symptoms. If you have been in contact with anyone that has tested positive in the last 10 days please notify you surgeon.      Pre-operative 5 CHG Bathing Instructions   You can play a key role in reducing the risk of infection after surgery. Your skin needs to be as free of germs as possible. You can reduce the number of germs on your skin by washing with CHG (chlorhexidine gluconate) soap before surgery. CHG is an antiseptic soap that kills germs and continues to kill germs even after washing.   DO NOT use if you have an allergy to chlorhexidine/CHG or antibacterial soaps. If your skin becomes reddened or irritated, stop using the CHG and notify one of our RNs at [phone].   Please shower with the CHG soap starting 4 days before surgery using the following schedule:     Please keep in mind the following:  DO NOT shave, including legs and underarms, starting the day of your first shower.   You may shave your face at any point before/day of surgery.  Place clean sheets on your bed the day you start using CHG soap. Use a clean washcloth (not used since being washed) for each shower. DO NOT sleep with pets once you start using the CHG.   CHG Shower Instructions:  If you choose to wash your hair and private area, wash first with your normal shampoo/soap.  After you use shampoo/soap, rinse your hair and body thoroughly to remove shampoo/soap residue.  Turn the water OFF and apply about 3 tablespoons (45 ml) of CHG soap to a CLEAN washcloth.  Apply CHG soap ONLY FROM YOUR NECK DOWN TO YOUR TOES (washing for 3-5 minutes)  DO NOT use CHG soap on face,  private areas, open wounds, or sores.  Pay special attention to the area where your surgery is being performed.  If you are having back surgery, having someone wash your back for you may be helpful. Wait 2 minutes after CHG soap is applied, then you may rinse off the CHG soap.  Pat dry with a clean towel  Put on clean clothes/pajamas   If you choose to wear lotion, please use ONLY the CHG-compatible lotions on the back of this paper.   Additional instructions for the day of surgery: DO NOT APPLY any lotions, deodorants, cologne, or perfumes.   Do not bring valuables to the hospital. [facility] is not responsible for any belongings/valuables. Do not wear nail polish, gel polish, artificial nails, or any other type of covering on natural nails (fingers and toes) Do not wear jewelry or makeup Put on clean/comfortable clothes.  Please brush your teeth.  Ask your nurse before applying any prescription medications to the skin.     CHG Compatible Lotions   Aveeno Moisturizing lotion  Cetaphil Moisturizing Cream  Cetaphil Moisturizing Lotion  Clairol Herbal Essence Moisturizing Lotion, Dry Skin  Clairol Herbal Essence Moisturizing Lotion, Extra Dry Skin  Clairol Herbal Essence Moisturizing Lotion, Normal Skin  Curel Age Defying Therapeutic Moisturizing Lotion with Alpha Hydroxy  Curel Extreme Care Body Lotion  Curel Soothing Hands Moisturizing Hand Lotion  Curel Therapeutic Moisturizing Cream, Fragrance-Free  Curel Therapeutic Moisturizing Lotion, Fragrance-Free  Curel Therapeutic Moisturizing Lotion, Original Formula  Eucerin Daily Replenishing Lotion  Eucerin Dry Skin Therapy Plus Alpha Hydroxy Crme  Eucerin Dry Skin Therapy Plus Alpha Hydroxy Lotion  Eucerin Original Crme  Eucerin Original Lotion  Eucerin Plus Crme Eucerin Plus Lotion  Eucerin TriLipid Replenishing Lotion  Keri Anti-Bacterial Hand Lotion  Keri Deep Conditioning Original Lotion Dry Skin Formula Softly  Scented  Keri Deep Conditioning Original Lotion, Fragrance Free Sensitive Skin Formula  Keri Lotion Fast Absorbing Fragrance Free Sensitive Skin Formula  Keri Lotion Fast Absorbing Softly Scented Dry Skin Formula  Keri Original Lotion  Keri Skin Renewal Lotion Keri Silky Smooth Lotion  Keri Silky Smooth Sensitive Skin Lotion  Nivea Body Creamy Conditioning Oil  Nivea Body Extra Enriched Lotion  Nivea Body Original Lotion  Nivea Body Sheer Moisturizing Lotion Nivea Crme  Nivea Skin Firming Lotion  NutraDerm 30 Skin Lotion  NutraDerm Skin Lotion  NutraDerm Therapeutic Skin Cream  NutraDerm Therapeutic Skin Lotion  ProShield Protective Hand Cream  Provon moisturizing lotion  Please read over the following fact sheets that you were given.

## 2022-12-22 ENCOUNTER — Inpatient Hospital Stay (HOSPITAL_COMMUNITY)
Admission: RE | Admit: 2022-12-22 | Discharge: 2022-12-22 | Disposition: A | Discharge status: Home / Self Care | Payer: Medicaid Other | Source: Ambulatory Visit

## 2022-12-24 ENCOUNTER — Encounter (HOSPITAL_COMMUNITY): Payer: Self-pay

## 2022-12-24 ENCOUNTER — Encounter (HOSPITAL_COMMUNITY)
Admission: RE | Admit: 2022-12-24 | Discharge: 2022-12-24 | Disposition: A | Discharge status: Home / Self Care | Payer: Medicaid Other | Source: Ambulatory Visit | Attending: Neurosurgery | Admitting: Neurosurgery

## 2022-12-24 ENCOUNTER — Other Ambulatory Visit: Payer: Self-pay

## 2022-12-24 VITALS — BP 135/91 | HR 82 | Temp 98.2°F | Resp 17 | Ht 69.0 in

## 2022-12-24 DIAGNOSIS — R0789 Other chest pain: Secondary | ICD-10-CM | POA: Diagnosis not present

## 2022-12-24 DIAGNOSIS — M4802 Spinal stenosis, cervical region: Secondary | ICD-10-CM | POA: Diagnosis not present

## 2022-12-24 DIAGNOSIS — F909 Attention-deficit hyperactivity disorder, unspecified type: Secondary | ICD-10-CM | POA: Diagnosis not present

## 2022-12-24 DIAGNOSIS — Z01812 Encounter for preprocedural laboratory examination: Secondary | ICD-10-CM | POA: Insufficient documentation

## 2022-12-24 DIAGNOSIS — Z9889 Other specified postprocedural states: Secondary | ICD-10-CM | POA: Diagnosis not present

## 2022-12-24 DIAGNOSIS — Z01818 Encounter for other preprocedural examination: Secondary | ICD-10-CM | POA: Diagnosis present

## 2022-12-24 HISTORY — DX: Attention-deficit hyperactivity disorder, unspecified type: F90.9

## 2022-12-24 LAB — CBC
HCT: 42.5 % (ref 39.0–52.0)
Hemoglobin: 13.8 g/dL (ref 13.0–17.0)
MCH: 28.2 pg (ref 26.0–34.0)
MCHC: 32.5 g/dL (ref 30.0–36.0)
MCV: 86.9 fL (ref 80.0–100.0)
Platelets: 188 10*3/uL (ref 150–400)
RBC: 4.89 MIL/uL (ref 4.22–5.81)
RDW: 15 % (ref 11.5–15.5)
WBC: 5 10*3/uL (ref 4.0–10.5)
nRBC: 0 % (ref 0.0–0.2)

## 2022-12-24 LAB — TYPE AND SCREEN
ABO/RH(D): A POS
Antibody Screen: NEGATIVE

## 2022-12-24 LAB — BASIC METABOLIC PANEL
Anion gap: 8 (ref 5–15)
BUN: 13 mg/dL (ref 6–20)
CO2: 28 mmol/L (ref 22–32)
Calcium: 9.2 mg/dL (ref 8.9–10.3)
Chloride: 102 mmol/L (ref 98–111)
Creatinine, Ser: 1 mg/dL (ref 0.61–1.24)
GFR, Estimated: >60 mL/min (ref >60)
Glucose, Bld: 105 mg/dL — ABNORMAL HIGH (ref 70–99)
Potassium: 4.6 mmol/L (ref 3.5–5.1)
Sodium: 138 mmol/L (ref 135–145)

## 2022-12-24 LAB — SURGICAL PCR SCREEN
MRSA, PCR: NEGATIVE
Staphylococcus aureus: NEGATIVE

## 2022-12-24 NOTE — Progress Notes (Addendum)
PCP - denies Cardiologist - denies  PPM/ICD - denies   Chest x-ray - 07/29/22 EKG - 07/21/22 Stress Test - 10/17/12 ECHO - 11/02/17 Cardiac Cath - denies  Sleep Study - denies   DM- denies  ASA/Blood Thinner Instructions: n/a   ERAS Protcol - no, NPO   COVID TEST- n/a   Anesthesia review: Yes.  Pt is flagged as difficult airway. Misty Stanley previously discussed this with Fayrene Fearing (w/ anesthesia). Pt just had surgery in April, no issues per pt except afterward. He was admitted on 4/17 with a "Dieulafoy lesion."  Patient denies shortness of breath, fever, cough and chest pain at PAT appointment   All instructions explained to the patient, with a verbal understanding of the material. Patient agrees to go over the instructions while at home for a better understanding.The opportunity to ask questions was provided.

## 2022-12-25 ENCOUNTER — Encounter (HOSPITAL_COMMUNITY): Payer: Self-pay

## 2022-12-25 NOTE — Anesthesia Preprocedure Evaluation (Addendum)
Anesthesia Evaluation  Patient identified by MRN, date of birth, ID band Patient awake    Reviewed: Allergy & Precautions, H&P , NPO status , Patient's Chart, lab work & pertinent test results  Airway Mallampati: III  TM Distance: >3 FB Neck ROM: Full    Dental no notable dental hx. (+) Teeth Intact, Dental Advisory Given   Pulmonary former smoker   Pulmonary exam normal breath sounds clear to auscultation       Cardiovascular negative cardio ROS  Rhythm:Regular Rate:Normal     Neuro/Psych negative neurological ROS  negative psych ROS   GI/Hepatic negative GI ROS, Neg liver ROS,,,  Endo/Other  negative endocrine ROS    Renal/GU negative Renal ROS  negative genitourinary   Musculoskeletal  (+) Arthritis , Osteoarthritis,    Abdominal   Peds  Hematology  (+) Blood dyscrasia, anemia   Anesthesia Other Findings   Reproductive/Obstetrics negative OB ROS                             Anesthesia Physical Anesthesia Plan  ASA: 2  Anesthesia Plan: General   Post-op Pain Management: Tylenol PO (pre-op)*   Induction: Intravenous  PONV Risk Score and Plan: 3 and Ondansetron, Dexamethasone and Midazolam  Airway Management Planned: Oral ETT and Video Laryngoscope Planned  Additional Equipment:   Intra-op Plan:   Post-operative Plan: Extubation in OR  Informed Consent: I have reviewed the patients History and Physical, chart, labs and discussed the procedure including the risks, benefits and alternatives for the proposed anesthesia with the patient or authorized representative who has indicated his/her understanding and acceptance.     Dental advisory given  Plan Discussed with: CRNA  Anesthesia Plan Comments: (PAT note written 12/25/2022 by Shonna Chock, PA-C.  Patient has a clinical FYI of "Difficult airway". A summary of his last 3 intubations at [facility] include: - On  07/29/22 for airway protection in setting of acute GI bleed: "Patient was intubated with endotracheal tube using Glidescope.  View was Grade 1 full glottis .  Number of attempts was 1." - On 07/27/22 for cervical laminotomy: IV induction.  Mask ventilation without difficulty.  Grade 1 view.  GlideScope and 4 used to place 7.0 mm ETT, one attempt. Difficult Airway - due to reduced neck mobility.  - On 11/06/13 for lumbar fusion: Mask ventilation without difficulty and oral airway inserted.  Grade 1 view.  Miller into used to place 7.5 mm ETT, one attempt. )       Anesthesia Quick Evaluation

## 2022-12-25 NOTE — Progress Notes (Addendum)
Anesthesia Chart Review:  Case: [id number] Date/Time: 09/16/Dillon 1005   Procedure: ACDF - C3-C4 - C4-C5 - C5-C6 with removal of anterior plate at A5-4   Anesthesia type: General   Pre-op diagnosis: Stenosis   [facility]: [facility] ROOM 20 / [facility]   Surgeons: Julio Sicks, MD       DISCUSSION: Patient is a 52 year old Macdonald scheduled for the above procedure.  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 Coronary calcium score of 0 with no evidence of CAD by 03/2016 CCTA.  Patient has a clinical FYI of "Difficult airway". A summary of his last 3 intubations at [facility] include: - On 4/17/Dillon for airway protection in setting of acute GI bleed: "Patient was intubated with endotracheal tube using Glidescope.  View was Grade 1 full glottis .  Number of attempts was 1." - On 4/15/Dillon for cervical laminotomy: IV induction.  Mask ventilation without difficulty.  Grade 1 view.  GlideScope and 4 used to place 7.0 mm ETT, one attempt. Difficult Airway - due to reduced neck mobility.  - On 11/06/13 for lumbar fusion: Mask ventilation without difficulty and oral airway inserted.  Grade 1 view.  Miller into used to place 7.5 mm ETT, one attempt.   - [facility] Admission 4/15/Dillon- 4/16/Dillon for left C7 & T1 laminotomy/foraminotomies 4/15/Dillon. Seemingly uncomplicated post-operative course and discharged home POD#1.  - [facility] admission for acute GI bleed 4/17/Dillon - 4/23/Dillon. On 4/17/Dillon, he woke up from a nap and felt acutely ill and diaphoretic. He had syncope then had multiple dark stools and vomited bright red blood. EMS described significant blood  loss. SBP 60-70's on arrival. HGB 8.8, down from 14.9 from his preoperative labs. He was treated with IVF, PRBC (total 2 units), FFP (total 2 units), IV PPI, transferred to ICU, evaluated by GI for endoscopy, and intubated for airway protection. He underwent a prolonged EGD to remove a large amount of clotted blood and food from the stomach. No active bleeding or source of bleeding identified. He had repeat EGD on 4/18/Dillon that showed a Dieulafoy lesion with no bleeding with stigmata of recent bleeding found on the anterior wall of the gastric body which was successfully injected with epinephrine solution and coagulation for hemostasis using monopolar gold probe. He was extubated on 4/18/Dillon.   Anesthesia team to evaluate on the day of surgery. H/H 13.8/42.5.    VS: BP (!) 135/91   Pulse 82   Temp 36.8 C   Resp 17   Ht 5\' 9"  (1.753 m)   SpO2 100%   BMI Dillon.55 kg/m    PROVIDERS: Pcp, No He saw cardiologist Chilton Si, MD in 2017-2018 for chest pain. CTA reassuring with calcium score of 0 and no evidence of CAD.   LABS: Labs reviewed: Acceptable for surgery. (all labs ordered are listed, but only abnormal results are displayed)  Labs Reviewed  BASIC METABOLIC PANEL - Abnormal; Notable for the following components:      Result Value   Glucose, Bld 105 (*)    All other components within normal limits  SURGICAL PCR SCREEN  CBC  TYPE AND SCREEN    IMAGES: MRI C-spine 7/11/Dillon (Canopy/PACS): IMPRESSION: 1. Postsurgical changes from left C7-T1  laminotomy with interval resolution of the left posterolateral disc extrusion. Residual posterior disc osteophyte complex with mild spinal canal stenosis and moderate bilateral neural foraminal narrowing at this level. 2. Stable moderate to severe spinal canal stenosis at C3-4 and C5-6 and moderate at C4-5. 3. Severe bilateral neural foraminal narrowing at C3-4. 4. Small fluid collection in the posterior soft tissues just deep to the  nuchal ligament/supraspinous ligament from the C6 to the T2 level, likely postsurgical seroma.   EKG: 4/9/Dillon: Sinus bradycardia (54 bpm)     CV: Echo 11/02/17:  - Left ventricle: The cavity size was normal. Systolic function was mildly reduced. The estimated ejection fraction was approximately 50%. Although no diagnostic regional wall motion abnormality was identified, this possibility cannot be completely excluded on the basis of this study.  - Aortic valve: There was no significant regurgitation.  - Mitral valve: There was no significant regurgitation.  - Atrial septum: No defect or patent foramen ovale was identified.  - Tricuspid valve: There was no significant regurgitation.  - Pulmonic valve: There was no significant regurgitation.  - Impressions: Technically difficult study. Echo contrast used to better evaluated LV wall motion. While large wall motion defects were not visualized, even with contrast the study is somewhat limited to assess focal wall motion abnormalities.    CT coronary morphology 03/26/16:  1. Coronary calcium score of 0. This was 0 percentile for age and sex matched control. 2. Normal coronary origin with right dominance. LAD and LCX have separate adjacent origins directly from the left coronary cusp. 3. No evidence of CAD. 4. There is mild dilatation of the pulmonary artery measuring 33 x Dillon mm.   Past Medical History:  Diagnosis Date   ADHD (attention deficit hyperactivity disorder)    Arthritis    Atypical chest pain 01/17/2016   Back pain    Herpes simplex    Hypogonadism Macdonald    Shortness of breath    pt denies (he said he experienced this in the past but it was r/t anxiety)   Thrombocytopenia (HCC) 2014   Tobacco abuse 05/18/2016    Past Surgical History:  Procedure Laterality Date   BACK SURGERY     lumbar fusion L3- 4, S 1   CERVICAL FUSION  2014   ESOPHAGOGASTRODUODENOSCOPY N/A 07/29/2022   Procedure: ESOPHAGOGASTRODUODENOSCOPY (EGD);   Surgeon: Sherrilyn Rist, MD;  Location: [facility] ENDOSCOPY;  Service: Gastroenterology;  Laterality: N/A;   ESOPHAGOGASTRODUODENOSCOPY N/A 07/30/2022   Procedure: ESOPHAGOGASTRODUODENOSCOPY (EGD);  Surgeon: Beverley Fiedler, MD;  Location: [facility] ENDOSCOPY;  Service: Gastroenterology;  Laterality: N/A;   FOREIGN BODY REMOVAL  07/29/2022   Procedure: FOREIGN BODY REMOVAL;  Surgeon: Sherrilyn Rist, MD;  Location: [facility] ENDOSCOPY;  Service: Gastroenterology;;   HOT HEMOSTASIS N/A 07/30/2022   Procedure: HOT HEMOSTASIS (ARGON PLASMA COAGULATION/BICAP);  Surgeon: Beverley Fiedler, MD;  Location: [facility] ENDOSCOPY;  Service: Gastroenterology;  Laterality: N/A;   POSTERIOR CERVICAL LAMINECTOMY Left 07/27/2022   Procedure: Microdiscectomy - left Cervical seven-Thoracic one;  Surgeon: Julio Sicks, MD;  Location: [facility] OR;  Service: Neurosurgery;  Laterality: Left;   SUBMUCOSAL INJECTION  07/30/2022   Procedure: SUBMUCOSAL INJECTION;  Surgeon: Beverley Fiedler, MD;  Location: [facility] ENDOSCOPY;  Service: Gastroenterology;;   VASECTOMY      MEDICATIONS:  aspirin EC 81 MG tablet   cyclobenzaprine (FLEXERIL) 10 MG tablet   ferrous sulfate 325 (65 FE) MG tablet   gabapentin (NEURONTIN) 100 MG capsule   HYDROcodone-acetaminophen (NORCO) 10-325 MG tablet  HYDROcodone-acetaminophen (NORCO/VICODIN) 5-325 MG tablet   pantoprazole (PROTONIX) 40 MG tablet   tamsulosin (FLOMAX) 0.4 MG CAPS capsule   No current facility-administered medications for this encounter.   Per medication list, he is not taking ASA, Neurontin, Norco 5/325 mg, Protonix.   Shonna Chock, PA-C Surgical Short Stay/Anesthesiology [facility] Phone [phone] [facility] Phone [phone] 12/25/2022 9:34 AM

## 2022-12-28 ENCOUNTER — Inpatient Hospital Stay (HOSPITAL_COMMUNITY): Payer: Medicaid Other

## 2022-12-28 ENCOUNTER — Inpatient Hospital Stay (HOSPITAL_COMMUNITY)
Admission: RE | Admit: 2022-12-28 | Discharge: 2022-12-29 | DRG: 472 | Disposition: A | Discharge status: Home / Self Care | Payer: Medicaid Other | Attending: Neurosurgery | Admitting: Neurosurgery

## 2022-12-28 ENCOUNTER — Other Ambulatory Visit: Payer: Self-pay

## 2022-12-28 ENCOUNTER — Inpatient Hospital Stay (HOSPITAL_COMMUNITY)
Admission: RE | Disposition: A | Discharge status: Home / Self Care | Payer: Self-pay | Source: Home / Self Care | Attending: Neurosurgery

## 2022-12-28 ENCOUNTER — Inpatient Hospital Stay (HOSPITAL_COMMUNITY): Payer: Medicaid Other | Admitting: Vascular Surgery

## 2022-12-28 ENCOUNTER — Inpatient Hospital Stay (HOSPITAL_COMMUNITY): Payer: Medicaid Other | Admitting: Anesthesiology

## 2022-12-28 DIAGNOSIS — M542 Cervicalgia: Secondary | ICD-10-CM | POA: Diagnosis present

## 2022-12-28 DIAGNOSIS — M4802 Spinal stenosis, cervical region: Secondary | ICD-10-CM

## 2022-12-28 DIAGNOSIS — F909 Attention-deficit hyperactivity disorder, unspecified type: Secondary | ICD-10-CM | POA: Diagnosis present

## 2022-12-28 DIAGNOSIS — Z823 Family history of stroke: Secondary | ICD-10-CM | POA: Diagnosis not present

## 2022-12-28 DIAGNOSIS — E739 Lactose intolerance, unspecified: Secondary | ICD-10-CM | POA: Diagnosis present

## 2022-12-28 DIAGNOSIS — Z8249 Family history of ischemic heart disease and other diseases of the circulatory system: Secondary | ICD-10-CM | POA: Diagnosis not present

## 2022-12-28 DIAGNOSIS — Z87891 Personal history of nicotine dependence: Secondary | ICD-10-CM | POA: Diagnosis not present

## 2022-12-28 DIAGNOSIS — Z7982 Long term (current) use of aspirin: Secondary | ICD-10-CM

## 2022-12-28 DIAGNOSIS — Z79899 Other long term (current) drug therapy: Secondary | ICD-10-CM | POA: Diagnosis not present

## 2022-12-28 DIAGNOSIS — M4712 Other spondylosis with myelopathy, cervical region: Principal | ICD-10-CM | POA: Diagnosis present

## 2022-12-28 DIAGNOSIS — M4722 Other spondylosis with radiculopathy, cervical region: Secondary | ICD-10-CM | POA: Diagnosis present

## 2022-12-28 HISTORY — PX: ANTERIOR CERVICAL DECOMP/DISCECTOMY FUSION: SHX1161

## 2022-12-28 SURGERY — ANTERIOR CERVICAL DECOMPRESSION/DISCECTOMY FUSION 3 LEVEL/HARDWARE REMOVAL
Anesthesia: General

## 2022-12-28 MED ORDER — ACETAMINOPHEN 500 MG PO TABS: 1000.0000 mg | ORAL_TABLET | Freq: Once | ORAL | Status: DC

## 2022-12-28 MED ORDER — HYDROMORPHONE HCL 1 MG/ML IJ SOLN: 1.0000 mg | INTRAMUSCULAR | Status: DC | PRN

## 2022-12-28 MED ORDER — DEXMEDETOMIDINE HCL IN NACL 80 MCG/20ML IV SOLN
INTRAVENOUS | Status: DC | PRN
  Administered 2022-12-28: 8 ug via INTRAVENOUS
  Administered 2022-12-28 (×3): 4 ug via INTRAVENOUS

## 2022-12-28 MED ORDER — ONDANSETRON HCL 4 MG PO TABS: 4.0000 mg | ORAL_TABLET | Freq: Four times a day (QID) | ORAL | Status: DC | PRN

## 2022-12-28 MED ORDER — THROMBIN 5000 UNITS EX SOLR
CUTANEOUS | Status: AC
  Filled 2022-12-28: qty 15000

## 2022-12-28 MED ORDER — PROPOFOL 10 MG/ML IV BOLUS
INTRAVENOUS | Status: AC
  Filled 2022-12-28: qty 20

## 2022-12-28 MED ORDER — PROPOFOL 10 MG/ML IV BOLUS
INTRAVENOUS | Status: DC | PRN
  Administered 2022-12-28: 140 mg via INTRAVENOUS
  Administered 2022-12-28: 50 ug/kg/min via INTRAVENOUS

## 2022-12-28 MED ORDER — FENTANYL CITRATE (PF) 250 MCG/5ML IJ SOLN
INTRAMUSCULAR | Status: DC | PRN
  Administered 2022-12-28: 50 ug via INTRAVENOUS
  Administered 2022-12-28 (×2): 25 ug via INTRAVENOUS
  Administered 2022-12-28: 50 ug via INTRAVENOUS
  Administered 2022-12-28: 100 ug via INTRAVENOUS

## 2022-12-28 MED ORDER — ACETAMINOPHEN 650 MG RE SUPP: 650.0000 mg | RECTAL | Status: DC | PRN

## 2022-12-28 MED ORDER — LACTATED RINGERS IV SOLN: INTRAVENOUS | Status: DC

## 2022-12-28 MED ORDER — DEXAMETHASONE SODIUM PHOSPHATE 10 MG/ML IJ SOLN
INTRAMUSCULAR | Status: DC | PRN
  Administered 2022-12-28: 10 mg via INTRAVENOUS

## 2022-12-28 MED ORDER — ONDANSETRON HCL 4 MG/2ML IJ SOLN
INTRAMUSCULAR | Status: DC | PRN
  Administered 2022-12-28: 4 mg via INTRAVENOUS

## 2022-12-28 MED ORDER — CHLORHEXIDINE GLUCONATE CLOTH 2 % EX PADS: 6.0000 | MEDICATED_PAD | Freq: Once | CUTANEOUS | Status: DC

## 2022-12-28 MED ORDER — SUGAMMADEX SODIUM 200 MG/2ML IV SOLN
INTRAVENOUS | Status: DC | PRN
  Administered 2022-12-28: 200 mg via INTRAVENOUS

## 2022-12-28 MED ORDER — TAMSULOSIN HCL 0.4 MG PO CAPS
0.4000 mg | ORAL_CAPSULE | Freq: Every day | ORAL | Status: DC
  Administered 2022-12-28 – 2022-12-29 (×2): 0.4 mg via ORAL
  Filled 2022-12-28 (×2): qty 1

## 2022-12-28 MED ORDER — SODIUM CHLORIDE 0.9% FLUSH: 3.0000 mL | INTRAVENOUS | Status: DC | PRN

## 2022-12-28 MED ORDER — HYDROMORPHONE HCL 1 MG/ML IJ SOLN
INTRAMUSCULAR | Status: AC
  Filled 2022-12-28: qty 2

## 2022-12-28 MED ORDER — FERROUS SULFATE 325 (65 FE) MG PO TABS
325.0000 mg | ORAL_TABLET | Freq: Every day | ORAL | Status: DC
  Administered 2022-12-29: 325 mg via ORAL
  Filled 2022-12-28: qty 1

## 2022-12-28 MED ORDER — HYDROCODONE-ACETAMINOPHEN 5-325 MG PO TABS: 1.0000 | ORAL_TABLET | ORAL | Status: DC | PRN

## 2022-12-28 MED ORDER — CYANOCOBALAMIN 500 MCG PO TABS
500.0000 ug | ORAL_TABLET | Freq: Every day | ORAL | Status: DC
  Administered 2022-12-29: 500 ug via ORAL
  Filled 2022-12-28 (×2): qty 1

## 2022-12-28 MED ORDER — ROCURONIUM BROMIDE 10 MG/ML (PF) SYRINGE
PREFILLED_SYRINGE | INTRAVENOUS | Status: DC | PRN
  Administered 2022-12-28: 20 mg via INTRAVENOUS
  Administered 2022-12-28: 60 mg via INTRAVENOUS
  Administered 2022-12-28: 10 mg via INTRAVENOUS

## 2022-12-28 MED ORDER — MIDAZOLAM HCL 2 MG/2ML IJ SOLN
INTRAMUSCULAR | Status: AC
  Filled 2022-12-28: qty 2

## 2022-12-28 MED ORDER — THROMBIN 5000 UNITS EX SOLR
OROMUCOSAL | Status: DC | PRN
  Administered 2022-12-28: 5 mL via TOPICAL

## 2022-12-28 MED ORDER — PANTOPRAZOLE SODIUM 40 MG PO TBEC
40.0000 mg | DELAYED_RELEASE_TABLET | Freq: Two times a day (BID) | ORAL | Status: DC
  Administered 2022-12-28 – 2022-12-29 (×2): 40 mg via ORAL
  Filled 2022-12-28 (×2): qty 1

## 2022-12-28 MED ORDER — CYCLOBENZAPRINE HCL 10 MG PO TABS
10.0000 mg | ORAL_TABLET | Freq: Three times a day (TID) | ORAL | Status: DC | PRN
  Administered 2022-12-28 – 2022-12-29 (×2): 10 mg via ORAL
  Filled 2022-12-28 (×2): qty 1

## 2022-12-28 MED ORDER — LIDOCAINE 2% (20 MG/ML) 5 ML SYRINGE
INTRAMUSCULAR | Status: DC | PRN
  Administered 2022-12-28: 60 mg via INTRAVENOUS

## 2022-12-28 MED ORDER — CEFAZOLIN SODIUM-DEXTROSE 2-4 GM/100ML-% IV SOLN
2.0000 g | INTRAVENOUS | Status: AC
  Administered 2022-12-28: 2 g via INTRAVENOUS
  Filled 2022-12-28: qty 100

## 2022-12-28 MED ORDER — ACETAMINOPHEN 325 MG PO TABS: 650.0000 mg | ORAL_TABLET | ORAL | Status: DC | PRN

## 2022-12-28 MED ORDER — HYDROMORPHONE HCL 1 MG/ML IJ SOLN
0.2500 mg | INTRAMUSCULAR | Status: DC | PRN
  Administered 2022-12-28 (×2): 0.5 mg via INTRAVENOUS

## 2022-12-28 MED ORDER — CHLORHEXIDINE GLUCONATE 0.12 % MT SOLN
15.0000 mL | Freq: Once | OROMUCOSAL | Status: AC
  Administered 2022-12-28: 15 mL via OROMUCOSAL
  Filled 2022-12-28: qty 15

## 2022-12-28 MED ORDER — THROMBIN (RECOMBINANT) 5000 UNITS EX SOLR
CUTANEOUS | Status: DC | PRN
  Administered 2022-12-28: 10 mL via TOPICAL

## 2022-12-28 MED ORDER — ONDANSETRON HCL 4 MG/2ML IJ SOLN: 4.0000 mg | Freq: Four times a day (QID) | INTRAMUSCULAR | Status: DC | PRN

## 2022-12-28 MED ORDER — SODIUM CHLORIDE 0.9 % IV SOLN
250.0000 mL | INTRAVENOUS | Status: DC
  Administered 2022-12-28: 250 mL via INTRAVENOUS

## 2022-12-28 MED ORDER — ACETAMINOPHEN 10 MG/ML IV SOLN
INTRAVENOUS | Status: AC
  Filled 2022-12-28: qty 100

## 2022-12-28 MED ORDER — HYDROCODONE-ACETAMINOPHEN 10-325 MG PO TABS
1.0000 | ORAL_TABLET | ORAL | Status: DC | PRN
  Administered 2022-12-28 – 2022-12-29 (×2): 1 via ORAL
  Administered 2022-12-29: 2 via ORAL
  Filled 2022-12-28 (×4): qty 1

## 2022-12-28 MED ORDER — MIDAZOLAM HCL 2 MG/2ML IJ SOLN
INTRAMUSCULAR | Status: DC | PRN
  Administered 2022-12-28: 2 mg via INTRAVENOUS

## 2022-12-28 MED ORDER — 0.9 % SODIUM CHLORIDE (POUR BTL) OPTIME
TOPICAL | Status: DC | PRN
  Administered 2022-12-28: 1000 mL

## 2022-12-28 MED ORDER — FENTANYL CITRATE (PF) 250 MCG/5ML IJ SOLN
INTRAMUSCULAR | Status: AC
  Filled 2022-12-28: qty 5

## 2022-12-28 MED ORDER — ACETAMINOPHEN 10 MG/ML IV SOLN
INTRAVENOUS | Status: DC | PRN
Start: 2022-12-28 — End: 2022-12-28
  Administered 2022-12-28: 1000 mg via INTRAVENOUS

## 2022-12-28 MED ORDER — CEFAZOLIN SODIUM-DEXTROSE 1-4 GM/50ML-% IV SOLN
1.0000 g | Freq: Three times a day (TID) | INTRAVENOUS | Status: AC
  Administered 2022-12-28 – 2022-12-29 (×2): 1 g via INTRAVENOUS
  Filled 2022-12-28 (×2): qty 50

## 2022-12-28 MED ORDER — ORAL CARE MOUTH RINSE: 15.0000 mL | Freq: Once | OROMUCOSAL | Status: AC

## 2022-12-28 MED ORDER — SODIUM CHLORIDE 0.9% FLUSH
3.0000 mL | Freq: Two times a day (BID) | INTRAVENOUS | Status: DC
  Administered 2022-12-28 (×2): 3 mL via INTRAVENOUS

## 2022-12-28 MED ORDER — PHENOL 1.4 % MT LIQD
1.0000 | OROMUCOSAL | Status: DC | PRN
  Administered 2022-12-28: 1 via OROMUCOSAL
  Filled 2022-12-28: qty 177

## 2022-12-28 MED ORDER — MENTHOL 3 MG MT LOZG
1.0000 | LOZENGE | OROMUCOSAL | Status: DC | PRN
  Administered 2022-12-28: 3 mg via ORAL
  Filled 2022-12-28 (×3): qty 9

## 2022-12-28 MED ORDER — PHENYLEPHRINE HCL-NACL 20-0.9 MG/250ML-% IV SOLN
INTRAVENOUS | Status: DC | PRN
  Administered 2022-12-28: 50 ug/min via INTRAVENOUS

## 2022-12-28 MED ORDER — HYDROCODONE-ACETAMINOPHEN 10-325 MG PO TABS
2.0000 | ORAL_TABLET | ORAL | Status: DC | PRN
  Administered 2022-12-28: 2 via ORAL
  Filled 2022-12-28: qty 2

## 2022-12-28 SURGICAL SUPPLY — 59 items
APL SKNCLS STERI-STRIP NONHPOA (GAUZE/BANDAGES/DRESSINGS) ×1
BAG COUNTER SPONGE SURGICOUNT (BAG) ×1 IMPLANT
BAG DECANTER FOR FLEXI CONT (MISCELLANEOUS) ×1 IMPLANT
BAG SPNG CNTER NS LX DISP (BAG) ×1
BENZOIN TINCTURE PRP APPL 2/3 (GAUZE/BANDAGES/DRESSINGS) ×1 IMPLANT
BIT DRILL 13 (BIT) IMPLANT
BUR MATCHSTICK NEURO 3.0 LAGG (BURR) ×1 IMPLANT
CAGE PEEK 6X14X11 (Cage) ×1 IMPLANT
CAGE PEEK 7X14X11 (Cage) ×2 IMPLANT
CANISTER SUCT 3000ML PPV (MISCELLANEOUS) ×1 IMPLANT
DRAPE C-ARM 42X72 X-RAY (DRAPES) ×2 IMPLANT
DRAPE LAPAROTOMY 100X72 PEDS (DRAPES) ×1 IMPLANT
DRAPE MICROSCOPE SLANT 54X150 (MISCELLANEOUS) ×1 IMPLANT
DURAPREP 6ML APPLICATOR 50/CS (WOUND CARE) ×1 IMPLANT
ELECT COATED BLADE 2.86 ST (ELECTRODE) ×1 IMPLANT
ELECT REM PT RETURN 9FT ADLT (ELECTROSURGICAL) ×1
ELECTRODE REM PT RTRN 9FT ADLT (ELECTROSURGICAL) ×1 IMPLANT
EVACUATOR 1/8 PVC DRAIN (DRAIN) IMPLANT
EVACUATOR 3/16 PVC DRAIN (DRAIN) IMPLANT
GAUZE 4X4 16PLY ~~LOC~~+RFID DBL (SPONGE) IMPLANT
GAUZE SPONGE 4X4 12PLY STRL (GAUZE/BANDAGES/DRESSINGS) ×1 IMPLANT
GLOVE BIO SURGEON STRL SZ 6.5 (GLOVE) IMPLANT
GLOVE ECLIPSE 9.0 STRL (GLOVE) ×1 IMPLANT
GLOVE EXAM NITRILE XL STR (GLOVE) IMPLANT
GOWN STRL REUS W/ TWL LRG LVL3 (GOWN DISPOSABLE) IMPLANT
GOWN STRL REUS W/ TWL XL LVL3 (GOWN DISPOSABLE) IMPLANT
GOWN STRL REUS W/TWL 2XL LVL3 (GOWN DISPOSABLE) IMPLANT
GOWN STRL REUS W/TWL LRG LVL3 (GOWN DISPOSABLE)
GOWN STRL REUS W/TWL XL LVL3 (GOWN DISPOSABLE)
HALTER HD/CHIN CERV TRACTION D (MISCELLANEOUS) ×1 IMPLANT
HEMOSTAT POWDER KIT SURGIFOAM (HEMOSTASIS) ×1 IMPLANT
KIT BASIN OR (CUSTOM PROCEDURE TRAY) ×1 IMPLANT
KIT TURNOVER KIT B (KITS) ×1 IMPLANT
NDL SPNL 20GX3.5 QUINCKE YW (NEEDLE) ×1 IMPLANT
NEEDLE SPNL 20GX3.5 QUINCKE YW (NEEDLE) ×1 IMPLANT
NS IRRIG 1000ML POUR BTL (IV SOLUTION) ×1 IMPLANT
PACK LAMINECTOMY NEURO (CUSTOM PROCEDURE TRAY) ×1 IMPLANT
PAD ARMBOARD 7.5X6 YLW CONV (MISCELLANEOUS) ×3 IMPLANT
PLATE 3 67.5XLCK NS SPNE CVD (Plate) IMPLANT
PLATE 3 ATLANTIS TRANS (Plate) ×1 IMPLANT
SCREW 4.0X13 (Screw) ×7 IMPLANT
SCREW BN 13X4XSLF DRL FXANG (Screw) IMPLANT
SCREW ST 13X4XST VA NS SPNE (Screw) IMPLANT
SCREW ST VAR 4 ATL (Screw) ×1 IMPLANT
SPACER SPNL 11X14X6XPEEK CVD (Cage) IMPLANT
SPACER SPNL 11X14X7XPEEK CVD (Cage) IMPLANT
SPCR SPNL 11X14X6XPEEK CVD (Cage) ×1 IMPLANT
SPCR SPNL 11X14X7XPEEK CVD (Cage) ×2 IMPLANT
SPONGE INTESTINAL PEANUT (DISPOSABLE) ×1 IMPLANT
SPONGE SURGIFOAM ABS GEL 100 (HEMOSTASIS) ×1 IMPLANT
STRIP CLOSURE SKIN 1/2X4 (GAUZE/BANDAGES/DRESSINGS) ×1 IMPLANT
SUT VIC AB 3-0 SH 8-18 (SUTURE) ×1 IMPLANT
SUT VIC AB 4-0 RB1 18 (SUTURE) ×1 IMPLANT
TAPE CLOTH 4X10 WHT NS (GAUZE/BANDAGES/DRESSINGS) ×1 IMPLANT
TAPE CLOTH SURG 4X10 WHT LF (GAUZE/BANDAGES/DRESSINGS) IMPLANT
TOWEL GREEN STERILE (TOWEL DISPOSABLE) ×1 IMPLANT
TOWEL GREEN STERILE FF (TOWEL DISPOSABLE) ×1 IMPLANT
TRAP SPECIMEN MUCUS 40CC (MISCELLANEOUS) ×1 IMPLANT
WATER STERILE IRR 1000ML POUR (IV SOLUTION) ×1 IMPLANT

## 2022-12-28 NOTE — Anesthesia Postprocedure Evaluation (Signed)
Anesthesia Post Note  Patient: FINEAS TWEET  Procedure(s) Performed: Anterior Cervical Decompression Fusion - Cervical Three-Cervical Four - Cervical Four-Cervical Five - Cervical Five-Cervical Six with removal of anterior plate at Cervical Six-Seven     Patient location during evaluation: PACU Anesthesia Type: General Level of consciousness: awake and alert Pain management: pain level controlled Vital Signs Assessment: post-procedure vital signs reviewed and stable Respiratory status: spontaneous breathing, nonlabored ventilation and respiratory function stable Cardiovascular status: blood pressure returned to baseline and stable Postop Assessment: no apparent nausea or vomiting Anesthetic complications: no  No notable events documented.  Last Vitals:  Vitals:   12/28/22 1400 12/28/22 1415  BP: 108/76 116/77  Pulse: 80 82  Resp: 17 11  Temp:  (!) 36.4 C  SpO2: 98% 98%    Last Pain:  Vitals:   12/28/22 1330  PainSc: 0-No pain                 Rosela Supak,W. EDMOND

## 2022-12-28 NOTE — Transfer of Care (Signed)
Immediate Anesthesia Transfer of Care Note  Patient: Dillon Macdonald  Procedure(s) Performed: Anterior Cervical Decompression Fusion - Cervical Three-Cervical Four - Cervical Four-Cervical Five - Cervical Five-Cervical Six with removal of anterior plate at Cervical Six-Seven  Patient Location: PACU  Anesthesia Type:General  Level of Consciousness: awake and drowsy  Airway & Oxygen Therapy: Patient Spontanous Breathing and Patient connected to face mask oxygen  Post-op Assessment: Report given to RN, Post -op Vital signs reviewed and stable, and Patient moving all extremities  Post vital signs: Reviewed and stable  Last Vitals:  Vitals Value Taken Time  BP 124/74 12/28/22 1330  Temp    Pulse 82 12/28/22 1333  Resp 13 12/28/22 1333  SpO2 98 % 12/28/22 1333  Vitals shown include unfiled device data.  Last Pain:  Vitals:   12/28/22 0808  PainSc: 4          Complications: No notable events documented.

## 2022-12-28 NOTE — H&P (Signed)
Dillon Macdonald is an 52 y.o. male.   Chief Complaint: Neck pain HPI: 52 year old male remotely status post C6-7 cervical discectomy and fusion and C7-T1 laminotomy foraminotomies and microdiscectomy.  He presents now with worsening neck and left upper extremity pain numbness and weakness failing all conservative management workup demonstrates evidence of progressive spondylosis and stenosis at C3-4, C4-5 and C5-6.  Patient has failed conservative management presents now for 3 level anterior cervical decompression and fusion in hopes of improving his symptoms.  Past Medical History:  Diagnosis Date   ADHD (attention deficit hyperactivity disorder)    Arthritis    Atypical chest pain 01/17/2016   Back pain    GI bleed 07/29/2022   due to gastric Dieulafoy lesion, s/p epi injection, cauterization 07/30/22   Herpes simplex    Hypogonadism male    Shortness of breath    pt denies (he said he experienced this in the past but it was r/t anxiety)   Thrombocytopenia (HCC) 2014   Tobacco abuse 05/18/2016    Past Surgical History:  Procedure Laterality Date   BACK SURGERY     lumbar fusion L3- 4, S 1   CERVICAL FUSION  2014   ESOPHAGOGASTRODUODENOSCOPY N/A 07/29/2022   Procedure: ESOPHAGOGASTRODUODENOSCOPY (EGD);  Surgeon: Sherrilyn Rist, MD;  Location: [facility] ENDOSCOPY;  Service: Gastroenterology;  Laterality: N/A;   ESOPHAGOGASTRODUODENOSCOPY N/A 07/30/2022   Procedure: ESOPHAGOGASTRODUODENOSCOPY (EGD);  Surgeon: Beverley Fiedler, MD;  Location: [facility] ENDOSCOPY;  Service: Gastroenterology;  Laterality: N/A;   FOREIGN BODY REMOVAL  07/29/2022   Procedure: FOREIGN BODY REMOVAL;  Surgeon: Sherrilyn Rist, MD;  Location: [facility] ENDOSCOPY;  Service: Gastroenterology;;   HOT HEMOSTASIS N/A 07/30/2022   Procedure: HOT HEMOSTASIS (ARGON PLASMA COAGULATION/BICAP);  Surgeon: Beverley Fiedler, MD;  Location: [facility] ENDOSCOPY;  Service: Gastroenterology;  Laterality: N/A;   POSTERIOR CERVICAL LAMINECTOMY Left 07/27/2022    Procedure: Microdiscectomy - left Cervical seven-Thoracic one;  Surgeon: Julio Sicks, MD;  Location: [facility] OR;  Service: Neurosurgery;  Laterality: Left;   SUBMUCOSAL INJECTION  07/30/2022   Procedure: SUBMUCOSAL INJECTION;  Surgeon: Beverley Fiedler, MD;  Location: [facility] ENDOSCOPY;  Service: Gastroenterology;;   VASECTOMY      Family History  Problem Relation Age of Onset   Hyperthyroidism Mother    Stroke Father    Heart attack Brother    Heart attack Paternal Uncle    Heart attack Brother    Hypertension Brother    Other Neg Hx        low testosterone   Social History:  reports that he quit smoking about 15 months ago. His smoking use included cigarettes. He started smoking about 21 years ago. He has a 40 pack-year smoking history. He has never used smokeless tobacco. He reports that he does not drink alcohol and does not use drugs.  Allergies:  Allergies  Allergen Reactions   Lactose Intolerance (Gi) Other (See Comments)   Sildenafil Other (See Comments)    Blurry vision    Medications Prior to Admission  Medication Sig Dispense Refill   cyanocobalamin (VITAMIN B12) 500 MCG tablet Take 500 mcg by mouth daily.     cyclobenzaprine (FLEXERIL) 10 MG tablet Take 1 tablet (10 mg total) by mouth 3 (three) times daily as needed for muscle spasms. (Patient taking differently: Take 10 mg by mouth at bedtime.) 30 tablet 0   ferrous sulfate 325 (65 FE) MG tablet Take 1 tablet (325 mg total) by mouth daily with breakfast. 60 tablet  1   HYDROcodone-acetaminophen (NORCO) 10-325 MG tablet Take 0.5-1 tablets by mouth every 6 (six) hours as needed for moderate pain.     pantoprazole (PROTONIX) 40 MG tablet Take 1 tablet (40 mg) by mouth 2 times daily. 60 tablet 3   tamsulosin (FLOMAX) 0.4 MG CAPS capsule Take 1 capsule (0.4 mg) by mouth daily. 30 capsule 2   aspirin EC 81 MG tablet Take 1 tablet (81 mg total) by mouth daily. (Patient not taking: Reported on 12/15/2022) 90 tablet 3   gabapentin (NEURONTIN)  100 MG capsule Take 1 capsule (100 mg total) by mouth 3 (three) times daily. (Patient not taking: Reported on 12/15/2022) 90 capsule 2   HYDROcodone-acetaminophen (NORCO/VICODIN) 5-325 MG tablet Take 1-2 tablets by mouth every 4 (four) hours as needed for moderate pain ((score 4 to 6)). (Patient not taking: Reported on 12/15/2022) 40 tablet 0    No results found for this or any previous visit (from the past 48 hour(s)). No results found.  Pertinent items noted in HPI and remainder of comprehensive ROS otherwise negative.  Blood pressure (!) 133/90, pulse 75, temperature 98.2 F (36.8 C), resp. rate 17, height 5\' 9"  (1.753 m), weight 72.6 kg, SpO2 99%.  Patient is awake and alert.  He is oriented and appropriate.  Speech is fluent.  Judgment and insight are intact.  Cranial nerve function normal bilaterally motor examination reveals mild weakness in his intrinsic function in his left hand otherwise motor strength intact.  Sensory examination some patchy distal sensory loss more prominent in his left C8 dermatome in the left upper extremity..  Reflexes are normal active.  No muscle long track signs.  Gait is reasonably normal.  Examination head ears eyes and throat is unremarked.  Chest and abdomen benign.  Extremities are free of major deformity. 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  Plan C3-4, C4-5, C5-6 anterior cervical discectomy and fusion with interbody cages, with harvested autograft, and anterior plate instrumentation.  Risks and benefits been explained.  Patient wishes proceed.  Sherilyn Cooter A Sulamita Lafountain 12/28/2022, 10:04 AM

## 2022-12-28 NOTE — Anesthesia Procedure Notes (Signed)
Procedure Name: Intubation Date/Time: 12/28/2022 10:55 AM  Performed by: Loleta Yusif Gnau, CRNAPre-anesthesia Checklist: Patient identified, Emergency Drugs available, Suction available and Patient being monitored Patient Re-evaluated:Patient Re-evaluated prior to induction Oxygen Delivery Method: Circle system utilized Preoxygenation: Pre-oxygenation with 100% oxygen Induction Type: IV induction Ventilation: Mask ventilation without difficulty Laryngoscope Size: Glidescope and 4 Grade View: Grade I Tube type: Oral Tube size: 7.5 mm Number of attempts: 1 Airway Equipment and Method: Stylet and Oral airway Placement Confirmation: ETT inserted through vocal cords under direct vision, positive ETCO2 and breath sounds checked- equal and bilateral Secured at: 23 cm Tube secured with: Tape Dental Injury: Teeth and Oropharynx as per pre-operative assessment

## 2022-12-28 NOTE — Brief Op Note (Signed)
12/28/2022  1:08 PM  PATIENT:  Dillon Macdonald  52 y.o. male  PRE-OPERATIVE DIAGNOSIS:  Stenosis  POST-OPERATIVE DIAGNOSIS:  Stenosis  PROCEDURE:  Procedure(s): Anterior Cervical Decompression Fusion - Cervical Three-Cervical Four - Cervical Four-Cervical Five - Cervical Five-Cervical Six with removal of anterior plate at Cervical Six-Seven (N/A)  SURGEON:  Surgeons and Role:    Julio Sicks, MD - Primary  PHYSICIAN ASSISTANT:   ASSISTANTSMarland Mcalpine   ANESTHESIA:   general  EBL:  350 mL   BLOOD ADMINISTERED:none  DRAINS: (med) Hemovact drain(s) in the pre-vert space with  Suction Open   LOCAL MEDICATIONS USED:  NONE  SPECIMEN:  No Specimen  DISPOSITION OF SPECIMEN:  N/A  COUNTS:  YES  TOURNIQUET:  * No tourniquets in log *  DICTATION: .Dragon Dictation  PLAN OF CARE: Admit to inpatient   PATIENT DISPOSITION:  PACU - hemodynamically stable.   Delay start of Pharmacological VTE agent (>24hrs) due to surgical blood loss or risk of bleeding: yes

## 2022-12-28 NOTE — Plan of Care (Signed)
°  Problem: Education: °Goal: Ability to verbalize activity precautions or restrictions will improve °Outcome: Completed/Met °Goal: Knowledge of the prescribed therapeutic regimen will improve °Outcome: Completed/Met °Goal: Understanding of discharge needs will improve °Outcome: Completed/Met °  °Problem: Activity: °Goal: Ability to avoid complications of mobility impairment will improve °Outcome: Completed/Met °Goal: Ability to tolerate increased activity will improve °Outcome: Completed/Met °Goal: Will remain free from falls °Outcome: Completed/Met °  °Problem: Bowel/Gastric: °Goal: Gastrointestinal status for postoperative course will improve °Outcome: Completed/Met °  °Problem: Clinical Measurements: °Goal: Ability to maintain clinical measurements within normal limits will improve °Outcome: Completed/Met °Goal: Postoperative complications will be avoided or minimized °Outcome: Completed/Met °Goal: Diagnostic test results will improve °Outcome: Completed/Met °  °Problem: Pain Management: °Goal: Pain level will decrease °Outcome: Completed/Met °  °Problem: Skin Integrity: °Goal: Will show signs of wound healing °Outcome: Completed/Met °  °Problem: Health Behavior/Discharge Planning: °Goal: Identification of resources available to assist in meeting health care needs will improve °Outcome: Completed/Met °  °Problem: Bladder/Genitourinary: °Goal: Urinary functional status for postoperative course will improve °Outcome: Completed/Met °  °

## 2022-12-28 NOTE — Op Note (Signed)
Date of procedure: 12/28/2022  Date of dictation: Same  Service: Neurosurgery  Preoperative diagnosis: Cervical spondylosis with myelopathy and radicular  Postoperative diagnosis: Same  Procedure Name: C3-4, C4-5, C5-6 anterior cervical discectomy with interbody fusion utilizing interbody cage, local harvested autograft, and anterior plate instrumentation  Removal of C6-7 anterior cervical plate  Surgeon:Erica Osuna A.Yostin Malacara, M.D.  Asst. Surgeon: Doran Durand, NP  Anesthesia: General  Indication: 52 year old male with chronic neck and upper extremity pain failing conservative management.  Patient remotely status post C6-7 anterior cervical discectomy and fusion.  Workup demonstrates evidence of significant multilevel spondylosis with stenosis and ongoing cord and nerve root compression.  Patient presents now for 3 level anterior cervical decompression and fusion improving his symptoms.  Operative note: After induction of anesthesia, patient positioned supine with his neck extended Extended and held in place with Holter traction .  Patient's anterior cervical region prepped draped sterilely.  Incision made overlying C5.  Dissection performed on the right.  Retractor placed.  Fluoroscopy used.  Levels identified.  Previously performed anterior cervical discectomy and fusion with anterior instrumentation was dissected free at C6-7.  Fusion was inspected and found to be solid.  Plate was disassembled and removed as it was blocking instrumentation into the C6 level.  Disc bases at C3-4, C4-5 and C5-6 were incised.  Discectomy performed using various instruments down to level of the posterior annulus at all 3 levels.  Microscope then brought into field used throughout the remainder of the discectomy.  Starting first at C3-4 remaining aspects of annulus and osteophytes were removed using high-speed drill down to the level of the posterior logical ligament.  Posterior approach was elevated and resected in a  piecemeal fashion.  Underlying thecal sac was identified.  A wide central decompression then performed undercutting the bodies of C3 and C4.  Decompression then proceeded to each neural foramina.  Wide anterior foraminotomies was exiting C4 nerve roots bilaterally.  At this point a very thorough decompression had been achieved.  There was no evidence of injury to the thecal sac or nerve roots.  Gelfoam was placed topically then removed.  Procedure was then repeated at C4-5 and C5-6 again without complications.  [employer]onic anatomic peek cages were then packed with locally harvested autograft.  Cages were then impacted into place at all 3 levels where there were recessed slightly from the anterior cortical margins.  Medtronic Atlantis translational anterior cervical plate was then placed over the C3, C4, C5 and C6 level.  Platelet antalgic fluoroscopic guidance in 13 mm fixed angle screws to each at all 4 levels.  All screws, final tightening found to be solidly within the bone.  Locking screws were engaged at all levels.  Final images reveal good position of the cages and the hardware at the proper level with normal alignment of the spine.  Wound was then irrigated.  A medium contact drain was left in the prevertebral space.  Wound was then closed in layers with Vicryl sutures.  Steri-Strips and sterile dressing were applied.  No apparent complications.  Patient tolerated the procedure well and he returns to the recovery room postop.

## 2022-12-29 MED ORDER — HYDROCODONE-ACETAMINOPHEN 10-325 MG PO TABS: 0.5000 | ORAL_TABLET | Freq: Four times a day (QID) | ORAL | 0 refills | Status: AC | PRN

## 2022-12-29 MED FILL — Thrombin For Soln 5000 Unit: CUTANEOUS | Qty: 2 | Status: AC

## 2022-12-29 NOTE — Progress Notes (Signed)
Patient alert and oriented, mae's well, voiding adequate amount of urine, swallowing without difficulty, no c/o pain at time of discharge. Patient discharged home with family. Script and discharged instructions given to patient. Patient stated understanding of instructions given. Patient has an appointment with Dr. Jordan Likes in 2 weeks

## 2022-12-29 NOTE — Discharge Summary (Signed)
Physician Discharge Summary  Patient ID: HANDY FLAVIN MRN: [id number] DOB/AGE: Sep 16, 1970 52 y.o.  Admit date: 12/28/2022 Discharge date: 12/29/2022  Admission Diagnoses:  Discharge Diagnoses:  Principal Problem:   Cervical spondylosis with myelopathy and radiculopathy   Discharged Condition: good  Hospital Course: Patient mated to the hospital where he underwent uncomplicated three-level anterior cervical decompression and fusion.  Postoperatively doing well.  Preoperative numbness and pain into his left upper extremity resolved.  Standing ambulating and voiding without difficulty.  Swallowing well.  Ready for discharge home.  Consults:   Significant Diagnostic Studies:   Treatments:   Discharge Exam: Blood pressure 114/77, pulse 88, temperature 98.3 F (36.8 C), temperature source Oral, resp. rate 16, height 5\' 9"  (1.753 m), weight 72.6 kg, SpO2 99%. Awake and alert.  Oriented and appropriate.  Motor and sensory function intact.  Wound clean and dry.  Chest and abdomen benign.  Disposition: Discharge disposition: 01-Home or Self Care        Allergies as of 12/29/2022       Reactions   Lactose Intolerance (gi) Other (See Comments)   Sildenafil Other (See Comments)   Blurry vision        Medication List     TAKE these medications    aspirin EC 81 MG tablet Take 1 tablet (81 mg total) by mouth daily.   cyanocobalamin 500 MCG tablet Commonly known as: VITAMIN B12 Take 500 mcg by mouth daily.   cyclobenzaprine 10 MG tablet Commonly known as: FLEXERIL Take 1 tablet (10 mg total) by mouth 3 (three) times daily as needed for muscle spasms. What changed: when to take this   FeroSul 325 (65 Fe) MG tablet Generic drug: ferrous sulfate Take 1 tablet (325 mg total) by mouth daily with breakfast.   gabapentin 100 MG capsule Commonly known as: Neurontin Take 1 capsule (100 mg total) by mouth 3 (three) times daily.   HYDROcodone-acetaminophen 5-325 MG  tablet Commonly known as: NORCO/VICODIN Take 1-2 tablets by mouth every 4 (four) hours as needed for moderate pain ((score 4 to 6)).   HYDROcodone-acetaminophen 10-325 MG tablet Commonly known as: NORCO Take 0.5-1 tablets by mouth every 6 (six) hours as needed for moderate pain.   pantoprazole 40 MG tablet Commonly known as: PROTONIX Take 1 tablet (40 mg) by mouth 2 times daily.   tamsulosin 0.4 MG Caps capsule Commonly known as: FLOMAX Take 1 capsule (0.4 mg) by mouth daily.        Follow-up Information     Julio Sicks, MD. Call.   Specialty: Neurosurgery Why: As needed, If symptoms worsen Contact information: [address] [place] [address] [phone]                 Signed: Temple Pacini 12/29/2022, 10:05 AM

## 2022-12-29 NOTE — Evaluation (Signed)
Occupational Therapy Evaluation Patient Details Name: Dillon Macdonald MRN: [id number] DOB: May 28, 1970 Today's Date: 12/29/2022   History of Present Illness Dillon Macdonald is a 52 yo male who underwent  Anterior Cervical Decompression Fusion - Cervical Three-Cervical Four - Cervical Four-Cervical Five - Cervical Five-Cervical Six with removal of anterior plate at Cervical Six-Seven 9/16. PMHx: ADHD, arthritis, GI bleed, Herpes simplex, 3x spinal surgeries   Clinical Impression   Dillon Macdonald was evaluated s/p the above spine surgery. He is indep and active at baseline. Upon evaluation pt was limited by surgical pain, throat soreness, precautions and activity tolerance. Overall he needed supervision A for mobility and ADLs with minimal cues. No AD needed. Provided cues and education on spinal precautions and compensatory techniques throughout, handout provided and pt demonstrated good recall during ADLs and mobility. Pt does not require further acute OT services. Recommend d/c home with support of family.         If plan is discharge home, recommend the following: Assist for transportation;Assistance with cooking/housework    Functional Status Assessment  Patient has had a recent decline in their functional status and demonstrates the ability to make significant improvements in function in a reasonable and predictable amount of time.  Equipment Recommendations  None recommended by OT       Precautions / Restrictions Precautions Precautions: Fall;Cervical Precaution Booklet Issued: Yes (comment) Required Braces or Orthoses: Cervical Brace Cervical Brace: Soft collar;At all times Restrictions Weight Bearing Restrictions: No      Mobility Bed Mobility Overal bed mobility: Needs Assistance Bed Mobility: Rolling, Sidelying to Sit Rolling: Supervision Sidelying to sit: Supervision            Transfers Overall transfer level: Needs assistance Equipment used: None Transfers: Sit to/from  Stand Sit to Stand: Supervision                  Balance Overall balance assessment: Needs assistance Sitting-balance support: Feet supported Sitting balance-Leahy Scale: Good     Standing balance support: No upper extremity supported, During functional activity Standing balance-Leahy Scale: Fair                             ADL either performed or assessed with clinical judgement   ADL Overall ADL's : Needs assistance/impaired Eating/Feeding: Independent;Sitting   Grooming: Supervision/safety;Standing   Upper Body Bathing: Modified independent   Lower Body Bathing: Supervison/ safety;Sit to/from stand   Upper Body Dressing : Modified independent;Sitting   Lower Body Dressing: Supervision/safety   Toilet Transfer: Supervision/safety   Toileting- Clothing Manipulation and Hygiene: Modified independent       Functional mobility during ADLs: Supervision/safety General ADL Comments: supervision for safety and cues only, minimal cues for cervical precautions throughout     Vision Baseline Vision/History: 0 No visual deficits Vision Assessment?: No apparent visual deficits     Perception Perception: Not tested       Praxis Praxis: Not tested       Pertinent Vitals/Pain Pain Assessment Pain Assessment: Faces Faces Pain Scale: Hurts even more Pain Location: throat Pain Descriptors / Indicators: Discomfort Pain Intervention(s): Limited activity within patient's tolerance, Monitored during session     Extremity/Trunk Assessment Upper Extremity Assessment Upper Extremity Assessment: Overall WFL for tasks assessed (pt reports L hand numbness has resolved)   Lower Extremity Assessment Lower Extremity Assessment: Defer to PT evaluation   Cervical / Trunk Assessment Cervical / Trunk Assessment: Neck Surgery   Communication Communication  Communication: No apparent difficulties   Cognition Arousal: Alert Behavior During Therapy: WFL for tasks  assessed/performed Overall Cognitive Status: Within Functional Limits for tasks assessed                                 General Comments: needed minimal cues to maintain cervical precautions. pt had his soft collar off upon arrival, per pt his neck is too swollen and painful to don collar.     General Comments  VSS on RA            Home Living Family/patient expects to be discharged to:: Private residence Living Arrangements: Alone Available Help at Discharge: Family;Neighbor;Available PRN/intermittently Type of Home: House Home Access: Stairs to enter [employer] of Steps: 3 Entrance Stairs-Rails: Can reach both Home Layout: One level     Bathroom Shower/Tub: Chief Strategy Officer: Standard Bathroom Accessibility: Yes   Home Equipment: Agricultural consultant (2 wheels)   Additional Comments: palsn to stay with daughter if needed      Prior Functioning/Environment Prior Level of Function : Independent/Modified Independent;Driving;Working/employed             Mobility Comments: indep ADLs Comments: indep, works, active        OT Problem List: Decreased activity tolerance;Pain         OT Goals(Current goals can be found in the care plan section) Acute Rehab OT Goals Patient Stated Goal: home OT Goal Formulation: With patient Time For Goal Achievement: 12/29/22 Potential to Achieve Goals: Good   AM-PAC OT "6 Clicks" Daily Activity     Outcome Measure Help from another person eating meals?: None Help from another person taking care of personal grooming?: A Little Help from another person toileting, which includes using toliet, bedpan, or urinal?: A Little Help from another person bathing (including washing, rinsing, drying)?: A Little Help from another person to put on and taking off regular upper body clothing?: None Help from another person to put on and taking off regular lower body clothing?: A Little 6 Click Score: 20    End of Session Equipment Utilized During Treatment: Cervical collar Nurse Communication: Mobility status  Activity Tolerance: Patient tolerated treatment well Patient left: with call bell/phone within reach;in bed  OT Visit Diagnosis: Unsteadiness on feet (R26.81);Pain                Time: 1610-9604 OT Time Calculation (min): 14 min Charges:  OT General Charges $OT Visit: 1 Visit OT Evaluation $OT Eval Low Complexity: 1 Low  Derenda Mis, OTR/L Acute Rehabilitation Services Office [phone] Secure Chat Communication Preferred   Donia Pounds 12/29/2022, 10:36 AM

## 2022-12-29 NOTE — Discharge Instructions (Addendum)
Wound Care Keep incision covered and dry for three days.  Do not put any creams, lotions, or ointments on incision. Leave steri-strips on neck.  They will fall off by themselves. Activity Walk each and every day, increasing distance each day. No lifting greater than 8 lbs.  Avoid excessive neck motion. No driving for 2 weeks; may ride as a passenger locally.  Diet Resume your normal diet.   Call Your Doctor If Any of These Occur Redness, drainage, or swelling at the wound.  Temperature greater than 101 degrees. Severe pain not relieved by pain medication. Incision starts to come apart. Follow Up Appt Call [phone])  for problems.  If you have any hardware placed in your spine, you will need an x-ray before your appointment.

## 2022-12-31 ENCOUNTER — Encounter (HOSPITAL_COMMUNITY): Payer: Self-pay | Admitting: Neurosurgery

## 2023-01-12 ENCOUNTER — Encounter (HOSPITAL_COMMUNITY): Payer: Self-pay | Admitting: Neurosurgery

## 2023-01-12 NOTE — Addendum Note (Signed)
Addendum  created 01/12/23 0831 by Gaynelle Adu, MD   Intraprocedure Event edited, Intraprocedure Staff edited

## 2023-08-05 NOTE — Therapy (Signed)
 OUTPATIENT PHYSICAL THERAPY SHOULDER EVALUATION   Patient Name: Dillon Macdonald MRN: [id number] DOB:10-27-1970, 53 y.o., male Today's Date: 08/07/2023   PT End of Session - 08/06/23 1146     Visit Number 1    Number of Visits --   1-2x/week   Date for PT Re-Evaluation 10/02/23    Authorization Type [employer]    PT Start Time 1145    PT Stop Time 1220    PT Time Calculation (min) 35 min             Past Medical History:  Diagnosis Date   ADHD (attention deficit hyperactivity disorder)    Arthritis    Atypical chest pain 01/17/2016   Back pain    GI bleed 07/29/2022   due to gastric Dieulafoy lesion, s/p epi injection, cauterization 07/30/22   Herpes simplex    Hypogonadism male    Shortness of breath    pt denies (he said he experienced this in the past but it was r/t anxiety)   Thrombocytopenia (HCC) 2014   Tobacco abuse 05/18/2016   Past Surgical History:  Procedure Laterality Date   ANTERIOR CERVICAL DECOMP/DISCECTOMY FUSION N/A 12/28/2022   Procedure: Anterior Cervical Decompression Fusion - Cervical Three-Cervical Four - Cervical Four-Cervical Five - Cervical Five-Cervical Six with removal of anterior plate at Cervical Six-Seven;  Surgeon: Agustina Aldrich, MD;  Location: [facility] OR;  Service: Neurosurgery;  Laterality: N/A;   BACK SURGERY     lumbar fusion L3- 4, S 1   CERVICAL FUSION  2014   ESOPHAGOGASTRODUODENOSCOPY N/A 07/29/2022   Procedure: ESOPHAGOGASTRODUODENOSCOPY (EGD);  Surgeon: Albertina Hugger, MD;  Location: [facility] ENDOSCOPY;  Service: Gastroenterology;  Laterality: N/A;   ESOPHAGOGASTRODUODENOSCOPY N/A 07/30/2022   Procedure: ESOPHAGOGASTRODUODENOSCOPY (EGD);  Surgeon: Nannette Babe, MD;  Location: [facility] ENDOSCOPY;  Service: Gastroenterology;  Laterality: N/A;   FOREIGN BODY REMOVAL  07/29/2022   Procedure: FOREIGN BODY REMOVAL;  Surgeon: Albertina Hugger, MD;  Location: [facility] ENDOSCOPY;  Service: Gastroenterology;;   HOT HEMOSTASIS N/A 07/30/2022   Procedure: HOT HEMOSTASIS  (ARGON PLASMA COAGULATION/BICAP);  Surgeon: Nannette Babe, MD;  Location: [facility] ENDOSCOPY;  Service: Gastroenterology;  Laterality: N/A;   POSTERIOR CERVICAL LAMINECTOMY Left 07/27/2022   Procedure: Microdiscectomy - left Cervical seven-Thoracic one;  Surgeon: Agustina Aldrich, MD;  Location: [facility] OR;  Service: Neurosurgery;  Laterality: Left;   SUBMUCOSAL INJECTION  07/30/2022   Procedure: SUBMUCOSAL INJECTION;  Surgeon: Nannette Babe, MD;  Location: [facility] ENDOSCOPY;  Service: Gastroenterology;;   VASECTOMY     Patient Active Problem List   Diagnosis Date Noted   Cervical spondylosis with myelopathy and radiculopathy 12/28/2022   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 07/30/2022   Upper GI bleed 07/29/2022   Hematemesis with nausea 07/29/2022   Blood loss anemia 07/29/2022   Melena 07/29/2022   Cervical radiculopathy 07/27/2022   Tobacco abuse 05/18/2016   Atypical chest pain 01/17/2016   Hypogonadism male    Spinal stenosis, lumbar region, with neurogenic claudication 11/06/2013   Lumbar stenosis with neurogenic claudication 11/06/2013   Thrombocytopenia, unspecified (HCC) 12/21/2012    PCP: Pcp, No  REFERRING PROVIDER: Johnita Nails, NP  THERAPY DIAG:  Cervicalgia - Plan: PT plan of care cert/re-cert  Other low back pain - Plan: PT plan of care cert/re-cert  Muscle weakness - Plan: PT plan of care cert/re-cert  REFERRING DIAG: Radiculopathy, cervical region [M54.12]   Rationale for Evaluation and Treatment:  Rehabilitation  SUBJECTIVE:  PERTINENT PAST HISTORY:  ACDF C3-C6 9/24, Multiple lumbar surgeries including fusion from T1 - S1       PRECAUTIONS: see PMH  WEIGHT BEARING RESTRICTIONS No  FALLS:  Has patient fallen in last 6 months? No, Number of falls: 0  MOI/History of condition:  Onset date: 9/24  SUBJECTIVE STATEMENT  Dillon Macdonald is a 53 y.o. male who presents to clinic with chief complaint of neck pain and tightness following ACDF C3-C6 in  9/24.  He states he has had multiple lumbar surgeries as well following vertebral fracture many years ago.  His neck and UT feel locked up and tight.  He had n/t in his L fingers 4 and 5.  He feels like L trap has a knot like a tennis ball.  Pain:  Are you having pain? Yes Pain location: neck bil UT NPRS scale:  4/10 to 8/10 Aggravating factors: neck movements Relieving factors: rest Pain description: constant Stage: Chronic  Occupation: unable  Assistive Device: scooter, FWW  Hand Dominance: R  Patient Goals/Specific Activities: reduce pain   OBJECTIVE:   DIAGNOSTIC FINDINGS:  None recent  GENERAL OBSERVATION: Rounded shoulders - fwd head     SENSATION: Light touch: Deficits L digits 4-5   PALPATION: Significant TTP throughout cervical and upper thoracic musculature including bil UT  Cervical ROM  ROM ROM  (Eval)  Flexion 10*  Extension 10*  Right lateral flexion 15*  Left lateral flexion 15*  Right rotation 20*  Left rotation 20*  Flexion rotation (normal is 30 degrees)   Flexion rotation (normal is 30 degrees)     (Blank rows = not tested, N = WNL, * = concordant pain)  UPPER EXTREMITY AROM:  ROM Right (Eval) Left (Eval)  Shoulder flexion 90* 60*  Shoulder abduction    Shoulder internal rotation    Shoulder external rotation    Functional IR    Functional ER    Shoulder extension    Elbow extension    Elbow flexion     (Blank rows = not tested, N = WNL, * = concordant pain with testing)     UPPER EXTREMITY MMT:  MMT Right (Eval) Left (Eval)  Shoulder flexion 3+* 2+*  Shoulder abduction (C5)    Shoulder ER    Shoulder IR    Middle trapezius    Lower trapezius    Shoulder extension    Grip strength    Shoulder shrug (C4)    Elbow flexion (C6)    Elbow ext (C7)    Thumb ext (C8)    Finger abd (T1)    Grossly     (Blank rows = not tested, score listed is out of 5 possible points.  N = WNL, D = diminished, C = clear for gross  weakness with myotome testing, * = concordant pain with testing)  PATIENT SURVEYS:  ODI: 47/50 pts   TODAY'S TREATMENT:  Manual therapy: Skilled palpation to identify trigger points for TDN STM to all listed muscles following TDN  Trigger Point Dry Needling  Initial Treatment: Pt instructed on Dry Needling rational, procedures, and possible side effects. Pt instructed to expect mild to moderate muscle soreness later in the day and/or into the next day.  Pt instructed in methods to reduce muscle soreness. Because Dry Needling was performed over or adjacent to a lung field, pt was educated on S/S of pneumothorax and to seek immediate medical attention should they occur.  Patient was educated on signs and symptoms of infection and other risk  factors and advised to seek medical attention should they occur.  Patient verbalized understanding of these instructions and education.   Patient Verbal Consent Given: Yes Education Handout Provided: No   Muscles Treated: bil  Electrical Stimulation Performed: No Treatment Response/Outcome: twitch     PATIENT EDUCATION ([place]/[facility]):  POC, diagnosis, prognosis, HEP, and outcome measures.  Pt educated via explanation, demonstration, and handout (HEP).  Pt confirms understanding verbally.    HOME EXERCISE PROGRAM: None provided  Treatment priorities   Eval                                                  ASSESSMENT:  CLINICAL IMPRESSION: Davine is a 53 y.o. male who presents to clinic with signs and sxs consistent with severe chronic neck, thoracic, and low back pain.  He underwent ACDF 9/24 and has had significant residual pain and tightness following.  He has expected extremely limited cervical ROM.  We trialed TDN today and will monitor efficacy next visit.  He may be a candidate for aquatic therapy depending on how he does on land.  Yves will benefit from skilled PT to address relevant deficits and improve comfort with daily activities     OBJECTIVE IMPAIRMENTS: Pain, cervical ROM, shoulder ROM, lumbar ROM  ACTIVITY LIMITATIONS: reaching, driving, turning head, lifting, housework, self care  PERSONAL FACTORS: See medical history and pertinent history   REHAB POTENTIAL: Fair see past medical history  CLINICAL DECISION MAKING: Evolving/moderate complexity  EVALUATION COMPLEXITY: Moderate   GOALS:   SHORT TERM GOALS: Target date: 09/04/2023  Gertrude will be >75% HEP compliant to improve carryover between sessions and facilitate independent management of condition  Evaluation: ongoing Goal status: INITIAL   LONG TERM GOALS: Target date: 10/02/2023   Daevon will self report >/= 50% decrease in pain from evaluation to improve function in daily tasks  Evaluation/Baseline: 8/10 max pain Goal status: INITIAL   2.  Ronni will show a >/= 17 pt improvement in their ODI score (MCID is 12% or 6/50 pts) as a proxy for functional improvement   Evaluation/Baseline: 47 pts Goal status: INITIAL   3.  Vershawn will report significant improvement in ability to complete daily tasks involving use of UE, not limited by pain  Evaluation/Baseline: limited Goal status: INITIAL   4.  Margaret will report confidence in self management of condition at time of discharge with advanced HEP  Evaluation/Baseline: unable to self manage Goal status: INITIAL  5. Kolter will improve the following MMTs to >/= 3+/5 in available range to show improvement in strength:  shoulder flexion   Evaluation/Baseline: see chart in note Goal status: INITIAL    PLAN: PT FREQUENCY: 1-2x/week  PT DURATION: 8 weeks  PLANNED INTERVENTIONS:  [id number]- PT Re-evaluation, 97110-Therapeutic exercises, 97530- Therapeutic activity, [id number]- Neuromuscular re-education, 97535- Self Care, 45409- Manual therapy, [id number]- Gait training, [id number]- Aquatic Therapy, [id number]- Electrical stimulation (manual), [id number]- Vasopneumatic device, [id number]- Traction (mechanical), [id number]- Ionotophoresis  4mg /ml Dexamethasone , Taping, Dry Needling, Joint manipulation, and Spinal manipulation.   Kailen Hinkle PT, DPT 08/07/2023, 10:05 AM

## 2023-08-06 ENCOUNTER — Ambulatory Visit: Attending: Student | Admitting: Physical Therapy

## 2023-08-06 ENCOUNTER — Other Ambulatory Visit: Payer: Self-pay

## 2023-08-06 ENCOUNTER — Encounter: Payer: Self-pay | Admitting: Physical Therapy

## 2023-08-06 DIAGNOSIS — R2681 Unsteadiness on feet: Secondary | ICD-10-CM

## 2023-08-06 DIAGNOSIS — M542 Cervicalgia: Secondary | ICD-10-CM | POA: Insufficient documentation

## 2023-08-06 DIAGNOSIS — M5459 Other low back pain: Secondary | ICD-10-CM | POA: Diagnosis present

## 2023-08-06 DIAGNOSIS — M6281 Muscle weakness (generalized): Secondary | ICD-10-CM | POA: Diagnosis present

## 2023-08-06 DIAGNOSIS — R42 Dizziness and giddiness: Secondary | ICD-10-CM

## 2023-08-07 ENCOUNTER — Encounter: Payer: Self-pay | Admitting: Physical Therapy

## 2023-08-19 ENCOUNTER — Ambulatory Visit: Attending: Student | Admitting: Physical Therapy

## 2023-08-19 DIAGNOSIS — M542 Cervicalgia: Secondary | ICD-10-CM | POA: Diagnosis present

## 2023-08-19 DIAGNOSIS — M5459 Other low back pain: Secondary | ICD-10-CM | POA: Diagnosis present

## 2023-08-19 DIAGNOSIS — M6281 Muscle weakness (generalized): Secondary | ICD-10-CM | POA: Insufficient documentation

## 2023-08-19 NOTE — Therapy (Addendum)
 OUTPATIENT PHYSICAL THERAPY TREATMENT   Patient Name: Dillon Macdonald MRN: [id number] DOB:January 16, 1971, 53 y.o., male Today's Date: 08/19/2023   PT End of Session - 08/19/23 1159     Visit Number 2   Date for PT Re-Evaluation 10/02/23    PT Start Time 1100    PT Stop Time 1152    PT Time Calculation (min) 52 min    Activity Tolerance Patient tolerated treatment well;Patient limited by pain    Behavior During Therapy [facility] for tasks assessed/performed              Past Medical History:  Diagnosis Date   ADHD (attention deficit hyperactivity disorder)    Arthritis    Atypical chest pain 01/17/2016   Back pain    GI bleed 07/29/2022   due to gastric Dieulafoy lesion, s/p epi injection, cauterization 07/30/22   Herpes simplex    Hypogonadism male    Shortness of breath    pt denies (he said he experienced this in the past but it was r/t anxiety)   Thrombocytopenia (HCC) 2014   Tobacco abuse 05/18/2016   Past Surgical History:  Procedure Laterality Date   ANTERIOR CERVICAL DECOMP/DISCECTOMY FUSION N/A 12/28/2022   Procedure: Anterior Cervical Decompression Fusion - Cervical Three-Cervical Four - Cervical Four-Cervical Five - Cervical Five-Cervical Six with removal of anterior plate at Cervical Six-Seven;  Surgeon: Agustina Aldrich, MD;  Location: [facility] OR;  Service: Neurosurgery;  Laterality: N/A;   BACK SURGERY     lumbar fusion L3- 4, S 1   CERVICAL FUSION  2014   ESOPHAGOGASTRODUODENOSCOPY N/A 07/29/2022   Procedure: ESOPHAGOGASTRODUODENOSCOPY (EGD);  Surgeon: Albertina Hugger, MD;  Location: [facility] ENDOSCOPY;  Service: Gastroenterology;  Laterality: N/A;   ESOPHAGOGASTRODUODENOSCOPY N/A 07/30/2022   Procedure: ESOPHAGOGASTRODUODENOSCOPY (EGD);  Surgeon: Nannette Babe, MD;  Location: [facility] ENDOSCOPY;  Service: Gastroenterology;  Laterality: N/A;   FOREIGN BODY REMOVAL  07/29/2022   Procedure: FOREIGN BODY REMOVAL;  Surgeon: Albertina Hugger, MD;  Location: [facility] ENDOSCOPY;  Service:  Gastroenterology;;   HOT HEMOSTASIS N/A 07/30/2022   Procedure: HOT HEMOSTASIS (ARGON PLASMA COAGULATION/BICAP);  Surgeon: Nannette Babe, MD;  Location: [facility] ENDOSCOPY;  Service: Gastroenterology;  Laterality: N/A;   POSTERIOR CERVICAL LAMINECTOMY Left 07/27/2022   Procedure: Microdiscectomy - left Cervical seven-Thoracic one;  Surgeon: Agustina Aldrich, MD;  Location: [facility] OR;  Service: Neurosurgery;  Laterality: Left;   SUBMUCOSAL INJECTION  07/30/2022   Procedure: SUBMUCOSAL INJECTION;  Surgeon: Nannette Babe, MD;  Location: [facility] ENDOSCOPY;  Service: Gastroenterology;;   VASECTOMY     Patient Active Problem List   Diagnosis Date Noted   Cervical spondylosis with myelopathy and radiculopathy 12/28/2022   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 07/30/2022   Upper GI bleed 07/29/2022   Hematemesis with nausea 07/29/2022   Blood loss anemia 07/29/2022   Melena 07/29/2022   Cervical radiculopathy 07/27/2022   Tobacco abuse 05/18/2016   Atypical chest pain 01/17/2016   Hypogonadism male    Spinal stenosis, lumbar region, with neurogenic claudication 11/06/2013   Lumbar stenosis with neurogenic claudication 11/06/2013   Thrombocytopenia, unspecified (HCC) 12/21/2012    PCP: Pcp, No  REFERRING PROVIDER: Henreitta Locus D, NP  THERAPY DIAG:  Cervicalgia  Other low back pain  Muscle weakness  REFERRING DIAG: Radiculopathy, cervical region [M54.12]   Rationale for Evaluation and Treatment:  Rehabilitation  SUBJECTIVE:  PERTINENT PAST HISTORY:  ACDF C3-C6 9/24, Multiple lumbar surgeries including fusion from T1 - S1  PRECAUTIONS: see PMH  WEIGHT BEARING RESTRICTIONS No  FALLS:  Has patient fallen in last 6 months? No, Number of falls: 0  MOI/History of condition:  Onset date: 9/24  SUBJECTIVE STATEMENT 08/19/2023 " I am still having soreness in the L shoulder and N/T into the L elbow/ wrist/ hand along the pinky side of my pinky."   Evaluation Dillon Macdonald is  a 53 y.o. male who presents to clinic with chief complaint of neck pain and tightness following ACDF C3-C6 in 9/24.  He states he has had multiple lumbar surgeries as well following vertebral fracture many years ago.  His neck and UT feel locked up and tight.  He had n/t in his L fingers 4 and 5.  He feels like L trap has a knot like a tennis ball.  Pain:  Are you having pain? Yes Pain location: neck bil UT NPRS scale:  5- Aggravating factors: neck movements Relieving factors: rest Pain description: constant Stage: Chronic  Occupation: unable  Assistive Device: scooter, FWW  Hand Dominance: R  Patient Goals/Specific Activities: reduce pain   OBJECTIVE:   DIAGNOSTIC FINDINGS:  None recent  GENERAL OBSERVATION: Rounded shoulders - fwd head     SENSATION: Light touch: Deficits L digits 4-5   PALPATION: Significant TTP throughout cervical and upper thoracic musculature including bil UT  Cervical ROM  ROM ROM  (Eval)  Flexion 10*  Extension 10*  Right lateral flexion 15*  Left lateral flexion 15*  Right rotation 20*  Left rotation 20*  Flexion rotation (normal is 30 degrees)   Flexion rotation (normal is 30 degrees)     (Blank rows = not tested, N = WNL, * = concordant pain)  UPPER EXTREMITY AROM:  ROM Right (Eval) Left (Eval)  Shoulder flexion 90* 60*  Shoulder abduction    Shoulder internal rotation    Shoulder external rotation    Functional IR    Functional ER    Shoulder extension    Elbow extension    Elbow flexion     (Blank rows = not tested, N = WNL, * = concordant pain with testing)     UPPER EXTREMITY MMT:  MMT Right (Eval) Left (Eval)  Shoulder flexion 3+* 2+*  Shoulder abduction (C5)    Shoulder ER    Shoulder IR    Middle trapezius    Lower trapezius    Shoulder extension    Grip strength    Shoulder shrug (C4)    Elbow flexion (C6)    Elbow ext (C7)    Thumb ext (C8)    Finger abd (T1)    Grossly     (Blank rows = not  tested, score listed is out of 5 possible points.  N = WNL, D = diminished, C = clear for gross weakness with myotome testing, * = concordant pain with testing)  PATIENT SURVEYS:  ODI: 47/50 pts   TODAY'S TREATMENT:  [facility] Adult PT Treatment:                                                DATE: 08/19/23 MTPR along the L upper trap, scalenes and pec minor Sub-occipital release  Tack and stretch of sub-occipitals Chin tuck  Seated modified pec stretch with scapular retraction  Seated ulnar nerve glides  Reviewed posture and benefits of efficient posture.  Provide  HEP  Tx at eval Manual therapy: Skilled palpation to identify trigger points for TDN STM to all listed muscles following TDN  Trigger Point Dry Needling  Initial Treatment: Pt instructed on Dry Needling rational, procedures, and possible side effects. Pt instructed to expect mild to moderate muscle soreness later in the day and/or into the next day.  Pt instructed in methods to reduce muscle soreness. Because Dry Needling was performed over or adjacent to a lung field, pt was educated on S/S of pneumothorax and to seek immediate medical attention should they occur.  Patient was educated on signs and symptoms of infection and other risk factors and advised to seek medical attention should they occur.  Patient verbalized understanding of these instructions and education.   Patient Verbal Consent Given: Yes Education Handout Provided: No   Muscles Treated: bil  Electrical Stimulation Performed: No Treatment Response/Outcome: twitch     PATIENT EDUCATION ([place]/HM):  POC, diagnosis, prognosis, HEP, and outcome measures.  Pt educated via explanation, demonstration, and handout (HEP).  Pt confirms understanding verbally.    HOME EXERCISE PROGRAM: Access Code: [email or web address] URL: [email or web address] Date: 08/19/2023 Prepared by: Laron Plummer  Exercises - Supine Chin Tuck with Towel  - 1 x daily - 7 x weekly -  2 sets - 10 reps - 5 hold - 3 Finger Cervical Rotation  - 1 x daily - 7 x weekly - 2 sets - 10 reps - Standing Ulnar Nerve Glide  - 1 x daily - 7 x weekly - 2 sets - 10 reps - Seated Upper Trapezius Stretch  - 1 x daily - 7 x weekly - 2 sets - 2 reps - 30 seconds hold - Seated Chest Stretch with Hands Behind Head  - 1 x daily - 7 x weekly - 2 sets - 10 reps  Treatment priorities   Eval                                                  ASSESSMENT:  CLINICAL IMPRESSION: 08/19/2023 Dontavion presents to PT today noting limited changes since his evaluation. He noted the DN made him sore but he couldn't tell if there was any improvement following. Opted to hold off on TPDN today and performed manual trigger point release / sub-occipital release which he noted relief of tension. Worked on modified pec stretch and ulnar nerve glides and he  noted some reduction in his LUE.   Evaluation Obinna is a 53 y.o. male who presents to clinic with signs and sxs consistent with severe chronic neck, thoracic, and low back pain.  He underwent ACDF 9/24 and has had significant residual pain and tightness following.  He has expected extremely limited cervical ROM.  We trialed TDN today and will monitor efficacy next visit.  He may be a candidate for aquatic therapy depending on how he does on land.  Jiyan will benefit from skilled PT to address relevant deficits and improve comfort with daily activities    OBJECTIVE IMPAIRMENTS: Pain, cervical ROM, shoulder ROM, lumbar ROM  ACTIVITY LIMITATIONS: reaching, driving, turning head, lifting, housework, self care  PERSONAL FACTORS: See medical history and pertinent history   REHAB POTENTIAL: Fair see past medical history  CLINICAL DECISION MAKING: Evolving/moderate complexity  EVALUATION COMPLEXITY: Moderate   GOALS:   SHORT TERM GOALS: Target date: 09/04/2023  Marquet will be >75%  HEP compliant to improve carryover between sessions and facilitate independent  management of condition  Evaluation: ongoing Goal status: INITIAL   LONG TERM GOALS: Target date: 10/02/2023   Stacie will self report >/= 50% decrease in pain from evaluation to improve function in daily tasks  Evaluation/Baseline: 8/10 max pain Goal status: INITIAL   2.  Kerim will show a >/= 17 pt improvement in their ODI score (MCID is 12% or 6/50 pts) as a proxy for functional improvement   Evaluation/Baseline: 47 pts Goal status: INITIAL   3.  Chawn will report significant improvement in ability to complete daily tasks involving use of UE, not limited by pain  Evaluation/Baseline: limited Goal status: INITIAL   4.  Kole will report confidence in self management of condition at time of discharge with advanced HEP  Evaluation/Baseline: unable to self manage Goal status: INITIAL  5. Neco will improve the following MMTs to >/= 3+/5 in available range to show improvement in strength:  shoulder flexion   Evaluation/Baseline: see chart in note Goal status: INITIAL    PLAN: PT FREQUENCY: 1-2x/week  PT DURATION: 8 weeks  PLANNED INTERVENTIONS:  97164- PT Re-evaluation, 97110-Therapeutic exercises, 97530- Therapeutic activity, [id number]- Neuromuscular re-education, 97535- Self Care, 16109- Manual therapy, [id number]- Gait training, [id number]- Aquatic Therapy, [id number]- Electrical stimulation (manual), [id number]- Vasopneumatic device, [id number]- Traction (mechanical), [id number]- Ionotophoresis 4mg /ml Dexamethasone , Taping, Dry Needling, Joint manipulation, and Spinal manipulation.  Next session: addressing posterior chain weakness/ pec tightness, response to sub-occipital release, and ulnar nerve glides.   Hartley Line PT, DTP,LAT,ATC 08/19/2023, 12:06 PM   Laron Plummer PT, DPT, LAT, ATC  08/24/23  8:18 AM      Laron Plummer PT, DPT, LAT, ATC  08/24/23  3:27 PM

## 2023-08-24 ENCOUNTER — Encounter: Payer: Self-pay | Admitting: Physical Therapy

## 2023-08-24 ENCOUNTER — Ambulatory Visit: Admitting: Physical Therapy

## 2023-08-24 DIAGNOSIS — M6281 Muscle weakness (generalized): Secondary | ICD-10-CM

## 2023-08-24 DIAGNOSIS — M542 Cervicalgia: Secondary | ICD-10-CM | POA: Diagnosis not present

## 2023-08-24 DIAGNOSIS — M5459 Other low back pain: Secondary | ICD-10-CM

## 2023-08-24 NOTE — Therapy (Signed)
 OUTPATIENT PHYSICAL THERAPY TREATMENT   Patient Name: Dillon Macdonald MRN: [id number] DOB:[birth year], 53 y.o., male Today's Date: 08/24/2023   PT End of Session - 08/24/23 1527     Visit Number 3    Date for PT Re-Evaluation 10/02/23    PT Start Time 1419    PT Stop Time 1518    PT Time Calculation (min) 59 min    Activity Tolerance Patient tolerated treatment well;Patient limited by pain    Behavior During Therapy [facility] for tasks assessed/performed               Past Medical History:  Diagnosis Date   ADHD (attention deficit hyperactivity disorder)    Arthritis    Atypical chest pain 01/17/2016   Back pain    GI bleed 07/29/2022   due to gastric Dieulafoy lesion, s/p epi injection, cauterization 07/30/22   Herpes simplex    Hypogonadism male    Shortness of breath    pt denies (he said he experienced this in the past but it was r/t anxiety)   Thrombocytopenia (HCC) 2014   Tobacco abuse 05/18/2016   Past Surgical History:  Procedure Laterality Date   ANTERIOR CERVICAL DECOMP/DISCECTOMY FUSION N/A 12/28/2022   Procedure: Anterior Cervical Decompression Fusion - Cervical Three-Cervical Four - Cervical Four-Cervical Five - Cervical Five-Cervical Six with removal of anterior plate at Cervical Six-Seven;  Surgeon: Agustina Aldrich, MD;  Location: [facility] OR;  Service: Neurosurgery;  Laterality: N/A;   BACK SURGERY     lumbar fusion L3- 4, S 1   CERVICAL FUSION  2014   ESOPHAGOGASTRODUODENOSCOPY N/A 07/29/2022   Procedure: ESOPHAGOGASTRODUODENOSCOPY (EGD);  Surgeon: Albertina Hugger, MD;  Location: [facility] ENDOSCOPY;  Service: Gastroenterology;  Laterality: N/A;   ESOPHAGOGASTRODUODENOSCOPY N/A 07/30/2022   Procedure: ESOPHAGOGASTRODUODENOSCOPY (EGD);  Surgeon: Nannette Babe, MD;  Location: [facility] ENDOSCOPY;  Service: Gastroenterology;  Laterality: N/A;   FOREIGN BODY REMOVAL  07/29/2022   Procedure: FOREIGN BODY REMOVAL;  Surgeon: Albertina Hugger, MD;  Location: [facility] ENDOSCOPY;  Service:  Gastroenterology;;   HOT HEMOSTASIS N/A 07/30/2022   Procedure: HOT HEMOSTASIS (ARGON PLASMA COAGULATION/BICAP);  Surgeon: Nannette Babe, MD;  Location: [facility] ENDOSCOPY;  Service: Gastroenterology;  Laterality: N/A;   POSTERIOR CERVICAL LAMINECTOMY Left 07/27/2022   Procedure: Microdiscectomy - left Cervical seven-Thoracic one;  Surgeon: Agustina Aldrich, MD;  Location: 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  REFERRING DIAG: Radiculopathy, cervical region [M54.12]   Rationale for Evaluation and Treatment:  Rehabilitation  SUBJECTIVE:  PERTINENT PAST HISTORY:  ACDF C3-C6 9/24, Multiple lumbar surgeries including fusion from T1 - S1  PRECAUTIONS: see PMH  WEIGHT BEARING RESTRICTIONS No  FALLS:  Has patient fallen in last 6 months? No, Number of falls: 0  MOI/History of condition:  Onset date: 9/24  SUBJECTIVE STATEMENT 08/24/2023 "I am having more back pain today at 6/10 in the back"   Evaluation Dillon Macdonald is a 53 y.o. male who presents to clinic with chief complaint of neck pain and tightness following  ACDF C3-C6 in 9/24.  He states he has had multiple lumbar surgeries as well following vertebral fracture many years ago.  His neck and UT feel locked up and tight.  He had n/t in his L fingers 4 and 5.  He feels like L trap has a knot like a tennis ball.  Pain:  Are you having pain? Yes Pain location: neck bil UT NPRS scale:  5- Aggravating factors: neck movements Relieving factors: rest Pain description: constant Stage: Chronic  Occupation: unable  Assistive Device: scooter, FWW  Hand Dominance: R  Patient Goals/Specific Activities: reduce pain   OBJECTIVE:   DIAGNOSTIC FINDINGS:  None recent  GENERAL OBSERVATION: Rounded shoulders - fwd head     SENSATION: Light touch: Deficits L digits 4-5   PALPATION: Significant TTP throughout cervical and upper thoracic musculature including bil UT  Cervical ROM  ROM ROM  (Eval)  Flexion 10*  Extension 10*  Right lateral flexion 15*  Left lateral flexion 15*  Right rotation 20*  Left rotation 20*  Flexion rotation (normal is 30 degrees)   Flexion rotation (normal is 30 degrees)     (Blank rows = not tested, N = WNL, * = concordant pain)  UPPER EXTREMITY AROM:  ROM Right (Eval) Left (Eval)  Shoulder flexion 90* 60*  Shoulder abduction    Shoulder internal rotation    Shoulder external rotation    Functional IR    Functional ER    Shoulder extension    Elbow extension    Elbow flexion     (Blank rows = not tested, N = WNL, * = concordant pain with testing)     UPPER EXTREMITY MMT:  MMT Right (Eval) Left (Eval)  Shoulder flexion 3+* 2+*  Shoulder abduction (C5)    Shoulder ER    Shoulder IR    Middle trapezius    Lower trapezius    Shoulder extension    Grip strength    Shoulder shrug (C4)    Elbow flexion (C6)    Elbow ext (C7)    Thumb ext (C8)    Finger abd (T1)    Grossly     (Blank rows = not tested, score listed is out of 5 possible points.  N = WNL, D = diminished, C = clear for gross  weakness with myotome testing, * = concordant pain with testing)  PATIENT SURVEYS:  ODI: 47/50 pts   TODAY'S TREATMENT:  [facility] Adult PT Treatment:                                                DATE: 08/24/23 Hamstring stretch PNF contract/ relax of the R  SLR RLE x 10 LAD of the RLE grade III MTPR along the piriformis MTPR for thoracic spine and taught how to use theracane Nu-step L4 x 2 min (halted due to pain) MHP x 8 min in sitting Reviewed posture and lifting mechanics   [facility]  Adult PT Treatment:                                                DATE: 08/19/23 MTPR along the L upper trap, scalenes and pec minor Sub-occipital release  Tack and stretch of sub-occipitals Chin tuck  Seated modified pec stretch with scapular retraction  Seated ulnar nerve glides  Reviewed posture and benefits of efficient posture.  Provide HEP  Tx at eval Manual therapy: Skilled palpation to identify trigger points for TDN STM to all listed muscles following TDN  Trigger Point Dry Needling  Initial Treatment: Pt instructed on Dry Needling rational, procedures, and possible side effects. Pt instructed to expect mild to moderate muscle soreness later in the day and/or into the next day.  Pt instructed in methods to reduce muscle soreness. Because Dry Needling was performed over or adjacent to a lung field, pt was educated on S/S of pneumothorax and to seek immediate medical attention should they occur.  Patient was educated on signs and symptoms of infection and other risk factors and advised to seek medical attention should they occur.  Patient verbalized understanding of these instructions and education.   Patient Verbal Consent Given: Yes Education Handout Provided: No   Muscles Treated: bil  Electrical Stimulation Performed: No Treatment Response/Outcome: twitch     PATIENT EDUCATION ([place]/HM):  POC, diagnosis, prognosis, HEP, and outcome measures.  Pt educated via explanation, demonstration,  and handout (HEP).  Pt confirms understanding verbally.    HOME EXERCISE PROGRAM: Access Code: [email or web address] URL: [email or web address] Date: 08/19/2023 Prepared by: Laron Plummer  Exercises - Supine Chin Tuck with Towel  - 1 x daily - 7 x weekly - 2 sets - 10 reps - 5 hold - 3 Finger Cervical Rotation  - 1 x daily - 7 x weekly - 2 sets - 10 reps - Standing Ulnar Nerve Glide  - 1 x daily - 7 x weekly - 2 sets - 10 reps - Seated Upper Trapezius Stretch  - 1 x daily - 7 x weekly - 2 sets - 2 reps - 30 seconds hold - Seated Chest Stretch with Hands Behind Head  - 1 x daily - 7 x weekly - 2 sets - 10 reps  Treatment priorities   Eval                                                  ASSESSMENT:  CLINICAL IMPRESSION: 5/13/2025John arrives to PT today noting some relief with the neck following last session but notes continued low back pain that is elevated today and over the course of the last 3-4 days with no specific cause. Focus on the lumbar spine today to reduce stress/ impact on the neck. He notes challenges with direct pressure along the lumbar spine requiring modification of exercise and positioning throughout session. Discussed importance of using his legs with lifting mechanics and reduce stress on his back which he noted lifting only with his back as a result of his legs being weaker. Utilized MHP end of session to calm down soreness.    Evaluation Lymon is a 53 y.o. male who presents to clinic with signs and sxs consistent with severe chronic neck, thoracic, and low  back pain.  He underwent ACDF 9/24 and has had significant residual pain and tightness following.  He has expected extremely limited cervical ROM.  We trialed TDN today and will monitor efficacy next visit.  He may be a candidate for aquatic therapy depending on how he does on land.  Mitch will benefit from skilled PT to address relevant deficits and improve comfort with daily activities    OBJECTIVE  IMPAIRMENTS: Pain, cervical ROM, shoulder ROM, lumbar ROM  ACTIVITY LIMITATIONS: reaching, driving, turning head, lifting, housework, self care  PERSONAL FACTORS: See medical history and pertinent history   REHAB POTENTIAL: Fair see past medical history  CLINICAL DECISION MAKING: Evolving/moderate complexity  EVALUATION COMPLEXITY: Moderate   GOALS:   SHORT TERM GOALS: Target date: 09/04/2023  Truc will be >75% HEP compliant to improve carryover between sessions and facilitate independent management of condition  Evaluation: ongoing Goal status: INITIAL   LONG TERM GOALS: Target date: 10/02/2023   Rosco will self report >/= 50% decrease in pain from evaluation to improve function in daily tasks  Evaluation/Baseline: 8/10 max pain Goal status: INITIAL   2.  Saim will show a >/= 17 pt improvement in their ODI score (MCID is 12% or 6/50 pts) as a proxy for functional improvement   Evaluation/Baseline: 47 pts Goal status: INITIAL   3.  Cree will report significant improvement in ability to complete daily tasks involving use of UE, not limited by pain  Evaluation/Baseline: limited Goal status: INITIAL   4.  Blaire will report confidence in self management of condition at time of discharge with advanced HEP  Evaluation/Baseline: unable to self manage Goal status: INITIAL  5. Tajah will improve the following MMTs to >/= 3+/5 in available range to show improvement in strength:  shoulder flexion   Evaluation/Baseline: see chart in note Goal status: INITIAL    PLAN: PT FREQUENCY: 1-2x/week  PT DURATION: 8 weeks  PLANNED INTERVENTIONS:  97164- PT Re-evaluation, 97110-Therapeutic exercises, 97530- Therapeutic activity, [id number]- Neuromuscular re-education, 97535- Self Care, [id number]- Manual therapy, [id number]- Gait training, [id number]- Aquatic Therapy, [id number]- Electrical stimulation (manual), [id number]- Vasopneumatic device, [id number]- Traction (mechanical), [id number]- Ionotophoresis 4mg /ml  Dexamethasone , Taping, Dry Needling, Joint manipulation, and Spinal manipulation.  Next session: addressing posterior chain weakness/ pec tightness, response to sub-occipital release, and ulnar nerve glides.   Avi Archuleta PT, DPT, LAT, ATC  08/24/23  3:29 PM

## 2023-08-26 ENCOUNTER — Ambulatory Visit: Admitting: Physical Therapy

## 2023-08-31 ENCOUNTER — Ambulatory Visit: Admitting: Physical Therapy

## 2023-08-31 ENCOUNTER — Encounter: Payer: Self-pay | Admitting: Physical Therapy

## 2023-08-31 DIAGNOSIS — M6281 Muscle weakness (generalized): Secondary | ICD-10-CM

## 2023-08-31 DIAGNOSIS — M5459 Other low back pain: Secondary | ICD-10-CM

## 2023-08-31 DIAGNOSIS — M542 Cervicalgia: Secondary | ICD-10-CM

## 2023-08-31 NOTE — Patient Instructions (Signed)

## 2023-08-31 NOTE — Therapy (Signed)
 OUTPATIENT PHYSICAL THERAPY TREATMENT   Patient Name: SKYLAR PRIEST MRN: [id number] DOB:[birth year], 53 y.o., male Today's Date: 08/31/2023   PT End of Session - 08/31/23 1142     Visit Number 4    Date for PT Re-Evaluation 10/02/23    PT Start Time 1145    PT Stop Time 1226    PT Time Calculation (min) 41 min    Activity Tolerance Patient tolerated treatment well;Patient limited by pain    Behavior During Therapy [facility] for tasks assessed/performed               Past Medical History:  Diagnosis Date   ADHD (attention deficit hyperactivity disorder)    Arthritis    Atypical chest pain 01/17/2016   Back pain    GI bleed 07/29/2022   due to gastric Dieulafoy lesion, s/p epi injection, cauterization 07/30/22   Herpes simplex    Hypogonadism male    Shortness of breath    pt denies (he said he experienced this in the past but it was r/t anxiety)   Thrombocytopenia (HCC) 2014   Tobacco abuse 05/18/2016   Past Surgical History:  Procedure Laterality Date   ANTERIOR CERVICAL DECOMP/DISCECTOMY FUSION N/A 12/28/2022   Procedure: Anterior Cervical Decompression Fusion - Cervical Three-Cervical Four - Cervical Four-Cervical Five - Cervical Five-Cervical Six with removal of anterior plate at Cervical Six-Seven;  Surgeon: Agustina Aldrich, MD;  Location: [facility] OR;  Service: Neurosurgery;  Laterality: N/A;   BACK SURGERY     lumbar fusion L3- 4, S 1   CERVICAL FUSION  2014   ESOPHAGOGASTRODUODENOSCOPY N/A 07/29/2022   Procedure: ESOPHAGOGASTRODUODENOSCOPY (EGD);  Surgeon: Albertina Hugger, MD;  Location: [facility] ENDOSCOPY;  Service: Gastroenterology;  Laterality: N/A;   ESOPHAGOGASTRODUODENOSCOPY N/A 07/30/2022   Procedure: ESOPHAGOGASTRODUODENOSCOPY (EGD);  Surgeon: Nannette Babe, MD;  Location: [facility] ENDOSCOPY;  Service: Gastroenterology;  Laterality: N/A;   FOREIGN BODY REMOVAL  07/29/2022   Procedure: FOREIGN BODY REMOVAL;  Surgeon: Albertina Hugger, MD;  Location: [facility] ENDOSCOPY;  Service:  Gastroenterology;;   HOT HEMOSTASIS N/A 07/30/2022   Procedure: HOT HEMOSTASIS (ARGON PLASMA COAGULATION/BICAP);  Surgeon: Nannette Babe, MD;  Location: [facility] ENDOSCOPY;  Service: Gastroenterology;  Laterality: N/A;   POSTERIOR CERVICAL LAMINECTOMY Left 07/27/2022   Procedure: Microdiscectomy - left Cervical seven-Thoracic one;  Surgeon: Agustina Aldrich, MD;  Location: [facility] OR;  Service: Neurosurgery;  Laterality: Left;   SUBMUCOSAL INJECTION  07/30/2022   Procedure: SUBMUCOSAL INJECTION;  Surgeon: Nannette Babe, MD;  Location: [facility] ENDOSCOPY;  Service: Gastroenterology;;   VASECTOMY     Patient Active Problem List   Diagnosis Date Noted   Cervical spondylosis with myelopathy and radiculopathy 12/28/2022   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 07/30/2022   Upper GI bleed 07/29/2022   Hematemesis with nausea 07/29/2022   Blood loss anemia 07/29/2022   Melena 07/29/2022   Cervical radiculopathy 07/27/2022   Tobacco abuse 05/18/2016   Atypical chest pain 01/17/2016   Hypogonadism male    Spinal stenosis, lumbar region, with neurogenic claudication 11/06/2013   Lumbar stenosis with neurogenic claudication 11/06/2013   Thrombocytopenia, unspecified (HCC) 12/21/2012    PCP: Pcp, No  REFERRING PROVIDER: Henreitta Locus D, NP  THERAPY DIAG:  Cervicalgia  Other low back pain  Muscle weakness  REFERRING DIAG: Radiculopathy, cervical region [M54.12]   Rationale for Evaluation and Treatment:  Rehabilitation  SUBJECTIVE:  PERTINENT PAST HISTORY:  ACDF C3-C6 9/24, Multiple lumbar surgeries including fusion from T1 - S1  PRECAUTIONS: see PMH  WEIGHT BEARING RESTRICTIONS No  FALLS:  Has patient fallen in last 6 months? No, Number of falls: 0  MOI/History of condition:  Onset date: 9/24  SUBJECTIVE STATEMENT 08/31/2023 Pt reports that he has been using the theracane on his neck which is somewhat helpful.  He has been unable to sit on the tennis balls d/t  pain.   Evaluation CASTER FAYETTE is a 53 y.o. male who presents to clinic with chief complaint of neck pain and tightness following ACDF C3-C6 in 9/24.  He states he has had multiple lumbar surgeries as well following vertebral fracture many years ago.  His neck and UT feel locked up and tight.  He had n/t in his L fingers 4 and 5.  He feels like L trap has a knot like a tennis ball.  Pain:  Are you having pain? Yes Pain location: neck bil UT NPRS scale:  5- Aggravating factors: neck movements Relieving factors: rest Pain description: constant Stage: Chronic  Occupation: unable  Assistive Device: scooter, FWW  Hand Dominance: R  Patient Goals/Specific Activities: reduce pain   OBJECTIVE:   DIAGNOSTIC FINDINGS:  None recent  GENERAL OBSERVATION: Rounded shoulders - fwd head     SENSATION: Light touch: Deficits L digits 4-5   PALPATION: Significant TTP throughout cervical and upper thoracic musculature including bil UT  Cervical ROM  ROM ROM  (Eval)  Flexion 10*  Extension 10*  Right lateral flexion 15*  Left lateral flexion 15*  Right rotation 20*  Left rotation 20*  Flexion rotation (normal is 30 degrees)   Flexion rotation (normal is 30 degrees)     (Blank rows = not tested, N = WNL, * = concordant pain)  UPPER EXTREMITY AROM:  ROM Right (Eval) Left (Eval)  Shoulder flexion 90* 60*  Shoulder abduction    Shoulder internal rotation    Shoulder external rotation    Functional IR    Functional ER    Shoulder extension    Elbow extension    Elbow flexion     (Blank rows = not tested, N = WNL, * = concordant pain with testing)     UPPER EXTREMITY MMT:  MMT Right (Eval) Left (Eval)  Shoulder flexion 3+* 2+*  Shoulder abduction (C5)    Shoulder ER    Shoulder IR    Middle trapezius    Lower trapezius    Shoulder extension    Grip strength    Shoulder shrug (C4)    Elbow flexion (C6)    Elbow ext (C7)    Thumb ext (C8)    Finger abd  (T1)    Grossly     (Blank rows = not tested, score listed is out of 5 possible points.  N = WNL, D = diminished, C = clear for gross weakness with myotome testing, * = concordant pain with testing)  PATIENT SURVEYS:  ODI: 47/50 pts   TODAY'S TREATMENT:  [facility] Adult PT Treatment:                                                DATE: 08/31/23  Therex: UBE 3' fwd - 1' reverse - some anterior shoulder pain L Standing row - RTB Standing shoulder ext - RTB  Therapeutic Activity  Pain education regarding the bodies alarm system, hurt vs harm, benefits of  exercise, graded exposure, pain vs functional improvement  [facility] Adult PT Treatment:                                                DATE: 08/19/23 MTPR along the L upper trap, scalenes and pec minor Sub-occipital release  Tack and stretch of sub-occipitals Chin tuck  Seated modified pec stretch with scapular retraction  Seated ulnar nerve glides  Reviewed posture and benefits of efficient posture.  Provide HEP  Tx at eval Manual therapy: Skilled palpation to identify trigger points for TDN STM to all listed muscles following TDN  Trigger Point Dry Needling  Initial Treatment: Pt instructed on Dry Needling rational, procedures, and possible side effects. Pt instructed to expect mild to moderate muscle soreness later in the day and/or into the next day.  Pt instructed in methods to reduce muscle soreness. Because Dry Needling was performed over or adjacent to a lung field, pt was educated on S/S of pneumothorax and to seek immediate medical attention should they occur.  Patient was educated on signs and symptoms of infection and other risk factors and advised to seek medical attention should they occur.  Patient verbalized understanding of these instructions and education.   Patient Verbal Consent Given: Yes Education Handout Provided: No   Muscles Treated: bil  Electrical Stimulation Performed: No Treatment Response/Outcome:  twitch     PATIENT EDUCATION ([place]/HM):  POC, diagnosis, prognosis, HEP, and outcome measures.  Pt educated via explanation, demonstration, and handout (HEP).  Pt confirms understanding verbally.    HOME EXERCISE PROGRAM: Access Code: [email or web address] URL: [email or web address] Date: 08/31/2023 Prepared by: Lesleigh Rash  Exercises - Supine Chin Tuck with Towel  - 1 x daily - 7 x weekly - 2 sets - 10 reps - 5 hold - Standing Shoulder Row with Anchored Resistance  - 2 x daily - 7 x weekly - 3 sets - 10 reps - Shoulder extension with resistance - Neutral  - 2 x daily - 7 x weekly - 3 sets - 10 reps  Treatment priorities   Eval                                                  ASSESSMENT:  CLINICAL IMPRESSION: 08/31/2023:  Cruze arrives today with report that he has had minimal improvement with PT so far.  We had an in depth discussion described in interventions regarding pain and options for interventions moving forward.  We will trial aquatic therapy next week for global activity.  Updated HEP to focus on light resistance.   Evaluation Dillian is a 54 y.o. male who presents to clinic with signs and sxs consistent with severe chronic neck, thoracic, and low back pain.  He underwent ACDF 9/24 and has had significant residual pain and tightness following.  He has expected extremely limited cervical ROM.  We trialed TDN today and will monitor efficacy next visit.  He may be a candidate for aquatic therapy depending on how he does on land.  Malacki will benefit from skilled PT to address relevant deficits and improve comfort with daily activities    OBJECTIVE IMPAIRMENTS: Pain, cervical ROM, shoulder ROM, lumbar ROM  ACTIVITY LIMITATIONS: reaching, driving, turning head, lifting, housework,  self care  PERSONAL FACTORS: See medical history and pertinent history   REHAB POTENTIAL: Fair see past medical history  CLINICAL DECISION MAKING: Evolving/moderate complexity  EVALUATION  COMPLEXITY: Moderate   GOALS:   SHORT TERM GOALS: Target date: 09/04/2023  Jenesis will be >75% HEP compliant to improve carryover between sessions and facilitate independent management of condition  Evaluation: ongoing Goal status: INITIAL   LONG TERM GOALS: Target date: 10/02/2023   Delvis will self report >/= 50% decrease in pain from evaluation to improve function in daily tasks  Evaluation/Baseline: 8/10 max pain Goal status: INITIAL   2.  Siddarth will show a >/= 17 pt improvement in their ODI score (MCID is 12% or 6/50 pts) as a proxy for functional improvement   Evaluation/Baseline: 47 pts Goal status: INITIAL   3.  Saim will report significant improvement in ability to complete daily tasks involving use of UE, not limited by pain  Evaluation/Baseline: limited Goal status: INITIAL   4.  Kaidon will report confidence in self management of condition at time of discharge with advanced HEP  Evaluation/Baseline: unable to self manage Goal status: INITIAL  5. Rorey will improve the following MMTs to >/= 3+/5 in available range to show improvement in strength:  shoulder flexion   Evaluation/Baseline: see chart in note Goal status: INITIAL    PLAN: PT FREQUENCY: 1-2x/week  PT DURATION: 8 weeks  PLANNED INTERVENTIONS:  [id number]- PT Re-evaluation, 97110-Therapeutic exercises, 97530- Therapeutic activity, [id number]- Neuromuscular re-education, 97535- Self Care, [id number]- Manual therapy, [id number]- Gait training, [id number]- Aquatic Therapy, [id number]- Electrical stimulation (manual), [id number]- Vasopneumatic device, [id number]- Traction (mechanical), [id number]- Ionotophoresis 4mg /ml Dexamethasone , Taping, Dry Needling, Joint manipulation, and Spinal manipulation.  Next session: addressing posterior chain weakness/ pec tightness, response to sub-occipital release, and ulnar nerve glides.   Chaniqua Brisby E Jasey Cortez PT  08/31/23  1:05 PM

## 2023-09-02 ENCOUNTER — Ambulatory Visit: Admitting: Physical Therapy

## 2023-09-09 ENCOUNTER — Ambulatory Visit: Admitting: Physical Therapy

## 2023-11-25 ENCOUNTER — Encounter: Payer: Self-pay | Admitting: Student

## 2023-11-25 ENCOUNTER — Other Ambulatory Visit: Payer: Self-pay | Admitting: Student

## 2023-11-25 DIAGNOSIS — M5412 Radiculopathy, cervical region: Secondary | ICD-10-CM

## 2023-11-25 DIAGNOSIS — M546 Pain in thoracic spine: Secondary | ICD-10-CM

## 2023-11-25 DIAGNOSIS — M5416 Radiculopathy, lumbar region: Secondary | ICD-10-CM

## 2023-12-01 NOTE — Discharge Instructions (Signed)

## 2023-12-03 ENCOUNTER — Ambulatory Visit
Admission: RE | Admit: 2023-12-03 | Discharge: 2023-12-03 | Disposition: A | Discharge status: Home / Self Care | Source: Ambulatory Visit | Attending: Student | Admitting: Student

## 2023-12-03 DIAGNOSIS — M5412 Radiculopathy, cervical region: Secondary | ICD-10-CM

## 2023-12-03 DIAGNOSIS — M5416 Radiculopathy, lumbar region: Secondary | ICD-10-CM

## 2023-12-03 DIAGNOSIS — M546 Pain in thoracic spine: Secondary | ICD-10-CM

## 2023-12-03 MED ORDER — DIAZEPAM 5 MG PO TABS
10.0000 mg | ORAL_TABLET | Freq: Once | ORAL | Status: AC
  Administered 2023-12-03: 10 mg via ORAL

## 2023-12-03 MED ORDER — IOPAMIDOL (ISOVUE-M 300) INJECTION 61%
10.0000 mL | Freq: Once | INTRAMUSCULAR | Status: AC | PRN
  Administered 2023-12-03: 10 mL via INTRATHECAL

## 2023-12-03 MED ORDER — MEPERIDINE HCL 50 MG/ML IJ SOLN: 50.0000 mg | Freq: Once | INTRAMUSCULAR | Status: DC | PRN

## 2023-12-03 MED ORDER — ONDANSETRON HCL 4 MG/2ML IJ SOLN: 4.0000 mg | Freq: Once | INTRAMUSCULAR | Status: DC | PRN

## 2023-12-03 MED ORDER — DIAZEPAM 5 MG PO TABS: 10.0000 mg | ORAL_TABLET | Freq: Once | ORAL | Status: DC
